# Patient Record
Sex: Female | Born: 1955
Health system: Southern US, Community
[De-identification: ages and names within clinical notes are randomized; demographics above are authoritative.]

## PROBLEM LIST (undated history)

## (undated) DIAGNOSIS — M5136 Other intervertebral disc degeneration, lumbar region: Secondary | ICD-10-CM

## (undated) DIAGNOSIS — Z9221 Personal history of antineoplastic chemotherapy: Secondary | ICD-10-CM

## (undated) DIAGNOSIS — F32A Depression, unspecified: Secondary | ICD-10-CM

## (undated) DIAGNOSIS — G8929 Other chronic pain: Secondary | ICD-10-CM

## (undated) DIAGNOSIS — F419 Anxiety disorder, unspecified: Secondary | ICD-10-CM

## (undated) DIAGNOSIS — K219 Gastro-esophageal reflux disease without esophagitis: Secondary | ICD-10-CM

## (undated) DIAGNOSIS — M1990 Unspecified osteoarthritis, unspecified site: Secondary | ICD-10-CM

## (undated) DIAGNOSIS — M5030 Other cervical disc degeneration, unspecified cervical region: Secondary | ICD-10-CM

## (undated) DIAGNOSIS — Z9889 Other specified postprocedural states: Secondary | ICD-10-CM

## (undated) DIAGNOSIS — E039 Hypothyroidism, unspecified: Secondary | ICD-10-CM

## (undated) DIAGNOSIS — M4800 Spinal stenosis, site unspecified: Secondary | ICD-10-CM

## (undated) DIAGNOSIS — M51369 Other intervertebral disc degeneration, lumbar region without mention of lumbar back pain or lower extremity pain: Secondary | ICD-10-CM

## (undated) DIAGNOSIS — M5137 Other intervertebral disc degeneration, lumbosacral region: Secondary | ICD-10-CM

## (undated) DIAGNOSIS — M51379 Other intervertebral disc degeneration, lumbosacral region without mention of lumbar back pain or lower extremity pain: Secondary | ICD-10-CM

## (undated) DIAGNOSIS — G4730 Sleep apnea, unspecified: Secondary | ICD-10-CM

## (undated) DIAGNOSIS — R112 Nausea with vomiting, unspecified: Secondary | ICD-10-CM

## (undated) DIAGNOSIS — E669 Obesity, unspecified: Secondary | ICD-10-CM

## (undated) DIAGNOSIS — F329 Major depressive disorder, single episode, unspecified: Secondary | ICD-10-CM

## (undated) DIAGNOSIS — E785 Hyperlipidemia, unspecified: Secondary | ICD-10-CM

## (undated) DIAGNOSIS — Z923 Personal history of irradiation: Secondary | ICD-10-CM

## (undated) DIAGNOSIS — Z87442 Personal history of urinary calculi: Secondary | ICD-10-CM

## (undated) DIAGNOSIS — C50919 Malignant neoplasm of unspecified site of unspecified female breast: Secondary | ICD-10-CM

## (undated) HISTORY — DX: Unspecified osteoarthritis, unspecified site: M19.90

## (undated) HISTORY — PX: FOOT FOREIGN BODY REMOVAL: SUR1116

## (undated) HISTORY — DX: Major depressive disorder, single episode, unspecified: F32.9

## (undated) HISTORY — DX: Other chronic pain: G89.29

## (undated) HISTORY — DX: Other intervertebral disc degeneration, lumbar region: M51.36

## (undated) HISTORY — DX: Other cervical disc degeneration, unspecified cervical region: M50.30

## (undated) HISTORY — DX: Hyperlipidemia, unspecified: E78.5

## (undated) HISTORY — DX: Depression, unspecified: F32.A

## (undated) HISTORY — DX: Anxiety disorder, unspecified: F41.9

## (undated) HISTORY — PX: CHOLECYSTECTOMY: SHX55

## (undated) HISTORY — PX: ABDOMINAL HYSTERECTOMY: SHX81

## (undated) HISTORY — DX: Malignant neoplasm of unspecified site of unspecified female breast: C50.919

## (undated) HISTORY — DX: Other intervertebral disc degeneration, lumbar region without mention of lumbar back pain or lower extremity pain: M51.369

## (undated) HISTORY — DX: Obesity, unspecified: E66.9

---

## 1998-07-19 ENCOUNTER — Other Ambulatory Visit: Admission: RE | Admit: 1998-07-19 | Discharge: 1998-07-19 | Payer: Self-pay | Admitting: Obstetrics & Gynecology

## 1999-12-22 ENCOUNTER — Other Ambulatory Visit: Admission: RE | Admit: 1999-12-22 | Discharge: 1999-12-22 | Payer: Self-pay | Admitting: Obstetrics & Gynecology

## 2001-03-04 ENCOUNTER — Other Ambulatory Visit: Admission: RE | Admit: 2001-03-04 | Discharge: 2001-03-04 | Payer: Self-pay | Admitting: Family Medicine

## 2002-06-11 ENCOUNTER — Other Ambulatory Visit: Admission: RE | Admit: 2002-06-11 | Discharge: 2002-06-11 | Payer: Self-pay | Admitting: Obstetrics & Gynecology

## 2003-08-21 ENCOUNTER — Other Ambulatory Visit: Admission: RE | Admit: 2003-08-21 | Discharge: 2003-08-21 | Payer: Self-pay | Admitting: Obstetrics & Gynecology

## 2004-01-05 ENCOUNTER — Ambulatory Visit (HOSPITAL_COMMUNITY): Admission: RE | Admit: 2004-01-05 | Discharge: 2004-01-05 | Payer: Self-pay | Admitting: Family Medicine

## 2004-01-12 ENCOUNTER — Ambulatory Visit (HOSPITAL_COMMUNITY): Admission: RE | Admit: 2004-01-12 | Discharge: 2004-01-12 | Payer: Self-pay | Admitting: Family Medicine

## 2004-01-19 ENCOUNTER — Ambulatory Visit (HOSPITAL_COMMUNITY): Admission: RE | Admit: 2004-01-19 | Discharge: 2004-01-19 | Payer: Self-pay | Admitting: Family Medicine

## 2005-03-14 ENCOUNTER — Other Ambulatory Visit: Admission: RE | Admit: 2005-03-14 | Discharge: 2005-03-14 | Payer: Self-pay | Admitting: Obstetrics & Gynecology

## 2005-09-08 ENCOUNTER — Ambulatory Visit (HOSPITAL_COMMUNITY): Admission: RE | Admit: 2005-09-08 | Discharge: 2005-09-08 | Payer: Self-pay | Admitting: Family Medicine

## 2006-06-11 ENCOUNTER — Ambulatory Visit: Payer: Self-pay | Admitting: Internal Medicine

## 2006-06-18 ENCOUNTER — Ambulatory Visit (HOSPITAL_COMMUNITY): Admission: RE | Admit: 2006-06-18 | Discharge: 2006-06-18 | Payer: Self-pay | Admitting: Internal Medicine

## 2006-06-18 ENCOUNTER — Ambulatory Visit: Payer: Self-pay | Admitting: Internal Medicine

## 2007-09-06 ENCOUNTER — Encounter: Admission: RE | Admit: 2007-09-06 | Discharge: 2007-09-06 | Payer: Self-pay | Admitting: Obstetrics & Gynecology

## 2010-10-21 ENCOUNTER — Ambulatory Visit (HOSPITAL_COMMUNITY)
Admission: RE | Admit: 2010-10-21 | Discharge: 2010-10-21 | Payer: Self-pay | Source: Home / Self Care | Attending: Internal Medicine | Admitting: Internal Medicine

## 2011-03-10 NOTE — Consult Note (Signed)
NAME:  Tracy Valentine, Tracy Valentine NO.:  [id number]   MEDICAL RECORD NO.:  [id number]           PATIENT TYPE:  AMB   LOCATION:                                FACILITY:  [facility]   PHYSICIAN:  Lionel December, M.D.    DATE OF BIRTH:  03-Dec-1955   DATE OF CONSULTATION:  DATE OF DISCHARGE:                                   CONSULTATION   REQUESTING PHYSICIAN:  Freddy Finner, M.D., at [facility] in [place].   REASON FOR CONSULTATION:  Rectal bleeding.   HISTORY OF PRESENT ILLNESS:  Tracy Valentine is a 55 year old Caucasian female  who states over the last year she has had a large amount of rectal bleeding  with episodes about once or twice a month. She felt initially the bleeding  was vaginal. She has been evaluated by her gynecologist. She is status post  partial hysterectomy. Apparently, a GYN exam was unremarkable. She has been  sent to a urologist as she has had hematuria, and she tells me that she is  being treated for a UTI at this time. She has noticed moderate to large  amounts of puddles of blood in the stool. She has a known history of  hemorrhoids. She states she does a lot of heavy lifting at work. She has  also noticed bright red blood on the toilet paper and streaks with wiping  after defecation. She complains of occasional low abdominal cramp-like pain  and abdominal bloating. She tells me she had a CT scan November of 2006  which was normal through her primary care physician. She has taken 2 to 3  stool softeners a day with a history of chronic constipation but generally  has a bowel movement every other day.   PAST MEDICAL HISTORY:  1. Hypercholesterolemia.  2. Anxiety.  3. Constipation.  4. Chronic UTIs are being followed by urologist.  5. Cholecystectomy.  6. Partial hysterectomy.  7. Appendectomy.   CURRENT MEDICATIONS:  1. Lipitor 5 mg daily.  2. Aspirin 81 mg daily.  3. Ambien 10 mg every night.  4. Stool softeners 2 to 3 every  night.  5. Xanax 1 mg every night.  6. Bactrim DS 1 b.i.d.   ALLERGIES:  No known drug allergies.   FAMILY HISTORY:  There was no known family history of colorectal carcinoma.  She tells me her mother did have a bowel resection due to a possible tumor.  She is unsure of exact condition. Father deceased at age 46 secondary to MI.   SOCIAL HISTORY:  Tracy Valentine has been married for 29 years. She has one  grown healthy child. 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 She denies any tobacco, alcohol or drug use.   REVIEW OF SYSTEMS:  CONSTITUTIONAL:  Weight is stable. Denies any fever or  chills. CARDIOVASCULAR:  Denies any chest pain or palpitations. PULMONARY:  Denies shortness of breath, dyspnea, cough or hemoptysis. GASTROINTESTINAL:  See HPI. GENITOURINARY:  She has a history of chronic UTIs on Bactrim  currently. History of hematuria. GASTROINTESTINAL:  See  HPI. She has  occasional rare heartburn. Denies any anorexia or early satiety. Denies any  indigestion, dysphagia or odynophagia. Denies any nausea or vomiting.  HEMATOLOGIC:  Denies any epistaxis, easy bruising or blood dyscrasias.   PHYSICAL EXAMINATION:  VITAL SIGNS:  Weight 245 pounds, height 68 inches,  temperature 98.7, blood pressure 132/80 and pulse 60.  GENERAL:  Tracy Valentine is 55 year old obese Caucasian female who is alert,  oriented, pleasant and cooperative in no acute distress.  HEENT:  Sclerae are clear, nonicteric. Conjunctivae are pink. Oral mucosa  pink and moist without any lesions.  NECK:  Supple without any masses or thyromegaly.  CHEST:  Heart regular rate and rhythm with normal S1 and S2 without murmurs,  clicks, rubs or gallops.  LUNGS:  Clear to auscultation bilaterally.  ABDOMEN:  Protuberant with positive bowel sounds x4. No bruits auscultated.  Abdomen is soft, multiple striae, nontender without any palpable mass or  hepatosplenomegaly. No rebound tenderness or guarding. Exam is  limited  secondary to patient's body habitus.  EXTREMITIES:  Without clubbing or edema bilaterally.  SKIN:  Pink, warm and dry without any rash or jaundice.   IMPRESSION:  Tracy Valentine is a 55 year old Caucasian female who over the  last year has had intermittent moderate to large amount of blood noticed in  the commode as well as on the toilet tissue. She has had a normal  gynecological exam without any evidence of bleeding. She is status post  partial hysterectomy. She has had a urological exam which has shown  hematuria and chronic UTIs. She is not sure whether this large amount of  bleeding is coming from the GI tract. However, she has noticed blood on the  toilet tissue directly after defecation as well as on the commode. She is  going to need further evaluation with colonoscopy to further delineate  etiology of her bleeding and rule out colorectal carcinoma.   PLAN:  1. Colonoscopy with Dr. Karilyn Cota in the near future. I discussed this      procedure including the risks and benefits which include but are not      limited bleeding, infection, perforation, drug reaction. She agrees      with the plan, and consent will be obtained.  2. Aspirin 81 mg daily will be held for three days prior to the exam.  3. Further recommendations pending colonoscopy.   We would like to thank Dr. Jennette Kettle for allowing [place] to participate in the care  of Tracy Valentine.      Nicholas Lose, N.P.      Lionel December, M.D.  Electronically Signed    KC/MEDQ  D:  06/11/2006  T:  06/12/2006  Job:  [id number]   cc:   Freddy Finner, M.D.  Fax: [phone]

## 2011-03-10 NOTE — Op Note (Signed)
NAME:  Tracy Valentine, Tracy Valentine             ACCOUNT NO.:  [id number]   MEDICAL RECORD NO.:  [id number]          PATIENT TYPE:  AMB   LOCATION:  DAY                           FACILITY:  [facility]   PHYSICIAN:  Lionel December, M.D.    DATE OF BIRTH:  [birth year]   DATE OF PROCEDURE:  06/18/2006  DATE OF DISCHARGE:                                 OPERATIVE REPORT   PROCEDURE:  Colonoscopy.   INDICATION:  [name] is 54 year old Caucasian female with 1/2-year history of  hematochezia.  At times she notices more than just trickle of blood.  She  has had problems with constipation has been using fiber supplement and  Colace and doing better.  Family history is negative for colorectal  carcinoma.  Procedure risks were reviewed the patient, informed consent was  obtained.   MEDS FOR CONSCIOUS SEDATION:  Demerol 50 mg IV Versed 10 mg IV.   FINDINGS:  Procedure performed in endoscopy suite.  The patient's vital  signs and O2 sat were monitored during procedure and remained stable.  The  patient was placed left lateral position.  Rectal examination performed.  No  abnormality noted on external or digital exam.  Olympus videoscope was  placed rectum and advanced under vision into sigmoid colon.  Preparation was  excellent.  Tortuous sigmoid colon and some difficulty encountered in  passing the scope to splenic flexure.  Using abdominal pressure was helpful.  Scope was finally passed into cecum which was identified by appendiceal  orifice and ileocecal valve.  Pictures taken for the record.  As the scope  was withdrawn colonic mucosa was carefully examined.  While there were no  polyps, mass or diverticular changes, there were some fine pigmentation  consistent with melanosis coli involving the most of the colon.  Rectal  mucosa similarly was normal.  Scope was retroflexed to examine anorectal  junction and small hemorrhoids noted below the dentate line.  Endoscope was  then withdrawn.  The patient tolerated the  procedure well.   FINAL DIAGNOSIS:  No evidence of polyps or colitis.  Mild changes of  melanosis coli.  External hemorrhoids.   Suspect she may have intermittent hematochezia secondary to hemorrhoids.   RECOMMENDATIONS:  She will continue fiber supplement and Colace as before.  Next, she will keep symptom diary and if she has frequent episodes of  bleeding.  She will return for OV in 2-3 months.      Lionel December, M.D.  Electronically Signed     NR/MEDQ  D:  06/18/2006  T:  06/19/2006  Job:  [id number]   cc:   Freddy Finner, M.D.  Fax: [phone]   Patrica Duel, M.D.  Fax: [phone]

## 2013-07-22 ENCOUNTER — Encounter: Payer: Self-pay | Admitting: Cardiology

## 2013-07-25 ENCOUNTER — Encounter: Payer: Self-pay | Admitting: Cardiovascular Disease

## 2013-07-25 ENCOUNTER — Ambulatory Visit (INDEPENDENT_AMBULATORY_CARE_PROVIDER_SITE_OTHER): Payer: BC Managed Care – PPO | Admitting: Cardiovascular Disease

## 2013-07-25 VITALS — BP 124/80 | HR 64 | Ht 65.0 in | Wt 249.0 lb

## 2013-07-25 DIAGNOSIS — E039 Hypothyroidism, unspecified: Secondary | ICD-10-CM | POA: Insufficient documentation

## 2013-07-25 DIAGNOSIS — I10 Essential (primary) hypertension: Secondary | ICD-10-CM | POA: Insufficient documentation

## 2013-07-25 DIAGNOSIS — R079 Chest pain, unspecified: Secondary | ICD-10-CM | POA: Insufficient documentation

## 2013-07-25 DIAGNOSIS — Z136 Encounter for screening for cardiovascular disorders: Secondary | ICD-10-CM

## 2013-07-25 DIAGNOSIS — F419 Anxiety disorder, unspecified: Secondary | ICD-10-CM | POA: Insufficient documentation

## 2013-07-25 DIAGNOSIS — F411 Generalized anxiety disorder: Secondary | ICD-10-CM
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
## 2013-07-25 NOTE — Patient Instructions (Signed)
Your physician has requested that you have an echocardiogram. Echocardiography is a painless test that uses sound waves to create images of your heart. It provides your doctor with information about the size and shape of your heart and how well your heart's chambers and valves are working. This procedure takes approximately one hour. There are no restrictions for this procedure. Continue all current medications. Follow up as needed (based on test results above)

## 2013-08-07 ENCOUNTER — Other Ambulatory Visit (INDEPENDENT_AMBULATORY_CARE_PROVIDER_SITE_OTHER): Payer: BC Managed Care – PPO

## 2013-08-07 ENCOUNTER — Encounter: Payer: Self-pay | Admitting: *Deleted

## 2013-08-07 ENCOUNTER — Other Ambulatory Visit: Payer: Self-pay

## 2013-08-07 DIAGNOSIS — R079 Chest pain, unspecified: Secondary | ICD-10-CM

## 2014-04-01 ENCOUNTER — Other Ambulatory Visit: Payer: Self-pay | Admitting: Obstetrics & Gynecology

## 2014-04-01 DIAGNOSIS — R928 Other abnormal and inconclusive findings on diagnostic imaging of breast: Secondary | ICD-10-CM

## 2014-04-09 ENCOUNTER — Ambulatory Visit
Admission: RE | Admit: 2014-04-09 | Discharge: 2014-04-09 | Disposition: A | Discharge status: Home / Self Care | Payer: Medicare Other | Source: Ambulatory Visit | Attending: Obstetrics & Gynecology | Admitting: Obstetrics & Gynecology

## 2014-04-09 ENCOUNTER — Encounter (INDEPENDENT_AMBULATORY_CARE_PROVIDER_SITE_OTHER): Payer: Self-pay

## 2014-04-09 DIAGNOSIS — R928 Other abnormal and inconclusive findings on diagnostic imaging of breast: Secondary | ICD-10-CM

## 2015-10-24 DIAGNOSIS — C569 Malignant neoplasm of unspecified ovary: Secondary | ICD-10-CM

## 2015-10-24 DIAGNOSIS — C50919 Malignant neoplasm of unspecified site of unspecified female breast: Secondary | ICD-10-CM

## 2015-10-24 DIAGNOSIS — Z9221 Personal history of antineoplastic chemotherapy: Secondary | ICD-10-CM

## 2015-10-24 HISTORY — DX: Malignant neoplasm of unspecified site of unspecified female breast: C50.919

## 2015-10-24 HISTORY — DX: Personal history of antineoplastic chemotherapy: Z92.21

## 2015-10-24 HISTORY — DX: Malignant neoplasm of unspecified ovary: C56.9

## 2015-11-04 ENCOUNTER — Other Ambulatory Visit (HOSPITAL_COMMUNITY): Payer: Self-pay | Admitting: Physical Medicine and Rehabilitation

## 2015-11-04 DIAGNOSIS — M47816 Spondylosis without myelopathy or radiculopathy, lumbar region: Secondary | ICD-10-CM

## 2015-11-17 ENCOUNTER — Ambulatory Visit (HOSPITAL_COMMUNITY)
Admission: RE | Admit: 2015-11-17 | Discharge: 2015-11-17 | Disposition: A | Discharge status: Home / Self Care | Payer: Medicare Other | Source: Ambulatory Visit | Attending: Physical Medicine and Rehabilitation | Admitting: Physical Medicine and Rehabilitation

## 2015-11-17 DIAGNOSIS — M47896 Other spondylosis, lumbar region: Secondary | ICD-10-CM | POA: Diagnosis not present

## 2015-11-17 DIAGNOSIS — M47816 Spondylosis without myelopathy or radiculopathy, lumbar region: Secondary | ICD-10-CM

## 2015-11-17 DIAGNOSIS — M4806 Spinal stenosis, lumbar region: Secondary | ICD-10-CM | POA: Diagnosis not present

## 2015-11-17 DIAGNOSIS — M545 Low back pain: Secondary | ICD-10-CM | POA: Diagnosis present

## 2015-11-30 DIAGNOSIS — J0101 Acute recurrent maxillary sinusitis: Secondary | ICD-10-CM | POA: Diagnosis not present

## 2015-11-30 DIAGNOSIS — J343 Hypertrophy of nasal turbinates: Secondary | ICD-10-CM | POA: Diagnosis not present

## 2015-11-30 DIAGNOSIS — J342 Deviated nasal septum: Secondary | ICD-10-CM | POA: Diagnosis not present

## 2015-12-16 DIAGNOSIS — M47816 Spondylosis without myelopathy or radiculopathy, lumbar region: Secondary | ICD-10-CM | POA: Diagnosis not present

## 2015-12-16 DIAGNOSIS — G894 Chronic pain syndrome: Secondary | ICD-10-CM | POA: Diagnosis not present

## 2015-12-16 DIAGNOSIS — M47812 Spondylosis without myelopathy or radiculopathy, cervical region: Secondary | ICD-10-CM | POA: Diagnosis not present

## 2015-12-16 DIAGNOSIS — Z79891 Long term (current) use of opiate analgesic: Secondary | ICD-10-CM | POA: Diagnosis not present

## 2015-12-16 DIAGNOSIS — M5416 Radiculopathy, lumbar region: Secondary | ICD-10-CM | POA: Diagnosis not present

## 2015-12-21 DIAGNOSIS — J310 Chronic rhinitis: Secondary | ICD-10-CM | POA: Diagnosis not present

## 2015-12-21 DIAGNOSIS — J343 Hypertrophy of nasal turbinates: Secondary | ICD-10-CM | POA: Diagnosis not present

## 2015-12-21 DIAGNOSIS — J342 Deviated nasal septum: Secondary | ICD-10-CM | POA: Diagnosis not present

## 2016-02-10 DIAGNOSIS — M47812 Spondylosis without myelopathy or radiculopathy, cervical region: Secondary | ICD-10-CM | POA: Diagnosis not present

## 2016-02-10 DIAGNOSIS — M47816 Spondylosis without myelopathy or radiculopathy, lumbar region: Secondary | ICD-10-CM | POA: Diagnosis not present

## 2016-02-10 DIAGNOSIS — M1711 Unilateral primary osteoarthritis, right knee: Secondary | ICD-10-CM | POA: Diagnosis not present

## 2016-02-10 DIAGNOSIS — M5416 Radiculopathy, lumbar region: Secondary | ICD-10-CM | POA: Diagnosis not present

## 2016-02-10 DIAGNOSIS — G894 Chronic pain syndrome: Secondary | ICD-10-CM | POA: Diagnosis not present

## 2016-02-28 DIAGNOSIS — Z1389 Encounter for screening for other disorder: Secondary | ICD-10-CM | POA: Diagnosis not present

## 2016-02-28 DIAGNOSIS — R7309 Other abnormal glucose: Secondary | ICD-10-CM | POA: Diagnosis not present

## 2016-02-28 DIAGNOSIS — E039 Hypothyroidism, unspecified: Secondary | ICD-10-CM | POA: Diagnosis not present

## 2016-02-28 DIAGNOSIS — E782 Mixed hyperlipidemia: Secondary | ICD-10-CM | POA: Diagnosis not present

## 2016-02-28 DIAGNOSIS — Z6841 Body Mass Index (BMI) 40.0 and over, adult: Secondary | ICD-10-CM | POA: Diagnosis not present

## 2016-04-05 DIAGNOSIS — M47816 Spondylosis without myelopathy or radiculopathy, lumbar region: Secondary | ICD-10-CM | POA: Diagnosis not present

## 2016-04-05 DIAGNOSIS — M5416 Radiculopathy, lumbar region: Secondary | ICD-10-CM | POA: Diagnosis not present

## 2016-04-05 DIAGNOSIS — M47812 Spondylosis without myelopathy or radiculopathy, cervical region: Secondary | ICD-10-CM | POA: Diagnosis not present

## 2016-04-05 DIAGNOSIS — G894 Chronic pain syndrome: Secondary | ICD-10-CM | POA: Diagnosis not present

## 2016-05-24 DIAGNOSIS — G894 Chronic pain syndrome: Secondary | ICD-10-CM | POA: Diagnosis not present

## 2016-05-24 DIAGNOSIS — M47816 Spondylosis without myelopathy or radiculopathy, lumbar region: Secondary | ICD-10-CM | POA: Diagnosis not present

## 2016-05-24 DIAGNOSIS — M47812 Spondylosis without myelopathy or radiculopathy, cervical region: Secondary | ICD-10-CM | POA: Diagnosis not present

## 2016-05-24 DIAGNOSIS — M5416 Radiculopathy, lumbar region: Secondary | ICD-10-CM | POA: Diagnosis not present

## 2016-05-26 DIAGNOSIS — J302 Other seasonal allergic rhinitis: Secondary | ICD-10-CM | POA: Diagnosis not present

## 2016-05-26 DIAGNOSIS — Z6841 Body Mass Index (BMI) 40.0 and over, adult: Secondary | ICD-10-CM | POA: Diagnosis not present

## 2016-05-26 DIAGNOSIS — J309 Allergic rhinitis, unspecified: Secondary | ICD-10-CM | POA: Diagnosis not present

## 2016-05-26 DIAGNOSIS — J0100 Acute maxillary sinusitis, unspecified: Secondary | ICD-10-CM | POA: Diagnosis not present

## 2016-07-19 DIAGNOSIS — Z1231 Encounter for screening mammogram for malignant neoplasm of breast: Secondary | ICD-10-CM | POA: Diagnosis not present

## 2016-07-19 DIAGNOSIS — Z6841 Body Mass Index (BMI) 40.0 and over, adult: Secondary | ICD-10-CM | POA: Diagnosis not present

## 2016-07-19 DIAGNOSIS — Z01419 Encounter for gynecological examination (general) (routine) without abnormal findings: Secondary | ICD-10-CM | POA: Diagnosis not present

## 2016-07-20 DIAGNOSIS — M47816 Spondylosis without myelopathy or radiculopathy, lumbar region: Secondary | ICD-10-CM | POA: Diagnosis not present

## 2016-07-20 DIAGNOSIS — M47812 Spondylosis without myelopathy or radiculopathy, cervical region: Secondary | ICD-10-CM | POA: Diagnosis not present

## 2016-07-20 DIAGNOSIS — M5416 Radiculopathy, lumbar region: Secondary | ICD-10-CM | POA: Diagnosis not present

## 2016-07-20 DIAGNOSIS — G894 Chronic pain syndrome: Secondary | ICD-10-CM | POA: Diagnosis not present

## 2016-07-24 ENCOUNTER — Other Ambulatory Visit: Payer: Self-pay | Admitting: Obstetrics & Gynecology

## 2016-07-24 DIAGNOSIS — R928 Other abnormal and inconclusive findings on diagnostic imaging of breast: Secondary | ICD-10-CM

## 2016-07-26 ENCOUNTER — Ambulatory Visit
Admission: RE | Admit: 2016-07-26 | Discharge: 2016-07-26 | Disposition: A | Discharge status: Home / Self Care | Payer: Medicare Other | Source: Ambulatory Visit | Attending: Obstetrics & Gynecology | Admitting: Obstetrics & Gynecology

## 2016-07-26 ENCOUNTER — Other Ambulatory Visit: Payer: Self-pay | Admitting: Obstetrics & Gynecology

## 2016-07-26 DIAGNOSIS — R928 Other abnormal and inconclusive findings on diagnostic imaging of breast: Secondary | ICD-10-CM | POA: Diagnosis not present

## 2016-07-26 DIAGNOSIS — W64XXXA Exposure to other animate mechanical forces, initial encounter: Secondary | ICD-10-CM | POA: Diagnosis not present

## 2016-07-26 DIAGNOSIS — I10 Essential (primary) hypertension: Secondary | ICD-10-CM | POA: Diagnosis not present

## 2016-07-26 DIAGNOSIS — N6489 Other specified disorders of breast: Secondary | ICD-10-CM | POA: Diagnosis not present

## 2016-07-26 DIAGNOSIS — Z6841 Body Mass Index (BMI) 40.0 and over, adult: Secondary | ICD-10-CM | POA: Diagnosis not present

## 2016-07-26 DIAGNOSIS — N6320 Unspecified lump in the left breast, unspecified quadrant: Secondary | ICD-10-CM

## 2016-07-26 DIAGNOSIS — L0291 Cutaneous abscess, unspecified: Secondary | ICD-10-CM | POA: Diagnosis not present

## 2016-07-28 ENCOUNTER — Ambulatory Visit
Admission: RE | Admit: 2016-07-28 | Discharge: 2016-07-28 | Disposition: A | Discharge status: Home / Self Care | Payer: Medicare Other | Source: Ambulatory Visit | Attending: Obstetrics & Gynecology | Admitting: Obstetrics & Gynecology

## 2016-07-28 ENCOUNTER — Other Ambulatory Visit: Payer: Self-pay | Admitting: Obstetrics & Gynecology

## 2016-07-28 DIAGNOSIS — N6320 Unspecified lump in the left breast, unspecified quadrant: Secondary | ICD-10-CM

## 2016-07-28 DIAGNOSIS — C50412 Malignant neoplasm of upper-outer quadrant of left female breast: Secondary | ICD-10-CM | POA: Diagnosis not present

## 2016-07-28 DIAGNOSIS — N6489 Other specified disorders of breast: Secondary | ICD-10-CM | POA: Diagnosis not present

## 2016-07-28 DIAGNOSIS — Z170 Estrogen receptor positive status [ER+]: Secondary | ICD-10-CM | POA: Diagnosis not present

## 2016-07-31 ENCOUNTER — Other Ambulatory Visit: Payer: Self-pay | Admitting: Obstetrics & Gynecology

## 2016-07-31 DIAGNOSIS — D493 Neoplasm of unspecified behavior of breast: Secondary | ICD-10-CM

## 2016-08-03 ENCOUNTER — Encounter (HOSPITAL_COMMUNITY): Payer: Self-pay | Admitting: Hematology & Oncology

## 2016-08-03 ENCOUNTER — Encounter (HOSPITAL_COMMUNITY): Payer: Medicare Other | Attending: Hematology & Oncology | Admitting: Hematology & Oncology

## 2016-08-03 VITALS — BP 143/62 | HR 58 | Temp 98.4°F | Resp 18 | Ht 65.0 in | Wt 251.2 lb

## 2016-08-03 DIAGNOSIS — Z170 Estrogen receptor positive status [ER+]: Secondary | ICD-10-CM | POA: Diagnosis not present

## 2016-08-03 DIAGNOSIS — Z23 Encounter for immunization: Secondary | ICD-10-CM

## 2016-08-03 DIAGNOSIS — Z0000 Encounter for general adult medical examination without abnormal findings: Secondary | ICD-10-CM

## 2016-08-03 DIAGNOSIS — C50412 Malignant neoplasm of upper-outer quadrant of left female breast: Secondary | ICD-10-CM | POA: Diagnosis not present

## 2016-08-03 MED ORDER — INFLUENZA VAC SPLIT QUAD 0.5 ML IM SUSY
0.5000 mL | PREFILLED_SYRINGE | Freq: Once | INTRAMUSCULAR | Status: AC
  Administered 2016-08-03: 0.5 mL via INTRAMUSCULAR
  Filled 2016-08-03: qty 0.5

## 2016-08-03 NOTE — Patient Instructions (Signed)
[facility] at [facility] Discharge Instructions  RECOMMENDATIONS MADE BY THE CONSULTANT AND ANY TEST RESULTS WILL BE SENT TO YOUR REFERRING PHYSICIAN.  You saw Dr. Penland today.  Thank you for choosing [facility] at [facility] to provide your oncology and hematology care.  To afford each patient quality time with our provider, please arrive at least 15 minutes before your scheduled appointment time.   Beginning January 23rd 2017 lab work for the Cancer Center will be done in the  Main lab at [facility] on 1st floor. If you have a lab appointment with the Cancer Center please come in thru the  Main Entrance and check in at the main information desk  You need to re-schedule your appointment should you arrive 10 or more minutes late.  We strive to give you quality time with our providers, and arriving late affects you and other patients whose appointments are after yours.  Also, if you no show three or more times for appointments you may be dismissed from the clinic at the providers discretion.     Again, thank you for choosing [facility].  Our hope is that these requests will decrease the amount of time that you wait before being seen by our physicians.       _____________________________________________________________  Should you have questions after your visit to [facility], please contact our office at [phone] between the hours of 8:30 a.m. and 4:30 p.m.  Voicemails left after 4:30 p.m. will not be returned until the following business day.  For prescription refill requests, have your pharmacy contact our office.         Resources For Cancer Patients and their Caregivers ? American Cancer Society: Can assist with transportation, wigs, general needs, runs Look Good Feel Better.        1-888-227-6333 ? Cancer Care: Provides financial assistance, online support groups, medication/co-pay assistance.   1-800-813-HOPE (4673) ? [facility] Assists [place] cancer patients and their families through emotional , educational and financial support.  336-427-4357 ? [place] DSS Where to apply for food stamps, Medicaid and utility assistance. 336-342-1394 ? [facility]: Transportation to medical appointments. 336-347-2287 ? Social Security Administration: May apply for disability if have a Stage IV cancer. 336-342-7796 1-800-772-1213 ? [place] Aging, Disability and Transit Services: Assists with nutrition, care and transit needs. 336-349-2343  Cancer Center Support Programs: @10RELATIVEDAYS@ > Cancer Support Group  2nd Tuesday of the month 1pm-2pm, Journey Room  > Creative Journey  3rd Tuesday of the month 1130am-1pm, Journey Room  > Look Good Feel Better  1st Wednesday of the month 10am-12 noon, Journey Room (Call American Cancer Society to register 1-800-395-5775)    

## 2016-08-03 NOTE — Progress Notes (Signed)
Tracy Valentine presents today for injection per MD orders. Flu Vaccine administered IM in right Upper Arm. Administration without incident. Patient tolerated well.

## 2016-08-03 NOTE — Progress Notes (Signed)
[place]  CONSULT NOTE  Patient Care Team: [employer], MD as PCP - General (Internal Medicine)  CHIEF COMPLAINTS/PURPOSE OF CONSULTATION:  Left breast cancer ER+ PR- HER 2 -    Breast cancer of upper-outer quadrant of left female breast ([employer])   07/26/2016 Mammogram    Area of developing asymmetry with distortion located within the upper-outer quadrant left breast. Tissue sampling via tomosynthesis guided biopsy is recommended and will be scheduled.  RECOMMENDATION: Left breast tomosynthesis guided biopsy. This has been scheduled for 07/28/2016.       07/28/2016 Initial Biopsy    Coil shaped clip corresponds to the asymmetry in the outer left breast. Clip is well positioned at the biopsy site. 2. X shaped clip corresponds to the ASYMMETRY/DISTORTION in the upper-outer quadrant of the left breast. Clip is positioned approximately 1 cm medial to the center of the biopsy cavity      07/28/2016 Pathology Results    Estrogen Receptor: 80%, POSITIVE, MODERATE STAINING INTENSITY Progesterone Receptor: 0%, NEGATIVE Proliferation Marker Ki67: 70% HER 2 negative by FISH Breast, left, needle core biopsy, upper outer quadrant - INVASIVE DUCTAL CARCINOMA, SEE COMMENT. - HIGH GRADE DUCTAL CARCINOMA IN SITU WITH NECROSIS.        HISTORY OF PRESENTING ILLNESS:  Tracy Valentine 60 y.o. female is here for a consultation due to left breast cancer ER+/PR-/HER2-. Ki67 is 70%.   She notes that this was diagnosed on routine mammography. There is an area of architectural distortion left breast upper outer quadrant and a second area in the lower breast. Both areas were biopsied. The area in the left upper outer quadrant was consistent with invasive ductal carcinoma grade 3 ER positive PR negative HER-2/neu negative with a Ki-67 of 70%. Patient denies any history of breast pain, breast mass or nipple discharge. No family history of breast cancer. There is a family  history of ovarian and uterine cancer. She has seen Dr. Brantley Stage in consultation. She denies feeling a mass in her breast.  She will meet with Dr. Brantley Stage again on Monday. She is scheduled for ultrasound of the axillae prior to her appointment.   She notes that she feels her mood is ok. She has a lot of questions and wished to be seen prior to her appointment Monday.    MEDICAL HISTORY:  Past Medical History:  Diagnosis Date  . Anxiety   . Breast cancer ([employer])   . Chronic pain   . DDD (degenerative disc disease), cervical   . DDD (degenerative disc disease), lumbar   . Depression   . Hyperlipidemia   . Hypertension   . Obesity   . Osteoarthritis     SURGICAL HISTORY: Past Surgical History:  Procedure Laterality Date  . CHOLECYSTECTOMY      SOCIAL HISTORY: Social History   Social History  . Marital status: Married    Spouse name: N/A  . Number of children: N/A  . Years of education: N/A   Occupational History  . Not on file.   Social History Main Topics  . Smoking status: Never Smoker  . Smokeless tobacco: Never Used  . Alcohol use No  . Drug use: No  . Sexual activity: Not on file     Comment: married-1 grown son   Other Topics Concern  . Not on file   Social History Narrative  . No narrative on file  1 child; No grandchildren  Never smoked No alcohol Has pets (cat and three dogs)  FAMILY HISTORY: Family History  Problem Relation Age of Onset  . COPD Mother   . Heart attack Father   . Diabetes Sister   . Heart disease Sister   . Kidney cancer Brother   . Breast cancer Cousin   . Kidney failure Sister   . Thyroid disease Sister     Half sister and 2 half brother. 1 full brother. No Breast cancer in immediate family. Paternal cousin had breast cancer. Mother diagnosed with ovarian cancer at 49 years. Father passed from heart attack.   ALLERGIES:  is allergic to amoxicillin; darvocet [propoxyphene n-acetaminophen]; and doxycycline.  MEDICATIONS:    Current Outpatient Prescriptions  Medication Sig Dispense Refill  . ALPRAZolam (XANAX) 1 MG tablet Take 1 mg by mouth 4 (four) times daily as needed for sleep.    [name] Kitchen aspirin 325 MG tablet Take 325 mg by mouth daily.    . Cholecalciferol (VITAMIN D) 2000 UNITS CAPS Take 2,000 Units by mouth daily.    [name] Kitchen docusate sodium (COLACE) 50 MG capsule Take 100 mg by mouth 2 (two) times daily.    . fluticasone (FLONASE) 50 MCG/ACT nasal spray Place 2 sprays into the nose daily.    [name] Kitchen ipratropium (ATROVENT) 0.03 % nasal spray   4  . KRILL OIL PO Take 1 capsule by mouth daily.    . lansoprazole (PREVACID) 15 MG capsule Take 15 mg by mouth 2 (two) times daily.     [name] Kitchen levothyroxine (SYNTHROID, LEVOTHROID) 100 MCG tablet Take 100 mcg by mouth daily.  5  . montelukast (SINGULAIR) 10 MG tablet Take 10 mg by mouth daily.  11  . Oxycodone HCl 10 MG TABS   0  . oxyCODONE-acetaminophen (PERCOCET) 10-325 MG per tablet Take 1 tablet by mouth every 4 (four) hours as needed for pain.    [name] Kitchen senna (SENOKOT) 8.6 MG TABS tablet Take 1 tablet by mouth at bedtime as needed.    . simvastatin (ZOCOR) 20 MG tablet Take 20 mg by mouth every evening.    . venlafaxine (EFFEXOR) 75 MG tablet Take 75 mg by mouth 2 (two) times daily.    [name] Kitchen zolpidem (AMBIEN) 10 MG tablet Take 10 mg by mouth at bedtime.     Current Facility-Administered Medications  Medication Dose Route Frequency Provider Last Rate Last Dose  . Influenza vac split quadrivalent PF (FLUARIX) injection 0.5 mL  0.5 mL Intramuscular Once Patrici Ranks, MD        Review of Systems  Constitutional: Negative.   HENT: Negative.   Eyes: Negative.   Respiratory: Negative.   Cardiovascular: Negative.   Gastrointestinal: Negative.   Genitourinary: Negative.   Musculoskeletal: Negative.   Skin: Negative.   Neurological: Negative.   Endo/Heme/Allergies: Negative.   Psychiatric/Behavioral: Negative.   All other systems reviewed and are negative. 14 point ROS was done  and is otherwise as detailed above or in HPI   PHYSICAL EXAMINATION: ECOG PERFORMANCE STATUS: 0 - Asymptomatic  Vitals:   08/03/16 1658  BP: (!) 143/62  Pulse: (!) 58  Resp: 18  Temp: 98.4 F (36.9 C)   Filed Weights   08/03/16 1658  Weight: 251 lb 3.2 oz (113.9 kg)   Physical Exam  Constitutional: She is oriented to person, place, and time and well-developed, well-nourished, and in no distress.  HENT:  Head: Normocephalic and atraumatic.  Nose: Nose normal.  Mouth/Throat: Oropharynx is clear and moist. No oropharyngeal exudate.  Eyes: Conjunctivae and EOM are normal. Pupils are equal, round,  and reactive to light. Right eye exhibits no discharge. Left eye exhibits no discharge. No scleral icterus.  Neck: Normal range of motion. Neck supple. No tracheal deviation present. No thyromegaly present.  Cardiovascular: Normal rate, regular rhythm and normal heart sounds.  Exam reveals no gallop and no friction rub.   No murmur heard. Pulmonary/Chest: Effort normal and breath sounds normal. She has no wheezes. She has no rales.  Abdominal: Soft. Bowel sounds are normal. She exhibits no distension and no mass. There is no tenderness. There is no rebound and no guarding.  Musculoskeletal: Normal range of motion. She exhibits no edema.  Lymphadenopathy:    She has no cervical adenopathy.  Neurological: She is alert and oriented to person, place, and time. She has normal reflexes. No cranial nerve deficit. Gait normal. Coordination normal.  Skin: Skin is warm and dry. No rash noted.  Psychiatric: Mood, memory, affect and judgment normal.  Nursing note and vitals reviewed. Minor bruising at prior biopsy sites  LABORATORY DATA:  I have reviewed the data as listed No results found for: WBC, HGB, HCT, MCV, PLT CMP  No results found for: NA, K, CL, CO2, GLUCOSE, BUN, CREATININE, CALCIUM, PROT, ALBUMIN, AST, ALT, ALKPHOS, BILITOT, GFRNONAA, GFRAA   RADIOGRAPHIC STUDIES: I have personally  reviewed the radiological images as listed and agreed with the findings in the report.  Study Result   CLINICAL DATA:  Status post 2 stereotactic guided biopsies of the left breast earlier today.  EXAM: DIAGNOSTIC LEFT MAMMOGRAM POST STEREOTACTIC BIOPSY  COMPARISON:  Previous exam(s).  FINDINGS: Mammographic images were obtained following stereotactic guided biopsy of an asymmetry within the outer left breast and an asymmetry/distortion in the upper outer quadrant of the left breast. Coil shaped clip corresponds to the asymmetry in the outer left breast. X shaped clip corresponds to the asymmetry/distortion in the upper-outer quadrant of the left breast.  IMPRESSION: Postprocedure mammogram for clip placement.  1. Coil shaped clip corresponds to the asymmetry in the outer left breast. Clip is well positioned at the biopsy site. 2. X shaped clip corresponds to the ASYMMETRY/DISTORTION in the upper-outer quadrant of the left breast. Clip is positioned approximately 1 cm medial to the center of the biopsy cavity.  Final Assessment: Post Procedure Mammograms for Marker Placement   Electronically Signed   By: Franki Cabot M.D.   On: 07/28/2016 11:54   Addendum   ADDENDUM REPORT: 07/31/2016 13:53  ADDENDUM: Pathology revealed BENIGN BREAST TISSUE of the Left breast, outer. GRADE III INVASIVE DUCTAL CARCINOMA, HIGH GRADE DUCTAL CARCINOMA IN SITU WITH NECROSIS of the Left breast, upper outer quadrant. This was found to be concordant by Dr. Franki Cabot. Pathology results were discussed with the patient by telephone. The patient reported doing well after the biopsies with tenderness at the sites. Post biopsy instructions and care were reviewed and questions were answered. The patient was encouraged to call [facility] for any additional concerns. Surgical consultation has been arranged with Dr. Erroll Luna at [facility]  on August 07, 2016. The patient is scheduled for a Left axillary ultrasound at [facility] on August 07, 2016.  Pathology results reported by Tracy Purser, RN on 07/31/2016.   Electronically Signed   By: Franki Cabot M.D.   On: 07/31/2016 13:53   Addended by Michiel Cowboy, MD on 07/31/2016 2:04 PM    Study Result   CLINICAL DATA:  Patient with a left breast asymmetry/distortion presenting for stereotactic guided  biopsy.  EXAM: LEFT BREAST STEREOTACTIC CORE NEEDLE BIOPSY x2  COMPARISON:  Previous exams.  FINDINGS: The patient and I discussed the procedure of stereotactic-guided biopsy including benefits and alternatives. We discussed the high likelihood of a successful procedure. We discussed the risks of the procedure including infection, bleeding, tissue injury, clip migration, and inadequate sampling. Informed written consent was given. The usual time out protocol was performed immediately prior to the procedure.  Using sterile technique and 1% Lidocaine as local anesthetic, under stereotactic guidance, a 9 gauge vacuum assisted device was used to perform core needle biopsy of an asymmetry in the outer left breast using a superior approach.  At the conclusion of the procedure, a COIL SHAPED tissue marker was placed at the biopsy site. Postprocedure diagnostic mammogram showed the coil shaped clip to correspond to the site of a left breast asymmetry identified on the CC projection, but not corresponding to the additional area of asymmetry/distortion in the upper left breast.  As such, patient was prepped for a second stereotactic guided biopsy for the additional asymmetry/distortion in the upper left breast.  Using sterile technique and 1% lidocaine as local anesthetic, under stereotactic guidance, a 9 gauge vacuum assisted device was used to perform core needle biopsy of the asymmetry/distortion in the upper-outer quadrant of the left breast  using a lateral approach.  At the conclusion of the procedure, an X SHAPED tissue marker was placed at the biopsy site. Postprocedure mammogram showed the X shaped clip to correspond to the site of the ASYMMETRY/DISTORTION in the upper-outer quadrant of the left breast, approximately 1 cm medial to the center of the biopsy cavity.  IMPRESSION: 1. Stereotactic guided biopsy of the asymmetry in the outer left breast. COIL SHAPED tissue marker placed at the biopsy site. 2. Stereotactic guided biopsy of the additional ASYMMETRY/DISTORTION in the upper-outer quadrant of the left breast. X shaped tissue marker placed at the biopsy site. This biopsy clip is approximately 1 cm medial to the center of the biopsy site.  Electronically Signed: By: Franki Cabot M.D. On: 07/28/2016 11:34      PATHOLOGY:    ASSESSMENT & PLAN:  L breast Cancer ER+ PR- HER 2 -  Patient is pleasant and well. We discussed pathology reports. I explained to her that her biopsy revealed invasive ductal carcinoma and DCIS, and I elaborated on what that means. I explained to Tracy Valentine that she will have an ultrasound to determine if there are any enlarged lymph nodes. She understands that even if the ultrasound does not detect enlarged lymph nodes, she will still have a sentinel node biopsy, and we discussed the process for that. Patient and I also discussed the lack of evidence to support mastectomy over lumpectomy in regards to survival if she is a candidate for lumpectomy. She understands that if she is a candidate for breast conservation she will need radiation post operatively.   In addition, we dicussed ER+/PR-/HER- breast cancer. I explained that we may send oncotype or mammoprint after her surgery to determine if she needs chemotherapy. She understands that even if she does not need chemotherapy, she will still have radiation.  We addressed the role of endocrine therapy. We discussed timing of endocrine therapy.     Patient was given a comprehensive breast cancer handbook to take home. She was also given contact information for our patient navigator, Tracy Valentine, and told to contact her if she has any additional questions.   I will talk with Tracy Valentine and determine if Tracy Valentine  is a candidate for genetic counseling, given her mother's history of ovarian cancer.   Follow up with patient post-operatively once pathology is available for further recommendations regarding additional therapy.    This document serves as a record of services personally performed by Ancil Linsey, MD. It was created on her behalf by Elmyra Ricks, a trained medical scribe. The creation of this record is based on the scribe's personal observations and the provider's statements to them. This document has been checked and approved by the attending provider.  I have reviewed the above documentation for accuracy and completeness and I agree with the above.  This note was electronically signed.    Molli Hazard, MD  08/03/2016 5:23 PM

## 2016-08-07 ENCOUNTER — Ambulatory Visit
Admission: RE | Admit: 2016-08-07 | Discharge: 2016-08-07 | Disposition: A | Discharge status: Home / Self Care | Payer: Medicare Other | Source: Ambulatory Visit | Attending: Obstetrics & Gynecology | Admitting: Obstetrics & Gynecology

## 2016-08-07 ENCOUNTER — Other Ambulatory Visit: Payer: Self-pay | Admitting: Obstetrics & Gynecology

## 2016-08-07 ENCOUNTER — Ambulatory Visit: Payer: Self-pay | Admitting: Surgery

## 2016-08-07 DIAGNOSIS — D493 Neoplasm of unspecified behavior of breast: Secondary | ICD-10-CM

## 2016-08-07 DIAGNOSIS — D0512 Intraductal carcinoma in situ of left breast: Secondary | ICD-10-CM | POA: Diagnosis not present

## 2016-08-07 DIAGNOSIS — C50912 Malignant neoplasm of unspecified site of left female breast: Secondary | ICD-10-CM | POA: Diagnosis not present

## 2016-08-07 DIAGNOSIS — L0390 Cellulitis, unspecified: Secondary | ICD-10-CM | POA: Diagnosis not present

## 2016-08-07 NOTE — H&P (Signed)
Tracy Valentine 08/07/2016 2:13 PM Location: [facility] Patient #: [id number] DOB: October 13, 1956 Married / Language: English / Race: White Female  History of Present Illness Tracy Valentine A. Tracy Wojtas MD; 08/07/2016 2:53 PM) Patient words: Patient sent at the request of Dr. Evette Cristal for a left breast mammographic abnormality. This was picked up on screening mammogram. There is an area of architectural distortion left breast upper outer quadrant and a second area in the lower breast. Both areas were biopsied. The area in the left upper outer quadrant was consistent with invasive ductal carcinoma grade 3 ER positive PR negative HER-2/neu negative with a Ki-67 of 70%. Patient denies any history of breast pain, breast mass or nipple discharge. No family history of breast cancer. There is a family history of ovarian and uterine cancer.                       Study Result  CLINICAL DATA: Recall from screening mammography. EXAM: 2D DIGITAL DIAGNOSTIC LEFT MAMMOGRAM WITH CAD AND ADJUNCT TOMO ULTRASOUND LEFT BREAST COMPARISON: Previous exam(s). ACR Breast Density Category b: There are scattered areas of fibroglandular density. FINDINGS: Additional views of the left breast with tomosynthesis demonstrate a persistent area of asymmetry with distortion to be present within the anterior portion of the upper-outer quadrant of the left breast at approximately the 1 to 2 o'clock position. Mammographic images were processed with CAD. On physical exam, there is no discrete palpable abnormality in the area of mammographic concern. Targeted ultrasound is performed, showing no definite ultrasound correlate for the mammographic finding. IMPRESSION: Area of developing asymmetry with distortion located within the upper-outer quadrant left breast. Tissue sampling via tomosynthesis guided biopsy is recommended and will be scheduled. RECOMMENDATION: Left breast tomosynthesis  guided biopsy. This has been scheduled for 07/28/2016. I have discussed the findings and recommendations with the patient. Results were also provided in writing at the conclusion of the visit. If applicable, a reminder letter will be sent to the patient regarding the next appointment. BI-RADS CATEGORY 4: Suspicious. Electronically Signed By: Altamese Cabal M.D. On: 07/26/2016 12:45            ADDITIONAL INFORMATION: 2. FLUORESCENCE IN-SITU HYBRIDIZATION Results: HER2 - NEGATIVE RATIO OF HER2/CEP17 SIGNALS 1.03 AVERAGE HER2 COPY NUMBER PER CELL 2.00 Reference Range: NEGATIVE HER2/CEP17 Ratio <2.0 and average HER2 copy number <4.0 EQUIVOCAL HER2/CEP17 Ratio <2.0 and average HER2 copy number >=4.0 and <6.0 POSITIVE HER2/CEP17 Ratio >=2.0 or <2.0 and average HER2 copy number >=6.0 Claudette Laws MD Pathologist, Electronic Signature ( Signed 08/03/2016) 2. PROGNOSTIC INDICATORS Results: IMMUNOHISTOCHEMICAL AND MORPHOMETRIC ANALYSIS PERFORMED MANUALLY Estrogen Receptor: 80%, POSITIVE, MODERATE STAINING INTENSITY Progesterone Receptor: 0%, NEGATIVE Proliferation Marker Ki67: 70% COMMENT: The negative hormone receptor study(ies) in this case has an internal positive control. REFERENCE RANGE ESTROGEN RECEPTOR NEGATIVE 0% POSITIVE =>1% REFERENCE RANGE PROGESTERONE RECEPTOR 1 of 3 FINAL for Tracy, Valentine ([id number]) ADDITIONAL INFORMATION:(continued) NEGATIVE 0% POSITIVE =>1% All controls stained appropriately Enid Cutter MD Pathologist, Electronic Signature ( Signed 08/01/2016) FINAL DIAGNOSIS Diagnosis 1. Breast, left, needle core biopsy, outer - BENIGN BREAST TISSUE, SEE COMMENT. - NO MALIGNANCY IDENTIFIED. 2. Breast, left, needle core biopsy, upper outer quadrant - INVASIVE DUCTAL CARCINOMA, SEE COMMENT. - HIGH GRADE DUCTAL CARCINOMA IN SITU WITH NECROSIS.  The patient is a 60 year old female.   Other Problems Tracy Valentine, CMA; 08/07/2016 2:13  PM) Anxiety Disorder Arthritis Back Pain Cholelithiasis Depression Gastroesophageal Reflux Disease Hemorrhoids Hypercholesterolemia Kidney Stone Migraine Headache Sleep Apnea  Thyroid Disease  Past Surgical History Tracy Valentine, CMA; 08/07/2016 2:13 PM) Breast Biopsy Left. Gallbladder Surgery - Open Hysterectomy (not due to cancer) - Partial  Diagnostic Studies History Tracy Valentine, CMA; 08/07/2016 2:13 PM) Colonoscopy >10 years ago Mammogram within last year Pap Smear 1-5 years ago  Allergies Tracy Valentine, CMA; 08/07/2016 2:14 PM) Darvocet A500 *ANALGESICS - OPIOID* Azithromycin *CHEMICALS* Amoxicillin *PENICILLINS* Doxycycline *DERMATOLOGICALS*  Medication History Tracy Valentine, CMA; 08/07/2016 2:16 PM) Levothyroxine Sodium (100MCG Tablet, Oral) Active. Montelukast Sodium (10MG Tablet, Oral) Active. Simvastatin (20MG Tablet, Oral) Active. ALPRAZolam (1MG Tablet, Oral) Active. Effexor (75MG Tablet, Oral two times daily) Active. Ambien (10MG Tablet, Oral) Active. OxyCODONE HCl (10MG Tablet, Oral) Active. Aspirin (81MG Tablet, Oral) Active. Vitamin D3 (1000UNIT Tablet, Oral) Active. Stool Softener (100MG Capsule, Oral) Active. Medications Reconciled  Social History Tracy Valentine, [place]; 08/07/2016 2:13 PM) Caffeine use Tea. No alcohol use No drug use Tobacco use Never smoker.  Family History Tracy Valentine, [place]; 08/07/2016 2:13 PM) Alcohol Abuse Mother. Arthritis Father. Cervical Cancer Mother. Depression Mother. Diabetes Mellitus Mother. Heart Disease Father. Ovarian Cancer Mother. Respiratory Condition Mother.  Pregnancy / Birth History Tracy Valentine, CMA; 08/07/2016 2:13 PM) Age at menarche 16 years. Age of menopause 33-50 Gravida 1 Maternal age 51-20 Para 1     Review of Systems Tracy Valentine CMA; 08/07/2016 2:13 PM) General Not Present- Appetite Loss, Chills, Fatigue, Fever, Night Sweats, Weight Gain  and Weight Loss. Skin Not Present- Change in Wart/Mole, Dryness, Hives, Jaundice, New Lesions, Non-Healing Wounds, Rash and Ulcer. HEENT Present- Seasonal Allergies. Not Present- Earache, Hearing Loss, Hoarseness, Nose Bleed, Oral Ulcers, Ringing in the Ears, Sinus Pain, Sore Throat, Visual Disturbances, Wears glasses/contact lenses and Yellow Eyes. Respiratory Present- Snoring. Not Present- Bloody sputum, Chronic Cough, Difficulty Breathing and Wheezing. Breast Not Present- Breast Mass, Breast Pain, Nipple Discharge and Skin Changes. Cardiovascular Not Present- Chest Pain, Difficulty Breathing Lying Down, Leg Cramps, Palpitations, Rapid Heart Rate, Shortness of Breath and Swelling of Extremities. Gastrointestinal Present- Constipation. Not Present- Abdominal Pain, Bloating, Bloody Stool, Change in Bowel Habits, Chronic diarrhea, Difficulty Swallowing, Excessive gas, Gets full quickly at meals, Hemorrhoids, Indigestion, Nausea, Rectal Pain and Vomiting. Female Genitourinary Not Present- Frequency, Nocturia, Painful Urination, Pelvic Pain and Urgency. Musculoskeletal Present- Back Pain and Joint Stiffness. Not Present- Joint Pain, Muscle Pain, Muscle Weakness and Swelling of Extremities. Neurological Present- Tingling. Not Present- Decreased Memory, Fainting, Headaches, Numbness, Seizures, Tremor, Trouble walking and Weakness. Psychiatric Present- Anxiety and Depression. Not Present- Bipolar, Change in Sleep Pattern, Fearful and Frequent crying. Hematology Present- Easy Bruising. Not Present- Blood Thinners, Excessive bleeding, Gland problems, HIV and Persistent Infections.  Vitals Tracy Valentine CMA; 08/07/2016 2:16 PM) 08/07/2016 2:16 PM Weight: 254.8 lb Height: 65in Body Surface Area: 2.19 m Body Mass Index: 42.4 kg/m  Temp.: 97.50F(Temporal)  Pulse: 76 (Regular)  BP: 130/76 (Sitting, Left Arm, Standard)      Physical Exam (Rane Blitch A. Neve Branscomb MD; 08/07/2016 2:53  PM)  General Mental Status-Alert. General Appearance-Consistent with stated age. Hydration-Well hydrated. Voice-Normal.  Head and Neck Head-normocephalic, atraumatic with no lesions or palpable masses. Trachea-midline. Thyroid Gland Characteristics - normal size and consistency.  Eye Eyeball - Bilateral-Extraocular movements intact. Sclera/Conjunctiva - Bilateral-No scleral icterus.  Chest and Lung Exam Chest and lung exam reveals -quiet, even and easy respiratory effort with no use of accessory muscles and on auscultation, normal breath sounds, no adventitious sounds and normal vocal resonance. Inspection Chest Wall - Normal. Back - normal.  Breast Breast - Left-Symmetric, Non Tender,  No Biopsy scars, no Dimpling, No Inflammation, No Lumpectomy scars, No Mastectomy scars, No Peau d' Orange. Breast - Right-Symmetric, Non Tender, No Biopsy scars, no Dimpling, No Inflammation, No Lumpectomy scars, No Mastectomy scars, No Peau d' Orange. Breast Lump-No Palpable Breast Mass.  Cardiovascular Cardiovascular examination reveals -normal heart sounds, regular rate and rhythm with no murmurs and normal pedal pulses bilaterally.  Abdomen Inspection Inspection of the abdomen reveals - No Hernias. Skin - Scar - no surgical scars. Palpation/Percussion Palpation and Percussion of the abdomen reveal - Soft, Non Tender, No Rebound tenderness, No Rigidity (guarding) and No hepatosplenomegaly. Auscultation Auscultation of the abdomen reveals - Bowel sounds normal. Note: Scar noted upper abdomen. Right lower quadrant abdomen shows an area from an insect bite she states that is slightly erythematous. There is no abscess. This happened one week ago.  Neurologic Neurologic evaluation reveals -alert and oriented x 3 with no impairment of recent or remote memory. Mental Status-Normal.  Musculoskeletal Normal Exam - Left-Upper Extremity Strength Normal and Lower  Extremity Strength Normal. Normal Exam - Right-Upper Extremity Strength Normal and Lower Extremity Strength Normal.  Lymphatic Head & Neck  General Head & Neck Lymphatics: Bilateral - Description - Normal. Axillary  General Axillary Region: Bilateral - Description - Normal. Tenderness - Non Tender. Femoral & Inguinal  Generalized Femoral & Inguinal Lymphatics: Bilateral - Description - Normal. Tenderness - Non Tender.    Assessment & Plan (Jaythan Hinely A. Cylee Dattilo MD; 08/07/2016 2:57 PM)  BREAST CANCER, LEFT (C50.912) Impression: Discussed breast conservation versus mastectomy with reconstruction. Discussed the need for central lymph node mapping. Risks, benefits and long-term expectations of each discussed. Recurrence rates and survival statistics discussed. Risk of lumpectomy include bleeding, infection, seroma, more surgery, use of seed/wire, wound care, cosmetic deformity and the need for other treatments, death , blood clots, death. Pt agrees to proceed. Risk of sentinel lymph node mapping include bleeding, infection, lymphedema, shoulder pain. stiffness, dye allergy. cosmetic deformity , blood clots, death, need for more surgery. Pt agres to proceed.  Current Plans You are being scheduled for surgery - Our schedulers will call you.  You should hear from our office's scheduling department within 5 working days about the location, date, and time of surgery. We try to make accommodations for patient's preferences in scheduling surgery, but sometimes the OR schedule or the surgeon's schedule prevents [place] from making those accommodations.  If you have not heard from our office [phone]) in 5 working days, call the office and ask for your surgeon's nurse.  If you have other questions about your diagnosis, plan, or surgery, call the office and ask for your surgeon's nurse.  Pt Education - [facility] Breast Cancer Information Given - [facility] "Breast Journey" Package Pt Education - Pamphlet Given  - Breast Biopsy: discussed with patient and provided information. We discussed the staging and pathophysiology of breast cancer. We discussed all of the different options for treatment for breast cancer including surgery, chemotherapy, radiation therapy, Herceptin, and antiestrogen therapy. We discussed a sentinel lymph node biopsy as she does not appear to having lymph node involvement right now. We discussed the performance of that with injection of radioactive tracer and blue dye. We discussed that she would have an incision underneath her axillary hairline. We discussed that there is a bout a 10-20% chance of having a positive node with a sentinel lymph node biopsy and we will await the permanent pathology to make any other first further decisions in terms of her treatment. One of these  options might be to return to the operating room to perform an axillary lymph node dissection. We discussed about a 1-2% risk lifetime of chronic shoulder pain as well as lymphedema associated with a sentinel lymph node biopsy. We discussed the options for treatment of the breast cancer which included lumpectomy versus a mastectomy. We discussed the performance of the lumpectomy with a wire placement. We discussed a 10-20% chance of a positive margin requiring reexcision in the operating room. We also discussed that she may need radiation therapy or antiestrogen therapy or both if she undergoes lumpectomy. We discussed the mastectomy and the postoperative care for that as well. We discussed that there is no difference in her survival whether she undergoes lumpectomy with radiation therapy or antiestrogen therapy versus a mastectomy. There is a slight difference in the local recurrence rate being 3-5% with lumpectomy and about 1% with a mastectomy. We discussed the risks of operation including bleeding, infection, possible reoperation. She understands her further therapy will be based on what her stages at the time of her  operation.  Pt Education - [facility] breast cancer surgery: discussed with patient and provided information. Pt Education - [facility] Breast Biopsy HCI: discussed with patient and provided information. Pt Education - ABC (After Breast Cancer) Class Info: discussed with patient and provided information. CELLULITIS (L03.90) Impression: doxycycline 100 mg po bid x 10 days  Current Plans Started Bactrim DS 800-160MG, 1 (one) Tablet two times daily, #20, 08/07/2016, No Refill.

## 2016-08-15 ENCOUNTER — Other Ambulatory Visit: Payer: Self-pay | Admitting: Surgery

## 2016-08-15 ENCOUNTER — Encounter (HOSPITAL_BASED_OUTPATIENT_CLINIC_OR_DEPARTMENT_OTHER): Payer: Self-pay | Admitting: *Deleted

## 2016-08-15 DIAGNOSIS — C50912 Malignant neoplasm of unspecified site of left female breast: Secondary | ICD-10-CM

## 2016-08-22 ENCOUNTER — Ambulatory Visit
Admission: RE | Admit: 2016-08-22 | Discharge: 2016-08-22 | Disposition: A | Discharge status: Home / Self Care | Payer: Medicare Other | Source: Ambulatory Visit | Attending: Surgery | Admitting: Surgery

## 2016-08-22 DIAGNOSIS — C50912 Malignant neoplasm of unspecified site of left female breast: Secondary | ICD-10-CM

## 2016-08-22 DIAGNOSIS — R928 Other abnormal and inconclusive findings on diagnostic imaging of breast: Secondary | ICD-10-CM | POA: Diagnosis not present

## 2016-08-22 NOTE — Pre-Procedure Instructions (Signed)
Patient given boost drink with instruction to drink by 0830 on DOS. She voiced understanding.

## 2016-08-24 ENCOUNTER — Ambulatory Visit (HOSPITAL_BASED_OUTPATIENT_CLINIC_OR_DEPARTMENT_OTHER): Payer: Medicare Other | Admitting: Anesthesiology

## 2016-08-24 ENCOUNTER — Encounter (HOSPITAL_BASED_OUTPATIENT_CLINIC_OR_DEPARTMENT_OTHER): Payer: Self-pay | Admitting: Anesthesiology

## 2016-08-24 ENCOUNTER — Encounter (HOSPITAL_BASED_OUTPATIENT_CLINIC_OR_DEPARTMENT_OTHER)
Admission: RE | Disposition: A | Discharge status: Home / Self Care | Payer: Self-pay | Source: Ambulatory Visit | Attending: Surgery

## 2016-08-24 ENCOUNTER — Ambulatory Visit
Admission: RE | Admit: 2016-08-24 | Discharge: 2016-08-24 | Disposition: A | Discharge status: Home / Self Care | Payer: Medicare Other | Source: Ambulatory Visit | Attending: Surgery | Admitting: Surgery

## 2016-08-24 ENCOUNTER — Encounter (HOSPITAL_COMMUNITY)
Admission: RE | Admit: 2016-08-24 | Discharge: 2016-08-24 | Disposition: A | Discharge status: Home / Self Care | Payer: Medicare Other | Source: Ambulatory Visit | Attending: Surgery | Admitting: Surgery

## 2016-08-24 ENCOUNTER — Ambulatory Visit (HOSPITAL_BASED_OUTPATIENT_CLINIC_OR_DEPARTMENT_OTHER)
Admission: RE | Admit: 2016-08-24 | Discharge: 2016-08-24 | Disposition: A | Discharge status: Home / Self Care | Payer: Medicare Other | Source: Ambulatory Visit | Attending: Surgery | Admitting: Surgery

## 2016-08-24 DIAGNOSIS — R928 Other abnormal and inconclusive findings on diagnostic imaging of breast: Secondary | ICD-10-CM | POA: Diagnosis not present

## 2016-08-24 DIAGNOSIS — K649 Unspecified hemorrhoids: Secondary | ICD-10-CM | POA: Diagnosis not present

## 2016-08-24 DIAGNOSIS — Z9049 Acquired absence of other specified parts of digestive tract: Secondary | ICD-10-CM | POA: Diagnosis not present

## 2016-08-24 DIAGNOSIS — Z881 Allergy status to other antibiotic agents status: Secondary | ICD-10-CM | POA: Insufficient documentation

## 2016-08-24 DIAGNOSIS — J302 Other seasonal allergic rhinitis: Secondary | ICD-10-CM | POA: Insufficient documentation

## 2016-08-24 DIAGNOSIS — Z7982 Long term (current) use of aspirin: Secondary | ICD-10-CM | POA: Diagnosis not present

## 2016-08-24 DIAGNOSIS — Z8249 Family history of ischemic heart disease and other diseases of the circulatory system: Secondary | ICD-10-CM | POA: Insufficient documentation

## 2016-08-24 DIAGNOSIS — F329 Major depressive disorder, single episode, unspecified: Secondary | ICD-10-CM | POA: Diagnosis not present

## 2016-08-24 DIAGNOSIS — Z90710 Acquired absence of both cervix and uterus: Secondary | ICD-10-CM | POA: Diagnosis not present

## 2016-08-24 DIAGNOSIS — Z8261 Family history of arthritis: Secondary | ICD-10-CM | POA: Insufficient documentation

## 2016-08-24 DIAGNOSIS — C50912 Malignant neoplasm of unspecified site of left female breast: Secondary | ICD-10-CM | POA: Diagnosis not present

## 2016-08-24 DIAGNOSIS — K219 Gastro-esophageal reflux disease without esophagitis: Secondary | ICD-10-CM | POA: Insufficient documentation

## 2016-08-24 DIAGNOSIS — Z885 Allergy status to narcotic agent status: Secondary | ICD-10-CM | POA: Diagnosis not present

## 2016-08-24 DIAGNOSIS — D0512 Intraductal carcinoma in situ of left breast: Secondary | ICD-10-CM | POA: Diagnosis not present

## 2016-08-24 DIAGNOSIS — Z79899 Other long term (current) drug therapy: Secondary | ICD-10-CM | POA: Insufficient documentation

## 2016-08-24 DIAGNOSIS — K5900 Constipation, unspecified: Secondary | ICD-10-CM | POA: Insufficient documentation

## 2016-08-24 DIAGNOSIS — Z811 Family history of alcohol abuse and dependence: Secondary | ICD-10-CM | POA: Insufficient documentation

## 2016-08-24 DIAGNOSIS — Z170 Estrogen receptor positive status [ER+]: Secondary | ICD-10-CM | POA: Diagnosis not present

## 2016-08-24 DIAGNOSIS — M1990 Unspecified osteoarthritis, unspecified site: Secondary | ICD-10-CM | POA: Insufficient documentation

## 2016-08-24 DIAGNOSIS — C50412 Malignant neoplasm of upper-outer quadrant of left female breast: Secondary | ICD-10-CM | POA: Diagnosis not present

## 2016-08-24 DIAGNOSIS — Z79891 Long term (current) use of opiate analgesic: Secondary | ICD-10-CM | POA: Diagnosis not present

## 2016-08-24 DIAGNOSIS — F419 Anxiety disorder, unspecified: Secondary | ICD-10-CM | POA: Diagnosis not present

## 2016-08-24 DIAGNOSIS — G4730 Sleep apnea, unspecified: Secondary | ICD-10-CM | POA: Diagnosis not present

## 2016-08-24 DIAGNOSIS — E039 Hypothyroidism, unspecified: Secondary | ICD-10-CM | POA: Insufficient documentation

## 2016-08-24 DIAGNOSIS — Z8049 Family history of malignant neoplasm of other genital organs: Secondary | ICD-10-CM | POA: Insufficient documentation

## 2016-08-24 DIAGNOSIS — E7800 Pure hypercholesterolemia, unspecified: Secondary | ICD-10-CM | POA: Diagnosis not present

## 2016-08-24 DIAGNOSIS — Z8041 Family history of malignant neoplasm of ovary: Secondary | ICD-10-CM | POA: Insufficient documentation

## 2016-08-24 DIAGNOSIS — Z87442 Personal history of urinary calculi: Secondary | ICD-10-CM | POA: Diagnosis not present

## 2016-08-24 DIAGNOSIS — G8918 Other acute postprocedural pain: Secondary | ICD-10-CM | POA: Diagnosis not present

## 2016-08-24 DIAGNOSIS — Z6841 Body Mass Index (BMI) 40.0 and over, adult: Secondary | ICD-10-CM | POA: Insufficient documentation

## 2016-08-24 DIAGNOSIS — Z9889 Other specified postprocedural states: Secondary | ICD-10-CM | POA: Diagnosis not present

## 2016-08-24 DIAGNOSIS — G43909 Migraine, unspecified, not intractable, without status migrainosus: Secondary | ICD-10-CM | POA: Diagnosis not present

## 2016-08-24 DIAGNOSIS — Z818 Family history of other mental and behavioral disorders: Secondary | ICD-10-CM | POA: Insufficient documentation

## 2016-08-24 DIAGNOSIS — I10 Essential (primary) hypertension: Secondary | ICD-10-CM | POA: Diagnosis not present

## 2016-08-24 DIAGNOSIS — Z833 Family history of diabetes mellitus: Secondary | ICD-10-CM | POA: Insufficient documentation

## 2016-08-24 DIAGNOSIS — Z880 Allergy status to penicillin: Secondary | ICD-10-CM | POA: Insufficient documentation

## 2016-08-24 HISTORY — PX: BREAST LUMPECTOMY WITH RADIOACTIVE SEED AND SENTINEL LYMPH NODE BIOPSY: SHX6550

## 2016-08-24 HISTORY — DX: Sleep apnea, unspecified: G47.30

## 2016-08-24 HISTORY — DX: Gastro-esophageal reflux disease without esophagitis: K21.9

## 2016-08-24 HISTORY — DX: Spinal stenosis, site unspecified: M48.00

## 2016-08-24 HISTORY — PX: BREAST LUMPECTOMY: SHX2

## 2016-08-24 SURGERY — BREAST LUMPECTOMY WITH RADIOACTIVE SEED AND SENTINEL LYMPH NODE BIOPSY
Anesthesia: Regional | Site: Breast | Laterality: Left

## 2016-08-24 MED ORDER — CLINDAMYCIN PHOSPHATE 600 MG/50ML IV SOLN
INTRAVENOUS | Status: AC
  Filled 2016-08-24: qty 50

## 2016-08-24 MED ORDER — LIDOCAINE 2% (20 MG/ML) 5 ML SYRINGE
INTRAMUSCULAR | Status: AC
  Filled 2016-08-24: qty 5

## 2016-08-24 MED ORDER — FENTANYL CITRATE (PF) 100 MCG/2ML IJ SOLN
INTRAMUSCULAR | Status: AC
  Filled 2016-08-24: qty 2

## 2016-08-24 MED ORDER — DEXAMETHASONE SODIUM PHOSPHATE 4 MG/ML IJ SOLN
INTRAMUSCULAR | Status: DC | PRN
  Administered 2016-08-24: 10 mg via INTRAVENOUS

## 2016-08-24 MED ORDER — PROPOFOL 10 MG/ML IV BOLUS
INTRAVENOUS | Status: DC | PRN
  Administered 2016-08-24: 200 mg via INTRAVENOUS

## 2016-08-24 MED ORDER — OXYCODONE-ACETAMINOPHEN 5-325 MG PO TABS
ORAL_TABLET | ORAL | Status: AC
  Filled 2016-08-24: qty 1

## 2016-08-24 MED ORDER — ONDANSETRON HCL 4 MG/2ML IJ SOLN
INTRAMUSCULAR | Status: DC | PRN
  Administered 2016-08-24: 4 mg via INTRAVENOUS

## 2016-08-24 MED ORDER — CHLORHEXIDINE GLUCONATE CLOTH 2 % EX PADS: 6.0000 | MEDICATED_PAD | Freq: Once | CUTANEOUS | Status: DC

## 2016-08-24 MED ORDER — 0.9 % SODIUM CHLORIDE (POUR BTL) OPTIME
TOPICAL | Status: DC | PRN
  Administered 2016-08-24: 500 mL

## 2016-08-24 MED ORDER — MIDAZOLAM HCL 2 MG/2ML IJ SOLN
1.0000 mg | INTRAMUSCULAR | Status: DC | PRN
  Administered 2016-08-24: 1 mg via INTRAVENOUS

## 2016-08-24 MED ORDER — LACTATED RINGERS IV SOLN
INTRAVENOUS | Status: DC
  Administered 2016-08-24 (×2): via INTRAVENOUS

## 2016-08-24 MED ORDER — LIDOCAINE HCL (CARDIAC) 20 MG/ML IV SOLN
INTRAVENOUS | Status: DC | PRN
  Administered 2016-08-24: 30 mg via INTRAVENOUS

## 2016-08-24 MED ORDER — OXYCODONE-ACETAMINOPHEN 5-325 MG PO TABS
1.0000 | ORAL_TABLET | ORAL | 0 refills | Status: DC | PRN
Start: 2016-08-24 — End: 2016-09-29

## 2016-08-24 MED ORDER — FENTANYL CITRATE (PF) 100 MCG/2ML IJ SOLN
50.0000 ug | INTRAMUSCULAR | Status: DC | PRN
  Administered 2016-08-24: 50 ug via INTRAVENOUS

## 2016-08-24 MED ORDER — DEXAMETHASONE SODIUM PHOSPHATE 10 MG/ML IJ SOLN
INTRAMUSCULAR | Status: AC
Start: 2016-08-24 — End: 2016-08-24
  Filled 2016-08-24: qty 1

## 2016-08-24 MED ORDER — PROMETHAZINE HCL 25 MG/ML IJ SOLN: 6.2500 mg | INTRAMUSCULAR | Status: DC | PRN

## 2016-08-24 MED ORDER — MIDAZOLAM HCL 2 MG/2ML IJ SOLN
INTRAMUSCULAR | Status: AC
  Filled 2016-08-24: qty 2

## 2016-08-24 MED ORDER — FENTANYL CITRATE (PF) 100 MCG/2ML IJ SOLN
25.0000 ug | INTRAMUSCULAR | Status: DC | PRN
  Administered 2016-08-24: 50 ug via INTRAVENOUS
  Administered 2016-08-24: 25 ug via INTRAVENOUS
  Administered 2016-08-24: 50 ug via INTRAVENOUS
  Administered 2016-08-24: 25 ug via INTRAVENOUS

## 2016-08-24 MED ORDER — BUPIVACAINE HCL (PF) 0.5 % IJ SOLN
INTRAMUSCULAR | Status: DC | PRN
  Administered 2016-08-24: 18 mL

## 2016-08-24 MED ORDER — METHYLENE BLUE 0.5 % INJ SOLN
INTRAVENOUS | Status: DC | PRN
  Administered 2016-08-24: 5 mL

## 2016-08-24 MED ORDER — CLINDAMYCIN PHOSPHATE 600 MG/50ML IV SOLN
600.0000 mg | Freq: Once | INTRAVENOUS | Status: AC
  Administered 2016-08-24: 600 mg via INTRAVENOUS

## 2016-08-24 MED ORDER — OXYCODONE-ACETAMINOPHEN 5-325 MG PO TABS
1.0000 | ORAL_TABLET | Freq: Once | ORAL | Status: AC
  Administered 2016-08-24: 1 via ORAL

## 2016-08-24 MED ORDER — SCOPOLAMINE 1 MG/3DAYS TD PT72: 1.0000 | MEDICATED_PATCH | Freq: Once | TRANSDERMAL | Status: DC | PRN

## 2016-08-24 MED ORDER — PROPOFOL 10 MG/ML IV BOLUS
INTRAVENOUS | Status: AC
  Filled 2016-08-24: qty 20

## 2016-08-24 MED ORDER — ONDANSETRON HCL 4 MG/2ML IJ SOLN
INTRAMUSCULAR | Status: AC
  Filled 2016-08-24: qty 2

## 2016-08-24 MED ORDER — FENTANYL CITRATE (PF) 100 MCG/2ML IJ SOLN
INTRAMUSCULAR | Status: DC | PRN
  Administered 2016-08-24: 100 ug via INTRAVENOUS
  Administered 2016-08-24: 25 ug via INTRAVENOUS

## 2016-08-24 MED ORDER — MIDAZOLAM HCL 5 MG/5ML IJ SOLN
INTRAMUSCULAR | Status: DC | PRN
  Administered 2016-08-24: 2 mg via INTRAVENOUS

## 2016-08-24 MED ORDER — TECHNETIUM TC 99M SULFUR COLLOID FILTERED
1.0000 | Freq: Once | INTRAVENOUS | Status: AC | PRN
  Administered 2016-08-24: 1 via INTRADERMAL

## 2016-08-24 MED ORDER — GLYCOPYRROLATE 0.2 MG/ML IJ SOLN
0.2000 mg | Freq: Once | INTRAMUSCULAR | Status: AC | PRN
  Administered 2016-08-24: 0.2 mg via INTRAVENOUS

## 2016-08-24 SURGICAL SUPPLY — 45 items
ADH SKN CLS APL DERMABOND .7 (GAUZE/BANDAGES/DRESSINGS) ×1
APPLIER CLIP 9.375 MED OPEN (MISCELLANEOUS) ×3
APR CLP MED 9.3 20 MLT OPN (MISCELLANEOUS) ×1
BINDER BREAST XXLRG (GAUZE/BANDAGES/DRESSINGS) ×2 IMPLANT
BLADE SURG 15 STRL LF DISP TIS (BLADE) ×1 IMPLANT
BLADE SURG 15 STRL SS (BLADE) ×3
CANISTER SUCT 1200ML W/VALVE (MISCELLANEOUS) ×3 IMPLANT
CHLORAPREP W/TINT 26ML (MISCELLANEOUS) ×3 IMPLANT
CLIP APPLIE 9.375 MED OPEN (MISCELLANEOUS) ×1 IMPLANT
COVER BACK TABLE 60X90IN (DRAPES) ×3 IMPLANT
COVER MAYO STAND STRL (DRAPES) ×3 IMPLANT
COVER PROBE W GEL 5X96 (DRAPES) ×3 IMPLANT
DERMABOND ADVANCED (GAUZE/BANDAGES/DRESSINGS) ×2
DERMABOND ADVANCED .7 DNX12 (GAUZE/BANDAGES/DRESSINGS) ×1 IMPLANT
DEVICE DUBIN W/COMP PLATE 8390 (MISCELLANEOUS) ×3 IMPLANT
DRAPE LAPAROSCOPIC ABDOMINAL (DRAPES) ×3 IMPLANT
DRAPE UTILITY XL STRL (DRAPES) ×3 IMPLANT
ELECT COATED BLADE 2.86 ST (ELECTRODE) ×3 IMPLANT
ELECT REM PT RETURN 9FT ADLT (ELECTROSURGICAL) ×3
ELECTRODE REM PT RTRN 9FT ADLT (ELECTROSURGICAL) ×1 IMPLANT
GLOVE BIOGEL PI IND STRL 8 (GLOVE) ×1 IMPLANT
GLOVE BIOGEL PI INDICATOR 8 (GLOVE) ×2
GLOVE ECLIPSE 8.0 STRL XLNG CF (GLOVE) ×3 IMPLANT
GLOVE SURG SS PI 7.0 STRL IVOR (GLOVE) ×2 IMPLANT
GOWN STRL REUS W/ TWL LRG LVL3 (GOWN DISPOSABLE) ×2 IMPLANT
GOWN STRL REUS W/TWL LRG LVL3 (GOWN DISPOSABLE) ×9
HEMOSTAT SNOW SURGICEL 2X4 (HEMOSTASIS) ×3 IMPLANT
KIT MARKER MARGIN INK (KITS) ×3 IMPLANT
NDL HYPO 25X1 1.5 SAFETY (NEEDLE) ×1 IMPLANT
NDL SAFETY ECLIPSE 18X1.5 (NEEDLE) IMPLANT
NEEDLE HYPO 18GX1.5 SHARP (NEEDLE) ×3
NEEDLE HYPO 25X1 1.5 SAFETY (NEEDLE) ×3 IMPLANT
NS IRRIG 1000ML POUR BTL (IV SOLUTION) ×3 IMPLANT
PACK BASIN DAY SURGERY FS (CUSTOM PROCEDURE TRAY) ×3 IMPLANT
PENCIL BUTTON HOLSTER BLD 10FT (ELECTRODE) ×3 IMPLANT
SLEEVE SCD COMPRESS KNEE MED (MISCELLANEOUS) ×3 IMPLANT
SPONGE LAP 4X18 X RAY DECT (DISPOSABLE) ×3 IMPLANT
SUT MNCRL AB 4-0 PS2 18 (SUTURE) ×3 IMPLANT
SUT VICRYL 3-0 CR8 SH (SUTURE) ×3 IMPLANT
SYR CONTROL 10ML LL (SYRINGE) ×3 IMPLANT
TOWEL OR 17X24 6PK STRL BLUE (TOWEL DISPOSABLE) ×3 IMPLANT
TOWEL OR NON WOVEN STRL DISP B (DISPOSABLE) ×3 IMPLANT
TUBE CONNECTING 20'X1/4 (TUBING) ×1
TUBE CONNECTING 20X1/4 (TUBING) ×2 IMPLANT
YANKAUER SUCT BULB TIP NO VENT (SUCTIONS) ×3 IMPLANT

## 2016-08-24 NOTE — Anesthesia Postprocedure Evaluation (Signed)
Anesthesia Post Note  Patient: SABREENA STURDEVANT  Procedure(s) Performed: Procedure(s) (LRB): LEFT BREAST PARTIAL MASTECTOMY WITH RADIOACTIVE SEED AND LEFT SENTINEL LYMPH NODE MAPPING (Left)  Patient location during evaluation: PACU Anesthesia Type: General and Regional Level of consciousness: awake and alert Pain management: pain level controlled Vital Signs Assessment: post-procedure vital signs reviewed and stable Respiratory status: spontaneous breathing, nonlabored ventilation, respiratory function stable and patient connected to nasal cannula oxygen Cardiovascular status: blood pressure returned to baseline and stable Postop Assessment: no signs of nausea or vomiting Anesthetic complications: no    Last Vitals:  Vitals:   08/24/16 1415 08/24/16 1430  BP: 116/66 113/63  Pulse: 73 68  Resp: 12 15  Temp:      Last Pain:  Vitals:   08/24/16 1430  TempSrc:   PainSc: 4                  Catalina Gravel

## 2016-08-24 NOTE — Interval H&P Note (Signed)
History and Physical Interval Note:  08/24/2016 11:54 AM  Tracy Valentine  has presented today for surgery, with the diagnosis of LEFT BREAST CANCER  The various methods of treatment have been discussed with the patient and family. After consideration of risks, benefits and other options for treatment, the patient has consented to  Procedure(s): LEFT BREAST PARTIAL MASTECTOMY WITH RADIOACTIVE SEED AND LEFT SENTINEL LYMPH NODE MAPPING (Left) as a surgical intervention .  The patient's history has been reviewed, patient examined, no change in status, stable for surgery.  I have reviewed the patient's chart and labs.  Questions were answered to the patient's satisfaction.     Adriyana Greenbaum A.

## 2016-08-24 NOTE — Progress Notes (Signed)
Assisted Dr. Turk with left, ultrasound guided, pectoralis block. Side rails up, monitors on throughout procedure. See vital signs in flow sheet. Tolerated Procedure well. 

## 2016-08-24 NOTE — Op Note (Signed)
Preoperative diagnosis: Stage I left breast cancer upper outer quadrant  Postoperative diagnosis: Same  Procedure: Left breast seed localized partial mastectomy with left axillary sentinel lymph node mapping with methylene blue dye  Surgeon: Erroll Luna M.D.  Anesthesia: LMA with pectoral block and 0.25% Sensorcaine local with epinephrine  EBL: Minimal  Specimen: Left breast mass was eating clip verified by radiograph and 2 left axillary sentinel nodes blue and hot  Drains: None  Indications for procedure: The patient is a 60 year old female with stage I left breast cancer. She presents for lumpectomy and sentinel lymph node mapping on the left. She was counseled about surgical options as well as mastectomy and reconstruction. She was counseled about the potential need for chemotherapy and radiation therapy. All her options were discussed in detail in the office. She opted for lumpectomy. Risk of bleeding, infection, cosmetic deformity, seroma, blood vessel injury, nerve injury, numbness, shoulder weakness, arm swelling, and need for reoperation and/or the procedures were discussed.The procedure has been discussed with the patient.  Alternative therapies have been discussed with the patient.  Operative risks include bleeding,  Infection,  Organ injury,  Nerve injury,  Blood vessel injury,  DVT,  Pulmonary embolism,  Death,  And possible reoperation.  Medical management risks include worsening of present situation.  The success of the procedure is 50 -100 % at treating patients symptoms.  The patient understands and agrees to proceed.    Description of procedure: The patient was met in the holding area. She had with pectoral block by anesthesia on the left. Neoprobe was used and seed was localized. Left breast was marked as correct. Questions are answered. She was taken back to the operating room and placed supine. After induction of general anesthesia, left breast was prepped and draped in a  sterile fashion. Timeout was done. She received preoperative antibiotics. Neoprobe was used and seed was localized in the left breast upper outer quadrant. Curvilinear incision was made just. Aspect of the nipple areolar complex. Local anesthetic was infiltrated. Dissection was carried using the neoprobe as a guide to DC clear. The entire tumor was excised in its entirety with grossly negative margins. Neoprobe was used to verify seed specimen. Radiographs show both seeding clip in specimen. Cavity made hemostatic irrigated. Was closed with 3-0 Vicryl 4-0 Monocryl.  4 mL of methylene blue dye were injected in a subareolar position. Massage was done. Neoprobe was used and hotspot identified in the left axilla. 4 cm incision was made left axilla dissection was carried down into the level I axillary contents. There is 3 blue hot sentinel nodes identified excised. Background counts approached 0. The long thoracic nerve, thoracodorsal trunk and subclavian vein were preserved. Hemostasis achieved. Surgicel placed after irrigation. Wound closure 3-0 Vicryl and 4-0 Monocryl. Liquid adhesive applied. All final counts found to be correct. Breast binder placed. Patient was in awoke extubated taken to recovery in satisfactory condition.

## 2016-08-24 NOTE — H&P (View-Only) (Signed)
Tracy Valentine 08/07/2016 2:13 PM Location: [facility] Patient #: [id number] DOB: October 13, 1956 Married / Language: English / Race: White Female  History of Present Illness Tracy Moores A. Raye Wiens MD; 08/07/2016 2:53 PM) Patient words: Patient sent at the request of Dr. Evette Valentine for a left breast mammographic abnormality. This was picked up on screening mammogram. There is an area of architectural distortion left breast upper outer quadrant and a second area in the lower breast. Both areas were biopsied. The area in the left upper outer quadrant was consistent with invasive ductal carcinoma grade 3 ER positive PR negative HER-2/neu negative with a Ki-67 of 70%. Patient denies any history of breast pain, breast mass or nipple discharge. No family history of breast cancer. There is a family history of ovarian and uterine cancer.                       Study Result  CLINICAL DATA: Recall from screening mammography. EXAM: 2D DIGITAL DIAGNOSTIC LEFT MAMMOGRAM WITH CAD AND ADJUNCT TOMO ULTRASOUND LEFT BREAST COMPARISON: Previous exam(s). ACR Breast Density Category b: There are scattered areas of fibroglandular density. FINDINGS: Additional views of the left breast with tomosynthesis demonstrate a persistent area of asymmetry with distortion to be present within the anterior portion of the upper-outer quadrant of the left breast at approximately the 1 to 2 o'clock position. Mammographic images were processed with CAD. On physical exam, there is no discrete palpable abnormality in the area of mammographic concern. Targeted ultrasound is performed, showing no definite ultrasound correlate for the mammographic finding. IMPRESSION: Area of developing asymmetry with distortion located within the upper-outer quadrant left breast. Tissue sampling via tomosynthesis guided biopsy is recommended and will be scheduled. RECOMMENDATION: Left breast tomosynthesis  guided biopsy. This has been scheduled for 07/28/2016. I have discussed the findings and recommendations with the patient. Results were also provided in writing at the conclusion of the visit. If applicable, a reminder letter will be sent to the patient regarding the next appointment. BI-RADS CATEGORY 4: Suspicious. Electronically Signed By: Tracy Valentine M.D. On: 07/26/2016 12:45            ADDITIONAL INFORMATION: 2. FLUORESCENCE IN-SITU HYBRIDIZATION Results: HER2 - NEGATIVE RATIO OF HER2/CEP17 SIGNALS 1.03 AVERAGE HER2 COPY NUMBER PER CELL 2.00 Reference Range: NEGATIVE HER2/CEP17 Ratio <2.0 and average HER2 copy number <4.0 EQUIVOCAL HER2/CEP17 Ratio <2.0 and average HER2 copy number >=4.0 and <6.0 POSITIVE HER2/CEP17 Ratio >=2.0 or <2.0 and average HER2 copy number >=6.0 Tracy Laws MD Pathologist, Electronic Signature ( Signed 08/03/2016) 2. PROGNOSTIC INDICATORS Results: IMMUNOHISTOCHEMICAL AND MORPHOMETRIC ANALYSIS PERFORMED MANUALLY Estrogen Receptor: 80%, POSITIVE, MODERATE STAINING INTENSITY Progesterone Receptor: 0%, NEGATIVE Proliferation Marker Ki67: 70% COMMENT: The negative hormone receptor study(ies) in this case has an internal positive control. REFERENCE RANGE ESTROGEN RECEPTOR NEGATIVE 0% POSITIVE =>1% REFERENCE RANGE PROGESTERONE RECEPTOR 1 of 3 FINAL for Tracy Valentine ([id number]) ADDITIONAL INFORMATION:(continued) NEGATIVE 0% POSITIVE =>1% All controls stained appropriately Tracy Cutter MD Pathologist, Electronic Signature ( Signed 08/01/2016) FINAL DIAGNOSIS Diagnosis 1. Breast, left, needle core biopsy, outer - BENIGN BREAST TISSUE, SEE COMMENT. - NO MALIGNANCY IDENTIFIED. 2. Breast, left, needle core biopsy, upper outer quadrant - INVASIVE DUCTAL CARCINOMA, SEE COMMENT. - HIGH GRADE DUCTAL CARCINOMA IN SITU WITH NECROSIS.  The patient is a 60 year old female.   Other Problems Tracy Valentine, CMA; 08/07/2016 2:13  PM) Anxiety Disorder Arthritis Back Pain Cholelithiasis Depression Gastroesophageal Reflux Disease Hemorrhoids Hypercholesterolemia Kidney Stone Migraine Headache Sleep Apnea  Thyroid Disease  Past Surgical History Tracy Valentine, CMA; 08/07/2016 2:13 PM) Breast Biopsy Left. Gallbladder Surgery - Open Hysterectomy (not due to cancer) - Partial  Diagnostic Studies History Tracy Valentine, CMA; 08/07/2016 2:13 PM) Colonoscopy >10 years ago Mammogram within last year Pap Smear 1-5 years ago  Allergies Tracy Valentine, CMA; 08/07/2016 2:14 PM) Darvocet A500 *ANALGESICS - OPIOID* Azithromycin *CHEMICALS* Amoxicillin *PENICILLINS* Doxycycline *DERMATOLOGICALS*  Medication History Tracy Valentine, CMA; 08/07/2016 2:16 PM) Levothyroxine Sodium (100MCG Tablet, Oral) Active. Montelukast Sodium (10MG Tablet, Oral) Active. Simvastatin (20MG Tablet, Oral) Active. ALPRAZolam (1MG Tablet, Oral) Active. Effexor (75MG Tablet, Oral two times daily) Active. Ambien (10MG Tablet, Oral) Active. OxyCODONE HCl (10MG Tablet, Oral) Active. Aspirin (81MG Tablet, Oral) Active. Vitamin D3 (1000UNIT Tablet, Oral) Active. Stool Softener (100MG Capsule, Oral) Active. Medications Reconciled  Social History Tracy Valentine; 08/07/2016 2:13 PM) Caffeine use Tea. No alcohol use No drug use Tobacco use Never smoker.  Family History Tracy Valentine; 08/07/2016 2:13 PM) Alcohol Abuse Mother. Arthritis Father. Cervical Cancer Mother. Depression Mother. Diabetes Mellitus Mother. Heart Disease Father. Ovarian Cancer Mother. Respiratory Condition Mother.  Pregnancy / Birth History Tracy Valentine, CMA; 08/07/2016 2:13 PM) Age at menarche 16 years. Age of menopause 33-50 Gravida 1 Maternal age 51-20 Para 1     Review of Systems Tracy Valentine CMA; 08/07/2016 2:13 PM) General Not Present- Appetite Loss, Chills, Fatigue, Fever, Night Sweats, Weight Gain  and Weight Loss. Skin Not Present- Change in Wart/Mole, Dryness, Hives, Jaundice, New Lesions, Non-Healing Wounds, Rash and Ulcer. HEENT Present- Seasonal Allergies. Not Present- Earache, Hearing Loss, Hoarseness, Nose Bleed, Oral Ulcers, Ringing in the Ears, Sinus Pain, Sore Throat, Visual Disturbances, Wears glasses/contact lenses and Yellow Eyes. Respiratory Present- Snoring. Not Present- Bloody sputum, Chronic Cough, Difficulty Breathing and Wheezing. Breast Not Present- Breast Mass, Breast Pain, Nipple Discharge and Skin Changes. Cardiovascular Not Present- Chest Pain, Difficulty Breathing Lying Down, Leg Cramps, Palpitations, Rapid Heart Rate, Shortness of Breath and Swelling of Extremities. Gastrointestinal Present- Constipation. Not Present- Abdominal Pain, Bloating, Bloody Stool, Change in Bowel Habits, Chronic diarrhea, Difficulty Swallowing, Excessive gas, Gets full quickly at meals, Hemorrhoids, Indigestion, Nausea, Rectal Pain and Vomiting. Female Genitourinary Not Present- Frequency, Nocturia, Painful Urination, Pelvic Pain and Urgency. Musculoskeletal Present- Back Pain and Joint Stiffness. Not Present- Joint Pain, Muscle Pain, Muscle Weakness and Swelling of Extremities. Neurological Present- Tingling. Not Present- Decreased Memory, Fainting, Headaches, Numbness, Seizures, Tremor, Trouble walking and Weakness. Psychiatric Present- Anxiety and Depression. Not Present- Bipolar, Change in Sleep Pattern, Fearful and Frequent crying. Hematology Present- Easy Bruising. Not Present- Blood Thinners, Excessive bleeding, Gland problems, HIV and Persistent Infections.  Vitals Tracy Valentine CMA; 08/07/2016 2:16 PM) 08/07/2016 2:16 PM Weight: 254.8 lb Height: 65in Body Surface Area: 2.19 m Body Mass Index: 42.4 kg/m  Temp.: 97.50F(Temporal)  Pulse: 76 (Regular)  BP: 130/76 (Sitting, Left Arm, Standard)      Physical Exam (Arthur Speagle A. Bronsyn Shappell MD; 08/07/2016 2:53  PM)  General Mental Status-Alert. General Appearance-Consistent with stated age. Hydration-Well hydrated. Voice-Normal.  Head and Neck Head-normocephalic, atraumatic with no lesions or palpable masses. Trachea-midline. Thyroid Gland Characteristics - normal size and consistency.  Eye Eyeball - Bilateral-Extraocular movements intact. Sclera/Conjunctiva - Bilateral-No scleral icterus.  Chest and Lung Exam Chest and lung exam reveals -quiet, even and easy respiratory effort with no use of accessory muscles and on auscultation, normal breath sounds, no adventitious sounds and normal vocal resonance. Inspection Chest Wall - Normal. Back - normal.  Breast Breast - Left-Symmetric, Non Tender,  No Biopsy scars, no Dimpling, No Inflammation, No Lumpectomy scars, No Mastectomy scars, No Peau d' Orange. Breast - Right-Symmetric, Non Tender, No Biopsy scars, no Dimpling, No Inflammation, No Lumpectomy scars, No Mastectomy scars, No Peau d' Orange. Breast Lump-No Palpable Breast Mass.  Cardiovascular Cardiovascular examination reveals -normal heart sounds, regular rate and rhythm with no murmurs and normal pedal pulses bilaterally.  Abdomen Inspection Inspection of the abdomen reveals - No Hernias. Skin - Scar - no surgical scars. Palpation/Percussion Palpation and Percussion of the abdomen reveal - Soft, Non Tender, No Rebound tenderness, No Rigidity (guarding) and No hepatosplenomegaly. Auscultation Auscultation of the abdomen reveals - Bowel sounds normal. Note: Scar noted upper abdomen. Right lower quadrant abdomen shows an area from an insect bite she states that is slightly erythematous. There is no abscess. This happened one week ago.  Neurologic Neurologic evaluation reveals -alert and oriented x 3 with no impairment of recent or remote memory. Mental Status-Normal.  Musculoskeletal Normal Exam - Left-Upper Extremity Strength Normal and Lower  Extremity Strength Normal. Normal Exam - Right-Upper Extremity Strength Normal and Lower Extremity Strength Normal.  Lymphatic Head & Neck  General Head & Neck Lymphatics: Bilateral - Description - Normal. Axillary  General Axillary Region: Bilateral - Description - Normal. Tenderness - Non Tender. Femoral & Inguinal  Generalized Femoral & Inguinal Lymphatics: Bilateral - Description - Normal. Tenderness - Non Tender.    Assessment & Plan (Sreya Froio A. Roselynne Lortz MD; 08/07/2016 2:57 PM)  BREAST CANCER, LEFT (C50.912) Impression: Discussed breast conservation versus mastectomy with reconstruction. Discussed the need for central lymph node mapping. Risks, benefits and long-term expectations of each discussed. Recurrence rates and survival statistics discussed. Risk of lumpectomy include bleeding, infection, seroma, more surgery, use of seed/wire, wound care, cosmetic deformity and the need for other treatments, death , blood clots, death. Pt agrees to proceed. Risk of sentinel lymph node mapping include bleeding, infection, lymphedema, shoulder pain. stiffness, dye allergy. cosmetic deformity , blood clots, death, need for more surgery. Pt agres to proceed.  Current Plans You are being scheduled for surgery - Our schedulers will call you.  You should hear from our office's scheduling department within 5 working days about the location, date, and time of surgery. We try to make accommodations for patient's preferences in scheduling surgery, but sometimes the OR schedule or the surgeon's schedule prevents [place] from making those accommodations.  If you have not heard from our office [phone]) in 5 working days, call the office and ask for your surgeon's nurse.  If you have other questions about your diagnosis, plan, or surgery, call the office and ask for your surgeon's nurse.  Pt Education - [facility] Breast Cancer Information Given - [employer] "Breast Journey" Package Pt Education - Pamphlet Given  - Breast Biopsy: discussed with patient and provided information. We discussed the staging and pathophysiology of breast cancer. We discussed all of the different options for treatment for breast cancer including surgery, chemotherapy, radiation therapy, Herceptin, and antiestrogen therapy. We discussed a sentinel lymph node biopsy as she does not appear to having lymph node involvement right now. We discussed the performance of that with injection of radioactive tracer and blue dye. We discussed that she would have an incision underneath her axillary hairline. We discussed that there is a bout a 10-20% chance of having a positive node with a sentinel lymph node biopsy and we will await the permanent pathology to make any other first further decisions in terms of her treatment. One of these  options might be to return to the operating room to perform an axillary lymph node dissection. We discussed about a 1-2% risk lifetime of chronic shoulder pain as well as lymphedema associated with a sentinel lymph node biopsy. We discussed the options for treatment of the breast cancer which included lumpectomy versus a mastectomy. We discussed the performance of the lumpectomy with a wire placement. We discussed a 10-20% chance of a positive margin requiring reexcision in the operating room. We also discussed that she may need radiation therapy or antiestrogen therapy or both if she undergoes lumpectomy. We discussed the mastectomy and the postoperative care for that as well. We discussed that there is no difference in her survival whether she undergoes lumpectomy with radiation therapy or antiestrogen therapy versus a mastectomy. There is a slight difference in the local recurrence rate being 3-5% with lumpectomy and about 1% with a mastectomy. We discussed the risks of operation including bleeding, infection, possible reoperation. She understands her further therapy will be based on what her stages at the time of her  operation.  Pt Education - flb breast cancer surgery: discussed with patient and provided information. Pt Education - [facility] Breast Biopsy HCI: discussed with patient and provided information. Pt Education - ABC (After Breast Cancer) Class Info: discussed with patient and provided information. CELLULITIS (L03.90) Impression: doxycycline 100 mg po bid x 10 days  Current Plans Started Bactrim DS 800-160MG, 1 (one) Tablet two times daily, #20, 08/07/2016, No Refill.

## 2016-08-24 NOTE — Transfer of Care (Signed)
Immediate Anesthesia Transfer of Care Note  Patient: Tracy Valentine  Procedure(s) Performed: Procedure(s): LEFT BREAST PARTIAL MASTECTOMY WITH RADIOACTIVE SEED AND LEFT SENTINEL LYMPH NODE MAPPING (Left)  Patient Location: PACU  Anesthesia Type:GA combined with regional for post-op pain  Level of Consciousness: sedated  Airway & Oxygen Therapy: Patient Spontanous Breathing and Patient connected to face mask oxygen  Post-op Assessment: Report given to RN and Post -op Vital signs reviewed and stable  Post vital signs: Reviewed and stable  Last Vitals:  Vitals:   08/24/16 1125 08/24/16 1130  BP: (!) 123/59 118/66  Pulse: 68 73  Resp: (!) 23 20  Temp:      Last Pain:  Vitals:   08/24/16 1056  TempSrc: Oral         Complications: No apparent anesthesia complications

## 2016-08-24 NOTE — Discharge Instructions (Signed)
[place] Office Phone Number [phone]  BREAST BIOPSY/ PARTIAL MASTECTOMY: POST OP INSTRUCTIONS  Always review your discharge instruction sheet given to you by the facility where your surgery was performed.  IF YOU HAVE DISABILITY OR FAMILY LEAVE FORMS, YOU MUST BRING THEM TO THE OFFICE FOR PROCESSING.  DO NOT GIVE THEM TO YOUR DOCTOR.  1. A prescription for pain medication may be given to you upon discharge.  Take your pain medication as prescribed, if needed.  If narcotic pain medicine is not needed, then you may take acetaminophen (Tylenol) or ibuprofen (Advil) as needed. 2. Take your usually prescribed medications unless otherwise directed 3. If you need a refill on your pain medication, please contact your pharmacy.  They will contact our office to request authorization.  Prescriptions will not be filled after 5pm or on week-ends.   Post Anesthesia Home Care Instructions  Activity: Get plenty of rest for the remainder of the day. A responsible adult should stay with you for 24 hours following the procedure.  For the next 24 hours, DO NOT: -Drive a car -Paediatric nurse -Drink alcoholic beverages -Take any medication unless instructed by your physician -Make any legal decisions or sign important papers.  Meals: Start with liquid foods such as gelatin or soup. Progress to regular foods as tolerated. Avoid greasy, spicy, heavy foods. If nausea and/or vomiting occur, drink only clear liquids until the nausea and/or vomiting subsides. Call your physician if vomiting continues.  Special Instructions/Symptoms: Your throat may feel dry or sore from the anesthesia or the breathing tube placed in your throat during surgery. If this causes discomfort, gargle with warm salt water. The discomfort should disappear within 24 hours.  If you had a scopolamine patch placed behind your ear for the management of post- operative nausea and/or vomiting:  1. The medication in  the patch is effective for 72 hours, after which it should be removed.  Wrap patch in a tissue and discard in the trash. Wash hands thoroughly with soap and water. 2. You may remove the patch earlier than 72 hours if you experience unpleasant side effects which may include dry mouth, dizziness or visual disturbances. 3. Avoid touching the patch. Wash your hands with soap and water after contact with the patch.    4. You should eat very light the first 24 hours after surgery, such as soup, crackers, pudding, etc.  Resume your normal diet the day after surgery. 5. Most patients will experience some swelling and bruising in the breast.  Ice packs and a good support bra will help.  Swelling and bruising can take several days to resolve.  6. It is common to experience some constipation if taking pain medication after surgery.  Increasing fluid intake and taking a stool softener will usually help or prevent this problem from occurring.  A mild laxative (Milk of Magnesia or Miralax) should be taken according to package directions if there are no bowel movements after 48 hours. 7. Unless discharge instructions indicate otherwise, you may remove your bandages 24-48 hours after surgery, and you may shower at that time.  You may have steri-strips (small skin tapes) in place directly over the incision.  These strips should be left on the skin for 7-10 days.  If your surgeon used skin glue on the incision, you may shower in 24 hours.  The glue will flake off over the next 2-3 weeks.  Any sutures or staples will be removed at the office during your follow-up visit. 8.  ACTIVITIES:  You may resume regular daily activities (gradually increasing) beginning the next day.  Wearing a good support bra or sports bra minimizes pain and swelling.  You may have sexual intercourse when it is comfortable. °a. You may drive when you no longer are taking prescription pain medication, you can comfortably wear a seatbelt, and you can  safely maneuver your car and apply brakes. °b. RETURN TO WORK:  ______________________________________________________________________________________ °9. You should see your doctor in the office for a follow-up appointment approximately two weeks after your surgery.  Your doctor’s nurse will typically make your follow-up appointment when she calls you with your pathology report.  Expect your pathology report 2-3 business days after your surgery.  You may call to check if you do not hear from us after three days. °10. OTHER INSTRUCTIONS: _______________________________________________________________________________________________ _____________________________________________________________________________________________________________________________________ °_____________________________________________________________________________________________________________________________________ °_____________________________________________________________________________________________________________________________________ ° °WHEN TO CALL YOUR DOCTOR: °1. Fever over 101.0 °2. Nausea and/or vomiting. °3. Extreme swelling or bruising. °4. Continued bleeding from incision. °5. Increased pain, redness, or drainage from the incision. ° °The clinic staff is available to answer your questions during regular business hours.  Please don’t hesitate to call and ask to speak to one of the nurses for clinical concerns.  If you have a medical emergency, go to the nearest emergency room or call 911.  A surgeon from [facility] is always on call at the hospital. ° °For further questions, please visit [email or web address]  °

## 2016-08-24 NOTE — Anesthesia Preprocedure Evaluation (Addendum)
Anesthesia Evaluation  Patient identified by MRN, date of birth, ID band Patient awake    Reviewed: Allergy & Precautions, NPO status , Patient's Chart, lab work & pertinent test results  Airway Mallampati: II  TM Distance: <3 FB Neck ROM: Full    Dental  (+) Teeth Intact, Dental Advisory Given   Pulmonary sleep apnea and Continuous Positive Airway Pressure Ventilation ,    Pulmonary exam normal breath sounds clear to auscultation       Cardiovascular Exercise Tolerance: Good negative cardio ROS Normal cardiovascular exam Rhythm:Regular Rate:Normal     Neuro/Psych PSYCHIATRIC DISORDERS Anxiety Depression Spinal stenosis     GI/Hepatic Neg liver ROS, GERD  Medicated,  Endo/Other  Hypothyroidism Morbid obesity  Renal/GU negative Renal ROS     Musculoskeletal  (+) Arthritis ,   Abdominal   Peds  Hematology negative hematology ROS (+)   Anesthesia Other Findings Day of surgery medications reviewed with the patient. Breast cancer  Reproductive/Obstetrics                            Anesthesia Physical Anesthesia Plan  ASA: III  Anesthesia Plan: General and Regional   Post-op Pain Management:  Regional for Post-op pain and GA combined w/ Regional for post-op pain   Induction: Intravenous  Airway Management Planned: LMA  Additional Equipment:   Intra-op Plan:   Post-operative Plan: Extubation in OR  Informed Consent: I have reviewed the patients History and Physical, chart, labs and discussed the procedure including the risks, benefits and alternatives for the proposed anesthesia with the patient or authorized representative who has indicated his/her understanding and acceptance.   Dental advisory given  Plan Discussed with: CRNA  Anesthesia Plan Comments: (Risks/benefits of general anesthesia discussed with patient including risk of damage to teeth, lips, gum, and tongue,  nausea/vomiting, allergic reactions to medications, and the possibility of heart attack, stroke and death.  All patient questions answered.  Patient wishes to proceed.  GA plus PEC block.)        Anesthesia Quick Evaluation

## 2016-08-24 NOTE — Progress Notes (Signed)
Nuc med staff preformed nuc med inj. Pt tol well with no additional sedation required. Will bring family to room and update/provide emotional support

## 2016-08-24 NOTE — Anesthesia Procedure Notes (Signed)
Procedure Name: LMA Insertion Date/Time: 08/24/2016 12:28 PM Performed by: Toula Moos L Pre-anesthesia Checklist: Patient identified, Emergency Drugs available, Suction available, Patient being monitored and Timeout performed Patient Re-evaluated:Patient Re-evaluated prior to inductionOxygen Delivery Method: Circle system utilized Preoxygenation: Pre-oxygenation with 100% oxygen Intubation Type: IV induction Ventilation: Mask ventilation without difficulty LMA: LMA inserted LMA Size: 4.0 Number of attempts: 1 Airway Equipment and Method: Bite block Placement Confirmation: positive ETCO2 Tube secured with: Tape Dental Injury: Teeth and Oropharynx as per pre-operative assessment

## 2016-08-25 ENCOUNTER — Encounter (HOSPITAL_BASED_OUTPATIENT_CLINIC_OR_DEPARTMENT_OTHER): Payer: Self-pay | Admitting: Surgery

## 2016-08-27 ENCOUNTER — Encounter (HOSPITAL_COMMUNITY): Payer: Self-pay | Admitting: Hematology & Oncology

## 2016-08-27 DIAGNOSIS — C50412 Malignant neoplasm of upper-outer quadrant of left female breast: Secondary | ICD-10-CM | POA: Insufficient documentation

## 2016-08-28 ENCOUNTER — Telehealth (HOSPITAL_COMMUNITY): Payer: Self-pay | Admitting: Emergency Medicine

## 2016-08-28 ENCOUNTER — Encounter (HOSPITAL_COMMUNITY): Payer: Self-pay | Admitting: Emergency Medicine

## 2016-08-28 NOTE — Telephone Encounter (Signed)
Called pt to introduce myself.  Let her know that I sent off for the Oncotype testing.  It may take about 24-28 days for these results to come back since she has medicare.  I made her follow up appt for 09/28/2016 at 9:10 am.  Pt has an appt to see Dr Isidore Moos on 09/12/2016.     Oncotype sent off.  Called pathology spoke to [facility] to verify that they received it.  [employer] prior auth sent. Endo predict sent.

## 2016-08-29 MED ORDER — BUPIVACAINE-EPINEPHRINE (PF) 0.5% -1:200000 IJ SOLN
INTRAMUSCULAR | Status: DC | PRN
  Administered 2016-08-24: 30 mL via PERINEURAL

## 2016-08-29 NOTE — Addendum Note (Signed)
Addendum  created 08/29/16 1233 by Catalina Gravel, MD   Anesthesia Intra Blocks edited, Anesthesia Intra Meds edited, Child order released for a procedure order, Sign clinical note

## 2016-08-29 NOTE — H&P (Signed)
Tracy Valentine 08/07/2016 2:13 PM Location: [facility] Patient #: [id number] DOB: 05-09-1956 Married / Language: English / Race: White Female  History of Present Illness [name] A. Dayne Chait MD; 08/07/2016 2:53 PM) Patient words: Patient sent at the request of Dr. Evette Cristal for a left breast mammographic abnormality. This was picked up on screening mammogram. There is an area of architectural distortion left breast upper outer quadrant and a second area in the lower breast. Both areas were biopsied. The area in the left upper outer quadrant was consistent with invasive ductal carcinoma grade 3 ER positive PR negative HER-2/neu negative with a Ki-67 of 70%. Patient denies any history of breast pain, breast mass or nipple discharge. No family history of breast cancer. There is a family history of ovarian and uterine cancer.                       Study Result  CLINICAL DATA: Recall from screening mammography. EXAM: 2D DIGITAL DIAGNOSTIC LEFT MAMMOGRAM WITH CAD AND ADJUNCT TOMO ULTRASOUND LEFT BREAST COMPARISON: Previous exam(s). ACR Breast Density Category b: There are scattered areas of fibroglandular density. FINDINGS: Additional views of the left breast with tomosynthesis demonstrate a persistent area of asymmetry with distortion to be present within the anterior portion of the upper-outer quadrant of the left breast at approximately the 1 to 2 o'clock position. Mammographic images were processed with CAD. On physical exam, there is no discrete palpable abnormality in the area of mammographic concern. Targeted ultrasound is performed, showing no definite ultrasound correlate for the mammographic finding. IMPRESSION: Area of developing asymmetry with distortion located within the upper-outer quadrant left breast. Tissue sampling via tomosynthesis guided biopsy is recommended and will be scheduled. RECOMMENDATION: Left  breast tomosynthesis guided biopsy. This has been scheduled for 07/28/2016. I have discussed the findings and recommendations with the patient. Results were also provided in writing at the conclusion of the visit. If applicable, a reminder letter will be sent to the patient regarding the next appointment. BI-RADS CATEGORY 4: Suspicious. Electronically Signed By: Altamese Cabal M.D. On: 07/26/2016 12:45            ADDITIONAL INFORMATION: 2. FLUORESCENCE IN-SITU HYBRIDIZATION Results: HER2 - NEGATIVE RATIO OF HER2/CEP17 SIGNALS 1.03 AVERAGE HER2 COPY NUMBER PER CELL 2.00 Reference Range: NEGATIVE HER2/CEP17 Ratio <2.0 and average HER2 copy number <4.0 EQUIVOCAL HER2/CEP17 Ratio <2.0 and average HER2 copy number >=4.0 and <6.0 POSITIVE HER2/CEP17 Ratio >=2.0 or <2.0 and average HER2 copy number >=6.0 Claudette Laws MD Pathologist, Electronic Signature ( Signed 08/03/2016) 2. PROGNOSTIC INDICATORS Results: IMMUNOHISTOCHEMICAL AND MORPHOMETRIC ANALYSIS PERFORMED MANUALLY Estrogen Receptor: 80%, POSITIVE, MODERATE STAINING INTENSITY Progesterone Receptor: 0%, NEGATIVE Proliferation Marker Ki67: 70% COMMENT: The negative hormone receptor study(ies) in this case has an internal positive control. REFERENCE RANGE ESTROGEN RECEPTOR NEGATIVE 0% POSITIVE =>1% REFERENCE RANGE PROGESTERONE RECEPTOR 1 of 3 FINAL for WAYLON, KOFFLER ([id number]) ADDITIONAL INFORMATION:(continued) NEGATIVE 0% POSITIVE =>1% All controls stained appropriately Enid Cutter MD Pathologist, Electronic Signature ( Signed 08/01/2016) FINAL DIAGNOSIS Diagnosis 1. Breast, left, needle core biopsy, outer - BENIGN BREAST TISSUE, SEE COMMENT. - NO MALIGNANCY IDENTIFIED. 2. Breast, left, needle core biopsy, upper outer quadrant - INVASIVE DUCTAL CARCINOMA, SEE COMMENT. - HIGH GRADE DUCTAL CARCINOMA IN SITU WITH NECROSIS.  The patient is a 60 year old female.   Other Problems  [name], CMA; 08/07/2016 2:13 PM) Anxiety Disorder Arthritis Back Pain Cholelithiasis Depression Gastroesophageal Reflux Disease Hemorrhoids Hypercholesterolemia Kidney Stone Migraine Headache Sleep Apnea  Thyroid Disease  Past Surgical History [name], CMA; 08/07/2016 2:13 PM) Breast Biopsy Left. Gallbladder Surgery - Open Hysterectomy (not due to cancer) - Partial  Diagnostic Studies History [name], CMA; 08/07/2016 2:13 PM) Colonoscopy >10 years ago Mammogram within last year Pap Smear 1-5 years ago  Allergies [name], CMA; 08/07/2016 2:14 PM) Darvocet A500 *ANALGESICS - OPIOID* Azithromycin *CHEMICALS* Amoxicillin *PENICILLINS* Doxycycline *DERMATOLOGICALS*  Medication History [name], CMA; 08/07/2016 2:16 PM) Levothyroxine Sodium (100MCG Tablet, Oral) Active. Montelukast Sodium (10MG Tablet, Oral) Active. Simvastatin (20MG Tablet, Oral) Active. ALPRAZolam (1MG Tablet, Oral) Active. Effexor (75MG Tablet, Oral two times daily) Active. Ambien (10MG Tablet, Oral) Active. OxyCODONE HCl (10MG Tablet, Oral) Active. Aspirin (81MG Tablet, Oral) Active. Vitamin D3 (1000UNIT Tablet, Oral) Active. Stool Softener (100MG Capsule, Oral) Active. Medications Reconciled  Social History [name], [place]; 08/07/2016 2:13 PM) Caffeine use Tea. No alcohol use No drug use Tobacco use Never smoker.  Family History [name], [place]; 08/07/2016 2:13 PM) Alcohol Abuse Mother. Arthritis Father. Cervical Cancer Mother. Depression Mother. Diabetes Mellitus Mother. Heart Disease Father. Ovarian Cancer Mother. Respiratory Condition Mother.  Pregnancy / Birth History [name], CMA; 08/07/2016 2:13 PM) Age at menarche 76 years. Age of menopause 77-50 Gravida 1 Maternal age 41-20 Para 1     Review of Systems [name] CMA; 08/07/2016 2:13 PM) General Not Present- Appetite Loss,  Chills, Fatigue, Fever, Night Sweats, Weight Gain and Weight Loss. Skin Not Present- Change in Wart/Mole, Dryness, Hives, Jaundice, New Lesions, Non-Healing Wounds, Rash and Ulcer. HEENT Present- Seasonal Allergies. Not Present- Earache, Hearing Loss, Hoarseness, Nose Bleed, Oral Ulcers, Ringing in the Ears, Sinus Pain, Sore Throat, Visual Disturbances, Wears glasses/contact lenses and Yellow Eyes. Respiratory Present- Snoring. Not Present- Bloody sputum, Chronic Cough, Difficulty Breathing and Wheezing. Breast Not Present- Breast Mass, Breast Pain, Nipple Discharge and Skin Changes. Cardiovascular Not Present- Chest Pain, Difficulty Breathing Lying Down, Leg Cramps, Palpitations, Rapid Heart Rate, Shortness of Breath and Swelling of Extremities. Gastrointestinal Present- Constipation. Not Present- Abdominal Pain, Bloating, Bloody Stool, Change in Bowel Habits, Chronic diarrhea, Difficulty Swallowing, Excessive gas, Gets full quickly at meals, Hemorrhoids, Indigestion, Nausea, Rectal Pain and Vomiting. Female Genitourinary Not Present- Frequency, Nocturia, Painful Urination, Pelvic Pain and Urgency. Musculoskeletal Present- Back Pain and Joint Stiffness. Not Present- Joint Pain, Muscle Pain, Muscle Weakness and Swelling of Extremities. Neurological Present- Tingling. Not Present- Decreased Memory, Fainting, Headaches, Numbness, Seizures, Tremor, Trouble walking and Weakness. Psychiatric Present- Anxiety and Depression. Not Present- Bipolar, Change in Sleep Pattern, Fearful and Frequent crying. Hematology Present- Easy Bruising. Not Present- Blood Thinners, Excessive bleeding, Gland problems, HIV and Persistent Infections.  Vitals [name] CMA; 08/07/2016 2:16 PM) 08/07/2016 2:16 PM Weight: 254.8 lb Height: 65in Body Surface Area: 2.19 m Body Mass Index: 42.4 kg/m  Temp.: 97.7F(Temporal)  Pulse: 76 (Regular)  BP: 130/76 (Sitting, Left Arm, Standard)      Physical Exam  (Darly Massi A. Auron Tadros MD; 08/07/2016 2:53 PM)  General Mental Status-Alert. General Appearance-Consistent with stated age. Hydration-Well hydrated. Voice-Normal.  Head and Neck Head-normocephalic, atraumatic with no lesions or palpable masses. Trachea-midline. Thyroid Gland Characteristics - normal size and consistency.  Eye Eyeball - Bilateral-Extraocular movements intact. Sclera/Conjunctiva - Bilateral-No scleral icterus.  Chest and Lung Exam Chest and lung exam reveals -quiet, even and easy respiratory effort with no use of accessory muscles and on auscultation, normal breath sounds, no adventitious sounds and normal vocal resonance. Inspection Chest Wall - Normal. Back - normal.  Breast Breast - Left-Symmetric, Non Tender,  No Biopsy scars, no Dimpling, No Inflammation, No Lumpectomy scars, No Mastectomy scars, No Peau d' Orange. Breast - Right-Symmetric, Non Tender, No Biopsy scars, no Dimpling, No Inflammation, No Lumpectomy scars, No Mastectomy scars, No Peau d' Orange. Breast Lump-No Palpable Breast Mass.  Cardiovascular Cardiovascular examination reveals -normal heart sounds, regular rate and rhythm with no murmurs and normal pedal pulses bilaterally.  Abdomen Inspection Inspection of the abdomen reveals - No Hernias. Skin - Scar - no surgical scars. Palpation/Percussion Palpation and Percussion of the abdomen reveal - Soft, Non Tender, No Rebound tenderness, No Rigidity (guarding) and No hepatosplenomegaly. Auscultation Auscultation of the abdomen reveals - Bowel sounds normal. Note: Scar noted upper abdomen. Right lower quadrant abdomen shows an area from an insect bite she states that is slightly erythematous. There is no abscess. This happened one week ago.  Neurologic Neurologic evaluation reveals -alert and oriented x 3 with no impairment of recent or remote memory. Mental Status-Normal.  Musculoskeletal Normal Exam -  Left-Upper Extremity Strength Normal and Lower Extremity Strength Normal. Normal Exam - Right-Upper Extremity Strength Normal and Lower Extremity Strength Normal.  Lymphatic Head & Neck  General Head & Neck Lymphatics: Bilateral - Description - Normal. Axillary  General Axillary Region: Bilateral - Description - Normal. Tenderness - Non Tender. Femoral & Inguinal  Generalized Femoral & Inguinal Lymphatics: Bilateral - Description - Normal. Tenderness - Non Tender.    Assessment & Plan (Besan Ketchem A. Danarius Mcconathy MD; 08/07/2016 2:57 PM)  BREAST CANCER, LEFT (C50.912) Impression: Discussed breast conservation versus mastectomy with reconstruction. Discussed the need for central lymph node mapping. Risks, benefits and long-term expectations of each discussed. Recurrence rates and survival statistics discussed. Risk of lumpectomy include bleeding, infection, seroma, more surgery, use of seed/wire, wound care, cosmetic deformity and the need for other treatments, death , blood clots, death. Pt agrees to proceed. Risk of sentinel lymph node mapping include bleeding, infection, lymphedema, shoulder pain. stiffness, dye allergy. cosmetic deformity , blood clots, death, need for more surgery. Pt agres to proceed.  Current Plans You are being scheduled for surgery - Our schedulers will call you.  You should hear from our office's scheduling department within 5 working days about the location, date, and time of surgery. We try to make accommodations for patient's preferences in scheduling surgery, but sometimes the OR schedule or the surgeon's schedule prevents [place] from making those accommodations.  If you have not heard from our office [phone]) in 5 working days, call the office and ask for your surgeon's nurse.  If you have other questions about your diagnosis, plan, or surgery, call the office and ask for your surgeon's nurse.  Pt Education - [facility] Breast Cancer Information Given -  [facility] "Breast Journey" Package Pt Education - Pamphlet Given - Breast Biopsy: discussed with patient and provided information. We discussed the staging and pathophysiology of breast cancer. We discussed all of the different options for treatment for breast cancer including surgery, chemotherapy, radiation therapy, Herceptin, and antiestrogen therapy. We discussed a sentinel lymph node biopsy as she does not appear to having lymph node involvement right now. We discussed the performance of that with injection of radioactive tracer and blue dye. We discussed that she would have an incision underneath her axillary hairline. We discussed that there is a bout a 10-20% chance of having a positive node with a sentinel lymph node biopsy and we will await the permanent pathology to make any other first further decisions in terms of her treatment. One of these  options might be to return to the operating room to perform an axillary lymph node dissection. We discussed about a 1-2% risk lifetime of chronic shoulder pain as well as lymphedema associated with a sentinel lymph node biopsy. We discussed the options for treatment of the breast cancer which included lumpectomy versus a mastectomy. We discussed the performance of the lumpectomy with a wire placement. We discussed a 10-20% chance of a positive margin requiring reexcision in the operating room. We also discussed that she may need radiation therapy or antiestrogen therapy or both if she undergoes lumpectomy. We discussed the mastectomy and the postoperative care for that as well. We discussed that there is no difference in her survival whether she undergoes lumpectomy with radiation therapy or antiestrogen therapy versus a mastectomy. There is a slight difference in the local recurrence rate being 3-5% with lumpectomy and about 1% with a mastectomy. We discussed the risks of operation including bleeding, infection, possible reoperation. She understands her further  therapy will be based on what her stages at the time of her operation.  Pt Education - flb breast cancer surgery: discussed with patient and provided information. Pt Education - [facility] Breast Biopsy HCI: discussed with patient and provided information. Pt Education - ABC (After Breast Cancer) Class Info: discussed with patient and provided information. CELLULITIS (L03.90) Impression: doxycycline 100 mg po bid x 10 days  Current Plans Started Bactrim DS 800-160MG, 1 (one) Tablet two times daily, #20, 08/07/2016, No Refill.    Routing History

## 2016-08-29 NOTE — Anesthesia Procedure Notes (Addendum)
Anesthesia Regional Block:  Pectoralis block  Pre-Anesthetic Checklist: ,, timeout performed, Correct Patient, Correct Site, Correct Laterality, Correct Procedure, Correct Position, site marked, Risks and benefits discussed,  Surgical consent,  Pre-op evaluation,  At surgeon's request and post-op pain management  Laterality: Left  Prep: chloraprep       Needles:  Injection technique: Single-shot  Needle Type: Echogenic Needle     Needle Length: 9cm 9 cm Needle Gauge: 21 and 21 G    Additional Needles:  Procedures: ultrasound guided (picture in chart) Pectoralis block Narrative:  Start time: 08/24/2016 12:04 PM End time: 08/24/2016 12:14 PM Injection made incrementally with aspirations every 5 mL.  Performed by: Personally  Anesthesiologist: Catalina Gravel  Additional Notes: No pain on injection. No increased resistance to injection. Injection made in 5cc increments.  Good needle visualization.  Patient tolerated procedure well.

## 2016-09-01 ENCOUNTER — Encounter (HOSPITAL_COMMUNITY): Payer: Self-pay | Admitting: Emergency Medicine

## 2016-09-04 ENCOUNTER — Encounter (HOSPITAL_COMMUNITY): Payer: Self-pay | Admitting: Emergency Medicine

## 2016-09-04 NOTE — Progress Notes (Signed)
Called [employer] to start the prior authorization that they required bc they wouldn't let [facility] start it for the Oncotype.

## 2016-09-11 ENCOUNTER — Telehealth (HOSPITAL_COMMUNITY): Payer: Self-pay | Admitting: Emergency Medicine

## 2016-09-11 NOTE — Telephone Encounter (Signed)
Pt called and was asking if we knew what medication we were going to start her on yet?  I explained that her oncotype had not come back yet.  It was expected anytime now.  She said that she had heard the pills were really expensive.  I asked who she heard this from.  She said one of her friends had been on medication.  I told her not to listen to anyone else's stories.  That everyone would have a story to tell. That if she had questions to please call me.  She verbalized understanding.  I explained about the endocrine therapy that she would be placed on.  But we need the onco type results back first and as soon as these come back I would let her know.

## 2016-09-12 DIAGNOSIS — F329 Major depressive disorder, single episode, unspecified: Secondary | ICD-10-CM | POA: Diagnosis not present

## 2016-09-12 DIAGNOSIS — C50912 Malignant neoplasm of unspecified site of left female breast: Secondary | ICD-10-CM | POA: Diagnosis not present

## 2016-09-12 DIAGNOSIS — I10 Essential (primary) hypertension: Secondary | ICD-10-CM | POA: Diagnosis not present

## 2016-09-12 DIAGNOSIS — Z170 Estrogen receptor positive status [ER+]: Secondary | ICD-10-CM | POA: Diagnosis not present

## 2016-09-12 DIAGNOSIS — E669 Obesity, unspecified: Secondary | ICD-10-CM | POA: Diagnosis not present

## 2016-09-12 DIAGNOSIS — C50412 Malignant neoplasm of upper-outer quadrant of left female breast: Secondary | ICD-10-CM | POA: Diagnosis not present

## 2016-09-12 DIAGNOSIS — E785 Hyperlipidemia, unspecified: Secondary | ICD-10-CM | POA: Diagnosis not present

## 2016-09-12 DIAGNOSIS — F419 Anxiety disorder, unspecified: Secondary | ICD-10-CM | POA: Diagnosis not present

## 2016-09-12 DIAGNOSIS — Z8041 Family history of malignant neoplasm of ovary: Secondary | ICD-10-CM | POA: Diagnosis not present

## 2016-09-12 DIAGNOSIS — Z803 Family history of malignant neoplasm of breast: Secondary | ICD-10-CM | POA: Diagnosis not present

## 2016-09-12 DIAGNOSIS — Z79899 Other long term (current) drug therapy: Secondary | ICD-10-CM | POA: Diagnosis not present

## 2016-09-13 DIAGNOSIS — M1711 Unilateral primary osteoarthritis, right knee: Secondary | ICD-10-CM | POA: Diagnosis not present

## 2016-09-13 DIAGNOSIS — M47812 Spondylosis without myelopathy or radiculopathy, cervical region: Secondary | ICD-10-CM | POA: Diagnosis not present

## 2016-09-13 DIAGNOSIS — M47816 Spondylosis without myelopathy or radiculopathy, lumbar region: Secondary | ICD-10-CM | POA: Diagnosis not present

## 2016-09-13 DIAGNOSIS — G894 Chronic pain syndrome: Secondary | ICD-10-CM | POA: Diagnosis not present

## 2016-09-13 DIAGNOSIS — M5416 Radiculopathy, lumbar region: Secondary | ICD-10-CM | POA: Diagnosis not present

## 2016-09-18 DIAGNOSIS — C50912 Malignant neoplasm of unspecified site of left female breast: Secondary | ICD-10-CM | POA: Diagnosis not present

## 2016-09-21 ENCOUNTER — Ambulatory Visit (INDEPENDENT_AMBULATORY_CARE_PROVIDER_SITE_OTHER): Payer: Medicare Other | Admitting: Otolaryngology

## 2016-09-21 DIAGNOSIS — J310 Chronic rhinitis: Secondary | ICD-10-CM | POA: Diagnosis not present

## 2016-09-21 DIAGNOSIS — J343 Hypertrophy of nasal turbinates: Secondary | ICD-10-CM | POA: Diagnosis not present

## 2016-09-21 DIAGNOSIS — J342 Deviated nasal septum: Secondary | ICD-10-CM

## 2016-09-22 ENCOUNTER — Encounter (HOSPITAL_COMMUNITY): Payer: Self-pay

## 2016-09-28 ENCOUNTER — Encounter (HOSPITAL_COMMUNITY): Payer: Self-pay

## 2016-09-28 ENCOUNTER — Encounter (HOSPITAL_COMMUNITY): Payer: Medicare Other | Attending: Hematology & Oncology | Admitting: Hematology & Oncology

## 2016-09-28 ENCOUNTER — Encounter (HOSPITAL_COMMUNITY): Payer: Medicare Other

## 2016-09-28 ENCOUNTER — Encounter (HOSPITAL_COMMUNITY)
Admission: RE | Admit: 2016-09-28 | Discharge: 2016-09-28 | Disposition: A | Discharge status: Home / Self Care | Payer: Medicare Other | Source: Ambulatory Visit | Attending: General Surgery | Admitting: General Surgery

## 2016-09-28 ENCOUNTER — Encounter (HOSPITAL_COMMUNITY): Payer: Self-pay | Admitting: Hematology & Oncology

## 2016-09-28 VITALS — BP 148/68 | HR 67 | Temp 98.6°F | Resp 16 | Ht 66.0 in | Wt 250.0 lb

## 2016-09-28 DIAGNOSIS — Z452 Encounter for adjustment and management of vascular access device: Secondary | ICD-10-CM | POA: Diagnosis not present

## 2016-09-28 DIAGNOSIS — C50412 Malignant neoplasm of upper-outer quadrant of left female breast: Secondary | ICD-10-CM

## 2016-09-28 DIAGNOSIS — Z170 Estrogen receptor positive status [ER+]: Secondary | ICD-10-CM

## 2016-09-28 DIAGNOSIS — E876 Hypokalemia: Secondary | ICD-10-CM | POA: Insufficient documentation

## 2016-09-28 DIAGNOSIS — E7800 Pure hypercholesterolemia, unspecified: Secondary | ICD-10-CM | POA: Diagnosis not present

## 2016-09-28 DIAGNOSIS — Z91048 Other nonmedicinal substance allergy status: Secondary | ICD-10-CM | POA: Diagnosis not present

## 2016-09-28 DIAGNOSIS — F419 Anxiety disorder, unspecified: Secondary | ICD-10-CM | POA: Diagnosis not present

## 2016-09-28 DIAGNOSIS — Z79899 Other long term (current) drug therapy: Secondary | ICD-10-CM | POA: Diagnosis not present

## 2016-09-28 DIAGNOSIS — Z825 Family history of asthma and other chronic lower respiratory diseases: Secondary | ICD-10-CM | POA: Diagnosis not present

## 2016-09-28 DIAGNOSIS — Z886 Allergy status to analgesic agent status: Secondary | ICD-10-CM | POA: Diagnosis not present

## 2016-09-28 DIAGNOSIS — Z6841 Body Mass Index (BMI) 40.0 and over, adult: Secondary | ICD-10-CM | POA: Diagnosis not present

## 2016-09-28 DIAGNOSIS — B370 Candidal stomatitis: Secondary | ICD-10-CM | POA: Insufficient documentation

## 2016-09-28 DIAGNOSIS — K219 Gastro-esophageal reflux disease without esophagitis: Secondary | ICD-10-CM | POA: Diagnosis not present

## 2016-09-28 DIAGNOSIS — C50912 Malignant neoplasm of unspecified site of left female breast: Secondary | ICD-10-CM | POA: Diagnosis not present

## 2016-09-28 DIAGNOSIS — Z8249 Family history of ischemic heart disease and other diseases of the circulatory system: Secondary | ICD-10-CM | POA: Diagnosis not present

## 2016-09-28 DIAGNOSIS — L089 Local infection of the skin and subcutaneous tissue, unspecified: Secondary | ICD-10-CM | POA: Insufficient documentation

## 2016-09-28 DIAGNOSIS — Z880 Allergy status to penicillin: Secondary | ICD-10-CM | POA: Diagnosis not present

## 2016-09-28 DIAGNOSIS — R110 Nausea: Secondary | ICD-10-CM | POA: Insufficient documentation

## 2016-09-28 DIAGNOSIS — G4730 Sleep apnea, unspecified: Secondary | ICD-10-CM | POA: Diagnosis not present

## 2016-09-28 DIAGNOSIS — Z7982 Long term (current) use of aspirin: Secondary | ICD-10-CM | POA: Diagnosis not present

## 2016-09-28 DIAGNOSIS — R300 Dysuria: Secondary | ICD-10-CM | POA: Insufficient documentation

## 2016-09-28 HISTORY — DX: Personal history of urinary calculi: Z87.442

## 2016-09-28 HISTORY — DX: Other specified postprocedural states: R11.2

## 2016-09-28 HISTORY — DX: Hypothyroidism, unspecified: E03.9

## 2016-09-28 HISTORY — DX: Other specified postprocedural states: Z98.890

## 2016-09-28 HISTORY — DX: Other intervertebral disc degeneration, lumbosacral region: M51.37

## 2016-09-28 HISTORY — DX: Other intervertebral disc degeneration, lumbosacral region without mention of lumbar back pain or lower extremity pain: M51.379

## 2016-09-28 HISTORY — DX: Other intervertebral disc degeneration, lumbar region: M51.36

## 2016-09-28 LAB — BASIC METABOLIC PANEL
Anion gap: 7 (ref 5–15)
BUN: 17 mg/dL (ref 6–20)
CHLORIDE: 100 mmol/L — AB (ref 101–111)
CO2: 29 mmol/L (ref 22–32)
CREATININE: 0.89 mg/dL (ref 0.44–1.00)
Calcium: 10.4 mg/dL — ABNORMAL HIGH (ref 8.9–10.3)
GFR calc non Af Amer: >60 mL/min (ref >60)
Glucose, Bld: 88 mg/dL (ref 65–99)
POTASSIUM: 4 mmol/L (ref 3.5–5.1)
Sodium: 136 mmol/L (ref 135–145)

## 2016-09-28 LAB — CBC WITH DIFFERENTIAL/PLATELET
Basophils Absolute: 0 10*3/uL (ref 0.0–0.1)
Basophils Relative: 1 %
EOS ABS: 0.1 10*3/uL (ref 0.0–0.7)
Eosinophils Relative: 2 %
HEMATOCRIT: 43.6 % (ref 36.0–46.0)
HEMOGLOBIN: 14.5 g/dL (ref 12.0–15.0)
LYMPHS ABS: 3.1 10*3/uL (ref 0.7–4.0)
LYMPHS PCT: 39 %
MCH: 32.9 pg (ref 26.0–34.0)
MCHC: 33.3 g/dL (ref 30.0–36.0)
MCV: 98.9 fL (ref 78.0–100.0)
MONOS PCT: 6 %
Monocytes Absolute: 0.5 10*3/uL (ref 0.1–1.0)
NEUTROS PCT: 52 %
Neutro Abs: 4.3 10*3/uL (ref 1.7–7.7)
Platelets: 296 10*3/uL (ref 150–400)
RBC: 4.41 MIL/uL (ref 3.87–5.11)
RDW: 13 % (ref 11.5–15.5)
WBC: 8.1 10*3/uL (ref 4.0–10.5)

## 2016-09-28 MED ORDER — PROCHLORPERAZINE MALEATE 10 MG PO TABS: 10.0000 mg | ORAL_TABLET | Freq: Four times a day (QID) | ORAL | 2 refills | Status: DC | PRN

## 2016-09-28 MED ORDER — DEXAMETHASONE 4 MG PO TABS: ORAL_TABLET | ORAL | 1 refills | Status: DC

## 2016-09-28 MED ORDER — ONDANSETRON HCL 8 MG PO TABS: 8.0000 mg | ORAL_TABLET | Freq: Three times a day (TID) | ORAL | 2 refills | Status: DC | PRN

## 2016-09-28 MED ORDER — LIDOCAINE-PRILOCAINE 2.5-2.5 % EX CREA: TOPICAL_CREAM | CUTANEOUS | 3 refills | Status: DC

## 2016-09-28 NOTE — Patient Instructions (Addendum)
[place] at [facility] Discharge Instructions  RECOMMENDATIONS MADE BY THE CONSULTANT AND ANY TEST RESULTS WILL BE SENT TO YOUR REFERRING PHYSICIAN.  You saw Dr.Penland today. Going to schedule port placement. Return to clinic 1 week after chemo. Chemo teaching with Shellia Carwin. See Amy at checkout for appointments.   Thank you for choosing [place] at [facility] to provide your oncology and hematology care.  To afford each patient quality time with our provider, please arrive at least 15 minutes before your scheduled appointment time.   Beginning January 23rd 2017 lab work for the [facility] will be done in the  Main lab at [facility] on 1st floor. If you have a lab appointment with the [facility] please come in thru the  Main Entrance and check in at the main information desk  You need to re-schedule your appointment should you arrive 10 or more minutes late.  We strive to give you quality time with our providers, and arriving late affects you and other patients whose appointments are after yours.  Also, if you no show three or more times for appointments you may be dismissed from the clinic at the providers discretion.     Again, thank you for choosing [facility].  Our hope is that these requests will decrease the amount of time that you wait before being seen by our physicians.       _____________________________________________________________  Should you have questions after your visit to [facility], please contact our office at [phone] between the hours of 8:30 a.m. and 4:30 p.m.  Voicemails left after 4:30 p.m. will not be returned until the following business day.  For prescription refill requests, have your pharmacy contact our office.         Resources For Cancer Patients and their Caregivers ? American Cancer Society: Can assist with transportation, wigs, general needs, runs  Look Good Feel Better.        2547201032 ? Cancer Care: Provides financial assistance, online support groups, medication/co-pay assistance.  1-800-813-HOPE (807)181-2075) ? [facility] Assists [place] cancer patients and their families through emotional , educational and financial support.  (251) 053-1066 ? [place] DSS Where to apply for food stamps, Medicaid and utility assistance. 817-287-7851 ? [facility]: Transportation to medical appointments. 334-132-6281 ? Social Security Administration: May apply for disability if have a Stage IV cancer. 7265663608 563-357-4122 ? [employer], Disability and Transit Services: Assists with nutrition, care and transit needs. [place] Support Programs: @10RELATIVEDAYS @ > Cancer Support Group  2nd Tuesday of the month 1pm-2pm, Journey Room  > Creative Journey  3rd Tuesday of the month 1130am-1pm, Journey Room  > Look Good Feel Better  1st Wednesday of the month 10am-12 noon, Journey Room (Call [facility] to register [phone])

## 2016-09-28 NOTE — H&P (Signed)
NTS SOAP Note  Vital Signs:  Vitals as of: 123XX123: Systolic [id number]: Diastolic 88: Heart Rate 65: Temp 98.17F (Temporal): Height 12ft 6in: Weight 250Lbs 0 Ounces: BMI 40.35   BMI : 40.35 kg/m2  Subjective: This 60 year old female presents for of need for central venous access for left breast cancer.  Referred by Dr. Whitney Muse of Oncology,.  Review of Symptoms:  Constitutional:fatigue headache Eyes:pain bilateral sinus problems Cardiovascular:negative Respiratory:negative Gastrointestinnegative Genitourinary:negative joint, neck, and back pain Skin:negative Hematolgic/Lymphatic:negative Allergic/Immunologic:negative   Past Medical History:Reviewed  Past Medical History  Surgical History: TAH, left partial mastectomy with sentinel lymph node, radiactive seed placement 11/17 Medical Problems: left breast cancer, anxiety, high cholesterol, sleep apnea, spinal stenosis, GERD, obesity Allergies: tape, amoxicillin, darvocet, doxycycline, erythromycin Medications: xanax, asa, atrovent, synthroid, singulair, zocor, effexor, ambien   Social History:Reviewed  Social History  Preferred Language: English Race:  White Ethnicity: Not Hispanic / Latino Age: 48 year Marital Status:  M Alcohol: no   Smoking Status: Never smoker reviewed on 09/28/2016 Functional Status reviewed on 09/28/2016 ------------------------------------------------ Bathing: Normal Cooking: Normal Dressing: Normal Driving: Normal Eating: Normal Managing Meds: Normal Oral Care: Normal Shopping: Normal Toileting: Normal Transferring: Normal Walking: Normal Cognitive Status reviewed on 09/28/2016 ------------------------------------------------ Attention: Normal Decision Making: Normal Language: Normal Memory: Normal Motor: Normal Perception: Normal Problem Solving: Normal Visual and Spatial: Normal   Family History:Reviewed  Family Health History Mother, Deceased; Chronic  obstructive lung disease (COPD); Cancer unspecified;  Father, Deceased; Heart attack (myocardial infarction);     Objective Information: General:Well appearing, well nourished in no distress. Head:Atraumatic; no masses; no abnormalities Neck:Supple without lymphadenopathy.  Heart:RRR, no murmur or gallop.  Normal S1, S2.  No S3, S4.  Lungs:CTA bilaterally, no wheezes, rhonchi, rales.  Breathing unlabored. Dr. Donald Pore notes reviewed. Assessment:Left breast cancer, need for central venous access  Diagnoses: 174.9  C50.912 Primary malignant neoplasm of female breast (Malignant neoplasm of unspecified site of left female breast)  Procedures: [phone] - OFFICE OUTPATIENT NEW 20 MINUTES    Plan:  Scheduled for portacath insertion on 08/30/16.   Patient Education:Alternative treatments to surgery were discussed with patient (and family).Risks and benefits  of procedure including bleeding, infection, and pneumothorax were fully explained to the patient (and family) who gave informed consent. Patient/family questions were addressed.  Follow-up:Pending Surgery

## 2016-09-28 NOTE — Patient Instructions (Signed)
ARIELI MINNIG  09/28/2016     @PREFPERIOPPHARMACY @   Your procedure is scheduled on  09/29/2016   Report to [facility] at  730 A.M.  Call this number if you have problems the morning of surgery:  [phone]   Remember:  Do not eat food or drink liquids after midnight.  Take these medicines the morning of surgery with A SIP OF WATER  Xanax, prevacid, levothyroxine, mobic, singulair, oxycodone, effexor.   Do not wear jewelry, make-up or nail polish.  Do not wear lotions, powders, or perfumes, or deoderant.  Do not shave 48 hours prior to surgery.  Men may shave face and neck.  Do not bring valuables to the hospital.  [facility] is not responsible for any belongings or valuables.  Contacts, dentures or bridgework may not be worn into surgery.  Leave your suitcase in the car.  After surgery it may be brought to your room.  For patients admitted to the hospital, discharge time will be determined by your treatment team.  Patients discharged the day of surgery will not be allowed to drive home.   Name and phone number of your driver:   family Special instructions:  none  Please read over the following fact sheets that you were given. Anesthesia Post-op Instructions and Care and Recovery After Surgery       Implanted Port Insertion An implanted port is a central line that has a round shape and is placed under the skin. It is used as a long-term IV access for:   Medicines, such as chemotherapy.   Fluids.   Liquid nutrition, such as total parenteral nutrition (TPN).   Blood samples.  LET [facility] CARE PROVIDER KNOW ABOUT:  Allergies to food or medicine.   Medicines taken, including vitamins, herbs, eye drops, creams, and over-the-counter medicines.   Any allergies to heparin.  Use of steroids (by mouth or creams).   Previous problems with anesthetics or numbing medicines.   History of bleeding problems or blood clots.   Previous  surgery.   Other health problems, including diabetes and kidney problems.   Possibility of pregnancy, if this applies. RISKS AND COMPLICATIONS Generally, this is a safe procedure. However, as with any procedure, problems can occur. Possible problems include:  Damage to the blood vessel, bruising, or bleeding at the puncture site.   Infection.  Blood clot in the vessel that the port is in.  Breakdown of the skin over your port.  Very rarely a person may develop a condition called a pneumothorax, a collection of air in the chest that may cause one of the lungs to collapse. The placement of these catheters with the appropriate imaging guidance significantly decreases the risk of a pneumothorax.  BEFORE THE PROCEDURE   Your health care provider may want you to have blood tests. These tests can help tell how well your kidneys and liver are working. They can also show how well your blood clots.   If you take blood thinners (anticoagulant medicines), ask your health care provider when you should stop taking them.   Make arrangements for someone to drive you home. This is necessary if you have been sedated for your procedure.  PROCEDURE  Port insertion usually takes about 30-45 minutes.   An IV needle will be inserted in your arm. Medicine for pain and medicine to help relax you (sedative) will flow directly into your body through this needle.  You will lie on an exam table, and you will be connected to monitors to keep track of your heart rate, blood pressure, and breathing throughout the procedure.  An oxygen monitoring device may be attached to your finger. Oxygen will be given.   Everything will be kept as germ free (sterile) as possible during the procedure. The skin near the point of the incision will be cleansed with antiseptic, and the area will be draped with sterile towels. The skin and deeper tissues over the port area will be made numb with a local anesthetic.  Two  small cuts (incisions) will be made in the skin to insert the port. One will be made in the neck to obtain access to the vein where the catheter will lie.   Because the port reservoir will be placed under the skin, a small skin incision will be made in the upper chest, and a small pocket for the port will be made under the skin. The catheter that will be connected to the port tunnels to a large central vein in the chest. A small, raised area will remain on your body at the site of the reservoir when the procedure is complete.  The port placement will be done under imaging guidance to ensure the proper placement.  The reservoir has a silicone covering that can be punctured with a special needle.   The port will be flushed with normal saline, and blood will be drawn to make sure it is working properly.  There will be nothing remaining outside the skin when the procedure is finished.   Incisions will be held together by stitches, surgical glue, or a special tape. AFTER THE PROCEDURE  You will stay in a recovery area until the anesthesia has worn off. Your blood pressure and pulse will be checked.  A final chest X-ray will be taken to check the placement of the port and to ensure that there is no injury to your lung. This information is not intended to replace advice given to you by your health care provider. Make sure you discuss any questions you have with your health care provider. Document Released: 07/30/2013 Document Revised: 10/30/2014 Document Reviewed: 07/30/2013 Elsevier Interactive Patient Education  2017 [place] Insertion, Care After Refer to this sheet in the next few weeks. These instructions provide you with information on caring for yourself after your procedure. Your health care provider may also give you more specific instructions. Your treatment has been planned according to current medical practices, but problems sometimes occur. Call your health care  provider if you have any problems or questions after your procedure. WHAT TO EXPECT AFTER THE PROCEDURE After your procedure, it is typical to have the following:   Discomfort at the port insertion site. Ice packs to the area will help.  Bruising on the skin over the port. This will subside in 3-4 days. HOME CARE INSTRUCTIONS  After your port is placed, you will get a manufacturer's information card. The card has information about your port. Keep this card with you at all times.   Know what kind of port you have. There are many types of ports available.   Wear a medical alert bracelet in case of an emergency. This can help alert health care workers that you have a port.   The port can stay in for as long as your health care provider believes it is necessary.   A home health care nurse may give medicines and take care of  the port.   You or a family member can get special training and directions for giving medicine and taking care of the port at home.  SEEK MEDICAL CARE IF:   Your port does not flush or you are unable to get a blood return.   You have a fever or chills. SEEK IMMEDIATE MEDICAL CARE IF:  You have new fluid or pus coming from your incision.   You notice a bad smell coming from your incision site.   You have swelling, pain, or more redness at the incision or port site.   You have chest pain or shortness of breath. This information is not intended to replace advice given to you by your health care provider. Make sure you discuss any questions you have with your health care provider. Document Released: 07/30/2013 Document Revised: 10/14/2013 Document Reviewed: 07/30/2013 Elsevier Interactive Patient Education  2017 [place] An implanted port is a type of central line that is placed under the skin. Central lines are used to provide IV access when treatment or nutrition needs to be given through a person's veins. Implanted ports  are used for long-term IV access. An implanted port may be placed because:   You need IV medicine that would be irritating to the small veins in your hands or arms.   You need long-term IV medicines, such as antibiotics.   You need IV nutrition for a long period.   You need frequent blood draws for lab tests.   You need dialysis.  Implanted ports are usually placed in the chest area, but they can also be placed in the upper arm, the abdomen, or the leg. An implanted port has two main parts:   Reservoir. The reservoir is round and will appear as a small, raised area under your skin. The reservoir is the part where a needle is inserted to give medicines or draw blood.   Catheter. The catheter is a thin, flexible tube that extends from the reservoir. The catheter is placed into a large vein. Medicine that is inserted into the reservoir goes into the catheter and then into the vein.  HOW WILL I CARE FOR MY INCISION SITE? Do not get the incision site wet. Bathe or shower as directed by your health care provider.  HOW IS MY PORT ACCESSED? Special steps must be taken to access the port:   Before the port is accessed, a numbing cream can be placed on the skin. This helps numb the skin over the port site.   Your health care provider uses a sterile technique to access the port.  Your health care provider must put on a mask and sterile gloves.  The skin over your port is cleaned carefully with an antiseptic and allowed to dry.  The port is gently pinched between sterile gloves, and a needle is inserted into the port.  Only "non-coring" port needles should be used to access the port. Once the port is accessed, a blood return should be checked. This helps ensure that the port is in the vein and is not clogged.   If your port needs to remain accessed for a constant infusion, a clear (transparent) bandage will be placed over the needle site. The bandage and needle will need to be changed  every week, or as directed by your health care provider.   Keep the bandage covering the needle clean and dry. Do not get it wet. Follow your health care provider's instructions on how  to take a shower or bath while the port is accessed.   If your port does not need to stay accessed, no bandage is needed over the port.  WHAT IS FLUSHING? Flushing helps keep the port from getting clogged. Follow your health care provider's instructions on how and when to flush the port. Ports are usually flushed with saline solution or a medicine called heparin. The need for flushing will depend on how the port is used.   If the port is used for intermittent medicines or blood draws, the port will need to be flushed:   After medicines have been given.   After blood has been drawn.   As part of routine maintenance.   If a constant infusion is running, the port may not need to be flushed.  HOW LONG WILL MY PORT STAY IMPLANTED? The port can stay in for as long as your health care provider thinks it is needed. When it is time for the port to come out, surgery will be done to remove it. The procedure is similar to the one performed when the port was put in.  WHEN SHOULD I SEEK IMMEDIATE MEDICAL CARE? When you have an implanted port, you should seek immediate medical care if:   You notice a bad smell coming from the incision site.   You have swelling, redness, or drainage at the incision site.   You have more swelling or pain at the port site or the surrounding area.   You have a fever that is not controlled with medicine. This information is not intended to replace advice given to you by your health care provider. Make sure you discuss any questions you have with your health care provider. Document Released: 10/09/2005 Document Revised: 07/30/2013 Document Reviewed: 06/16/2013 Elsevier Interactive Patient Education  2017 [employer] PATIENT INSTRUCTIONS POST-ANESTHESIA  IMMEDIATELY  FOLLOWING SURGERY:  Do not drive or operate machinery for the first twenty four hours after surgery.  Do not make any important decisions for twenty four hours after surgery or while taking narcotic pain medications or sedatives.  If you develop intractable nausea and vomiting or a severe headache please notify your doctor immediately.  FOLLOW-UP:  Please make an appointment with your surgeon as instructed. You do not need to follow up with anesthesia unless specifically instructed to do so.  WOUND CARE INSTRUCTIONS (if applicable):  Keep a dry clean dressing on the anesthesia/puncture wound site if there is drainage.  Once the wound has quit draining you may leave it open to air.  Generally you should leave the bandage intact for twenty four hours unless there is drainage.  If the epidural site drains for more than 36-48 hours please call the anesthesia department.  QUESTIONS?:  Please feel free to call your physician or the hospital operator if you have any questions, and they will be happy to assist you.

## 2016-09-28 NOTE — Progress Notes (Signed)
START ON PATHWAY REGIMEN - Breast  BOS174: TC - Docetaxel, Cyclophosphamide q21 Days x 4 Cycles   A cycle is every 21 days:     Docetaxel (Taxotere(R)) 75 mg/m2 in 250 mL NS IV over 1 hour, q21 days; followed by Dose Mod: None     Cyclophosphamide (Cytoxan(R)) 600 mg/m2 in 250 mL NS IV over 30 minutes, q21 days Dose Mod: None Additional Orders: Premedicate with dexamethasone 8 mg PO bid for three days beginning 1 day prior to therapy  **Always confirm dose/schedule in your pharmacy ordering system**    Patient Characteristics: Adjuvant Therapy, Node Negative, HER2/neu Negative/Unknown/Equivocal, ER Positive, Oncotype Intermediate Risk (18 - 30), Chemotherapy Preferred AJCC Stage Grouping: IA Current Disease Status: No Distant Mets or Local Recurrence AJCC M Stage: 0 ER Status: Positive (+) AJCC N Stage: 0 AJCC T Stage: 1c HER2/neu: Negative (-) PR Status: Negative (-) Node Status: Negative (-) Has this patient completed genomic testing? Yes - Oncotype DX(R) Oncotype Recurrence Score: 30 Treatment Preferred: Chemotherapy  Intent of Therapy: Curative Intent, Discussed with Patient 

## 2016-09-28 NOTE — Progress Notes (Signed)
[name]  PROGRESS NOTE  Patient Care Team: [employer], MD as PCP - General (Internal Medicine)  CHIEF COMPLAINTS/PURPOSE OF CONSULTATION:  Left breast cancer ER+ PR- HER 2 -    Breast cancer of upper-outer quadrant of left female breast ([facility])   07/26/2016 Mammogram    Area of developing asymmetry with distortion located within the upper-outer quadrant left breast. Tissue sampling via tomosynthesis guided biopsy is recommended and will be scheduled.  RECOMMENDATION: Left breast tomosynthesis guided biopsy. This has been scheduled for 07/28/2016.       07/28/2016 Initial Biopsy    Coil shaped clip corresponds to the asymmetry in the outer left breast. Clip is well positioned at the biopsy site. 2. X shaped clip corresponds to the ASYMMETRY/DISTORTION in the upper-outer quadrant of the left breast. Clip is positioned approximately 1 cm medial to the center of the biopsy cavity      07/28/2016 Pathology Results    Estrogen Receptor: 80%, POSITIVE, MODERATE STAINING INTENSITY Progesterone Receptor: 0%, NEGATIVE Proliferation Marker Ki67: 70% HER 2 negative by FISH Breast, left, needle core biopsy, upper outer quadrant - INVASIVE DUCTAL CARCINOMA, SEE COMMENT. - HIGH GRADE DUCTAL CARCINOMA IN SITU WITH NECROSIS.      08/24/2016 Oncotype testing    Recurrence Score Result 30, 10 year risk of distant recurrence Tamoxifen alone 20%      HISTORY OF PRESENTING ILLNESS:  Tracy Valentine 60 y.o. female is here for a follow-up of left breast cancer ER+/PR-/HER2-. Ki67 is 70%. Final surgical pathology revealed a stage I ER+ 80% , PR - HER 2 - infiltrating ductal carcinoma.   Patient is accompanied by her friend, Madaline Savage.   Oncotype results are back for review and she presents today to go over them in the clinic.  She notes that she is healing well from surgery. No significant pain.  Mood is good.  She remarks that she will do chemotherapy if there is  significant benefit for her.   MEDICAL HISTORY:  Past Medical History:  Diagnosis Date  . Anxiety   . Breast cancer (HCC)    breast  . Chronic pain   . DDD (degenerative disc disease), cervical   . DDD (degenerative disc disease), lumbar   . Degenerative disc disease at L5-S1 level   . Depression   . GERD (gastroesophageal reflux disease)   . History of kidney stones   . Hyperlipidemia   . Hypothyroidism   . Obesity   . Osteoarthritis   . PONV (postoperative nausea and vomiting)   . Sleep apnea    uses CPAP  . Spinal stenosis   . Spinal stenosis     SURGICAL HISTORY: Past Surgical History:  Procedure Laterality Date  . ABDOMINAL HYSTERECTOMY     partial hyst, still have ovaries  . BREAST LUMPECTOMY WITH RADIOACTIVE SEED AND SENTINEL LYMPH NODE BIOPSY Left 08/24/2016   Procedure: LEFT BREAST PARTIAL MASTECTOMY WITH RADIOACTIVE SEED AND LEFT SENTINEL LYMPH NODE MAPPING;  Surgeon: Erroll Luna, MD;  Location: [facility];  Service: General;  Laterality: Left;  . CHOLECYSTECTOMY    . FOOT FOREIGN BODY REMOVAL Left   . PORTACATH PLACEMENT Right 09/29/2016   Procedure: INSERTION PORT-A-CATH RIGHT SUBCLAVIAN;  Surgeon: Aviva Signs, MD;  Location: [facility];  Service: General;  Laterality: Right;    SOCIAL HISTORY: Social History   Social History  . Marital status: Married    Spouse name: N/A  . Number of children: N/A  . Years  of education: N/A   Occupational History  . Not on file.   Social History Main Topics  . Smoking status: Never Smoker  . Smokeless tobacco: Never Used  . Alcohol use No  . Drug use: No  . Sexual activity: Yes    Birth control/ protection: None     Comment: married-1 grown son   Other Topics Concern  . Not on file   Social History Narrative  . No narrative on file  1 child; No grandchildren  Never smoked No alcohol Has pets (cat and three dogs)  FAMILY HISTORY: Family History  Problem Relation Age of Onset  . COPD  Mother   . Heart attack Father   . Diabetes Sister   . Heart disease Sister   . Kidney cancer Brother   . Breast cancer Cousin   . Kidney failure Sister   . Thyroid disease Sister     Half sister and 2 half brother. 1 full brother. No Breast cancer in immediate family. Paternal cousin had breast cancer. Mother diagnosed with ovarian cancer at 43 years. Father passed from heart attack.   ALLERGIES:  is allergic to adhesive [tape]; amoxicillin; darvocet [propoxyphene n-acetaminophen]; doxycycline; erythromycin; and zithromax [azithromycin].  MEDICATIONS:  Current Outpatient Prescriptions  Medication Sig Dispense Refill  . ALPRAZolam (XANAX) 1 MG tablet Take 1 mg by mouth 4 (four) times daily as needed for sleep.    [name] Kitchen aspirin 325 MG tablet Take 325 mg by mouth daily.    . Cholecalciferol (VITAMIN D) 2000 UNITS CAPS Take 2,000 Units by mouth daily.    . Cyclophosphamide (CYTOXAN IJ) Inject as directed every 21 ( twenty-one) days. To start 10/03/16    . DOCEtaxel (TAXOTERE IV) Inject into the vein every 21 ( twenty-one) days. To start 10/03/16    . docusate sodium (COLACE) 50 MG capsule Take 100 mg by mouth 2 (two) times daily.    [name] Kitchen KRILL OIL PO Take 1 capsule by mouth daily.    [name] Kitchen levothyroxine (SYNTHROID, LEVOTHROID) 100 MCG tablet Take 100 mcg by mouth daily.  5  . meloxicam (MOBIC) 7.5 MG tablet   0  . montelukast (SINGULAIR) 10 MG tablet Take 10 mg by mouth daily.  11  . Oxycodone HCl 10 MG TABS   0  . Pegfilgrastim (NEULASTA ONPRO [place]) Inject into the skin. To be administered 27 hours after the completion of chemo every 21 days.    [name] Kitchen senna (SENOKOT) 8.6 MG TABS tablet Take 1 tablet by mouth at bedtime as needed.    . simvastatin (ZOCOR) 20 MG tablet Take 20 mg by mouth every evening.    . venlafaxine (EFFEXOR) 75 MG tablet Take 75 mg by mouth 2 (two) times daily.    [name] Kitchen zolpidem (AMBIEN) 10 MG tablet Take 10 mg by mouth at bedtime.    . clindamycin (CLEOCIN) 300 MG capsule Take 1  capsule (300 mg total) by mouth 4 (four) times daily. (Patient not taking: Reported on 11/14/2016) 40 capsule 0  . dexamethasone (DECADRON) 4 MG tablet The day before, day of, and day after chemo take 2 tablets in the am and 2 tablets in the pm. Take with food. 36 tablet 1  . Diphenhyd-Hydrocort-Nystatin (FIRST-DUKES MOUTHWASH) SUSP Use as directed 5 mLs in the mouth or throat 4 (four) times daily as needed. 300 mL 3  . fluconazole (DIFLUCAN) 100 MG tablet Take 1 tablet (100 mg total) by mouth daily. (Patient not taking: Reported on 11/14/2016) 10 tablet 1  .  fluticasone (FLONASE) 50 MCG/ACT nasal spray Place 1 spray into both nostrils daily.    [name] Kitchen ipratropium (ATROVENT) 0.03 % nasal spray 1 spray daily.  4  . lidocaine-prilocaine (EMLA) cream Apply a quarter size amount to port site 1 hour prior to chemo. Do not rub in. Cover with plastic wrap. 30 g 3  . nystatin (MYCOSTATIN) 100000 UNIT/ML suspension   2  . ondansetron (ZOFRAN) 8 MG tablet Take 1 tablet (8 mg total) by mouth every 8 (eight) hours as needed for nausea or vomiting. 30 tablet 2  . potassium chloride SA (K-DUR,KLOR-CON) 20 MEQ tablet Take 2 tablets (40 mEq total) by mouth daily. 60 tablet 0  . prochlorperazine (COMPAZINE) 10 MG tablet Take 1 tablet (10 mg total) by mouth every 6 (six) hours as needed for nausea or vomiting. 30 tablet 2  . sulfamethoxazole-trimethoprim (BACTRIM DS,SEPTRA DS) 800-160 MG tablet   0   No current facility-administered medications for this visit.     Review of Systems  Constitutional: Negative.   HENT: Negative.   Eyes: Negative.   Respiratory: Negative.   Cardiovascular: Negative.   Gastrointestinal: Negative.   Genitourinary: Negative.   Musculoskeletal: Negative.   Skin: Negative.   Neurological: Negative.   Endo/Heme/Allergies: Negative.   Psychiatric/Behavioral: Negative.   All other systems reviewed and are negative. 14 point ROS was done and is otherwise as detailed above or in  HPI  PHYSICAL EXAMINATION: ECOG PERFORMANCE STATUS: 0 - Asymptomatic  Vitals:   09/28/16 0946  BP: (!) 148/68  Pulse: 67  Resp: 16  Temp: 98.6 F (37 C)   Filed Weights   09/28/16 0946  Weight: 250 lb (113.4 kg)   Physical Exam  Constitutional: She is oriented to person, place, and time and well-developed, well-nourished, and in no distress.  HENT:  Head: Normocephalic and atraumatic.  Nose: Nose normal.  Mouth/Throat: Oropharynx is clear and moist. No oropharyngeal exudate.  Eyes: Conjunctivae and EOM are normal. Pupils are equal, round, and reactive to light. Right eye exhibits no discharge. Left eye exhibits no discharge. No scleral icterus.  Neck: Normal range of motion. Neck supple. No tracheal deviation present. No thyromegaly present.  Cardiovascular: Normal rate, regular rhythm and normal heart sounds.  Exam reveals no gallop and no friction rub.   No murmur heard. Pulmonary/Chest: Effort normal and breath sounds normal. She has no wheezes. She has no rales.  Abdominal: Soft. Bowel sounds are normal. She exhibits no distension and no mass. There is no tenderness. There is no rebound and no guarding.  Musculoskeletal: Normal range of motion. She exhibits no edema.  Lymphadenopathy:    She has no cervical adenopathy.  Neurological: She is alert and oriented to person, place, and time. She has normal reflexes. No cranial nerve deficit. Gait normal. Coordination normal.  Skin: Skin is warm and dry. No rash noted.  Psychiatric: Mood, memory, affect and judgment normal.  Nursing note and vitals reviewed.   LABORATORY DATA:  I have reviewed the data as listed  RADIOGRAPHIC STUDIES: I have personally reviewed the radiological images as listed and agreed with the findings in the report.  Study Result   CLINICAL DATA:  Status post 2 stereotactic guided biopsies of the left breast earlier today.  EXAM: DIAGNOSTIC LEFT MAMMOGRAM POST STEREOTACTIC BIOPSY  COMPARISON:   Previous exam(s).  FINDINGS: Mammographic images were obtained following stereotactic guided biopsy of an asymmetry within the outer left breast and an asymmetry/distortion in the upper outer quadrant of the left  breast. Coil shaped clip corresponds to the asymmetry in the outer left breast. X shaped clip corresponds to the asymmetry/distortion in the upper-outer quadrant of the left breast.  IMPRESSION: Postprocedure mammogram for clip placement.  1. Coil shaped clip corresponds to the asymmetry in the outer left breast. Clip is well positioned at the biopsy site. 2. X shaped clip corresponds to the ASYMMETRY/DISTORTION in the upper-outer quadrant of the left breast. Clip is positioned approximately 1 cm medial to the center of the biopsy cavity.  Final Assessment: Post Procedure Mammograms for Marker Placement   Electronically Signed   By: Franki Cabot M.D.   On: 07/28/2016 11:54   Addendum   ADDENDUM REPORT: 07/31/2016 13:53  ADDENDUM: Pathology revealed BENIGN BREAST TISSUE of the Left breast, outer. GRADE III INVASIVE DUCTAL CARCINOMA, HIGH GRADE DUCTAL CARCINOMA IN SITU WITH NECROSIS of the Left breast, upper outer quadrant. This was found to be concordant by Dr. Franki Cabot. Pathology results were discussed with the patient by telephone. The patient reported doing well after the biopsies with tenderness at the sites. Post biopsy instructions and care were reviewed and questions were answered. The patient was encouraged to call [facility] for any additional concerns. Surgical consultation has been arranged with Dr. Erroll Luna at [facility] on August 07, 2016. The patient is scheduled for a Left axillary ultrasound at [facility] on August 07, 2016.  Pathology results reported by Terie Purser, RN on 07/31/2016.   Electronically Signed   By: Franki Cabot M.D.   On: 07/31/2016 13:53   Addended by  Michiel Cowboy, MD on 07/31/2016 2:04 PM    Study Result   CLINICAL DATA:  Patient with a left breast asymmetry/distortion presenting for stereotactic guided biopsy.  EXAM: LEFT BREAST STEREOTACTIC CORE NEEDLE BIOPSY x2  COMPARISON:  Previous exams.  FINDINGS: The patient and I discussed the procedure of stereotactic-guided biopsy including benefits and alternatives. We discussed the high likelihood of a successful procedure. We discussed the risks of the procedure including infection, bleeding, tissue injury, clip migration, and inadequate sampling. Informed written consent was given. The usual time out protocol was performed immediately prior to the procedure.  Using sterile technique and 1% Lidocaine as local anesthetic, under stereotactic guidance, a 9 gauge vacuum assisted device was used to perform core needle biopsy of an asymmetry in the outer left breast using a superior approach.  At the conclusion of the procedure, a COIL SHAPED tissue marker was placed at the biopsy site. Postprocedure diagnostic mammogram showed the coil shaped clip to correspond to the site of a left breast asymmetry identified on the CC projection, but not corresponding to the additional area of asymmetry/distortion in the upper left breast.  As such, patient was prepped for a second stereotactic guided biopsy for the additional asymmetry/distortion in the upper left breast.  Using sterile technique and 1% lidocaine as local anesthetic, under stereotactic guidance, a 9 gauge vacuum assisted device was used to perform core needle biopsy of the asymmetry/distortion in the upper-outer quadrant of the left breast using a lateral approach.  At the conclusion of the procedure, an X SHAPED tissue marker was placed at the biopsy site. Postprocedure mammogram showed the X shaped clip to correspond to the site of the ASYMMETRY/DISTORTION in the upper-outer quadrant of the left breast,  approximately 1 cm medial to the center of the biopsy cavity.  IMPRESSION: 1. Stereotactic guided biopsy of the asymmetry in the outer left breast.  COIL SHAPED tissue marker placed at the biopsy site. 2. Stereotactic guided biopsy of the additional ASYMMETRY/DISTORTION in the upper-outer quadrant of the left breast. X shaped tissue marker placed at the biopsy site. This biopsy clip is approximately 1 cm medial to the center of the biopsy site.  Electronically Signed: By: Franki Cabot M.D. On: 07/28/2016 11:34      PATHOLOGY:            ASSESSMENT & PLAN:  Stage I L breast cancer L breast Cancer ER+ PR- HER 2 - Oncotype recurrence score 30  We discussed her oncotype. She has a 20% risk of distant occurrence in ten years with endocrine therapy alone. With chemotherapy, her ten year risk is reduced to 6%. Patient agreed to do chemotherapy. I explained that she will have four treatments, once every three weeks. Laconda prefers to have a port rather than a PICC line. I will have her set up for port placement, we will refer her back to [place] to her surgeon.   She will meet with Anderson Malta, our patient navigator, for chemotherapy teaching. Plan will be for 4 cycles of TC.  Patient was given a copy of her oncotype. We reviewed this in detail. Results are noted above.  Bennette showed me a stitch under left arm that has not yet dissolved. If it does not dissolve by her next visit, I told her I would remove it.  She can also show it to her surgeon at her follow-up visit.  Orders Placed This Encounter  Procedures  . Comprehensive metabolic panel    Standing Status:   Standing    Number of Occurrences:   6    Standing Expiration Date:   09/28/2017  . CBC with Differential    Standing Status:   Standing    Number of Occurrences:   6    Standing Expiration Date:   09/28/2017    This document serves as a record of services personally performed by Ancil Linsey, MD. It was created  on her behalf by Elmyra Ricks, a trained medical scribe. The creation of this record is based on the scribe's personal observations and the provider's statements to them. This document has been checked and approved by the attending provider.  I have reviewed the above documentation for accuracy and completeness and I agree with the above.  This note was electronically signed.    Molli Hazard, MD  11/20/2016 2:00 PM

## 2016-09-28 NOTE — Progress Notes (Signed)
Chemo teaching done and consent signed for Taxotere & Cytoxan. Consent placed in scanning box. Chemo and You and Eating Hints book given to patient. Care package made by [facility] given to patient. Premeds called in to drug store. I explained to patient how to take Dexamethasone the day before, day of, and day after chemo. Patient given a tour of department. Patient met with Anderson Malta the Navigator. Patient to get port placed tomorrow @ [facility].  After realizing that the patient was only going to receive 4 cycles of chemo - I called the pharmacy and changed the Dex count to #48 with no refills.

## 2016-09-28 NOTE — Patient Instructions (Addendum)
[place]   CHEMOTHERAPY INSTRUCTIONS  Premeds: Aloxi - high powered nausea/vomiting prevention medication used for chemotherapy patients. Dexamethasone - steroid - given to reduce the risk of you developing fluid retention from the Taxotere chemotherapy. Dex can cause you to feel energized, nervous/anxious/jittery, make you have trouble sleeping, and/or make you feel hot/flushed in the face/neck and/or look pink/red in the face/neck. These side effects will pass as the Dex wears off. (takes 20 minutes to infuse)   Cytoxan - can cause hemorrhagic cystitis (bloody urine) - this chemo irritates your bladder! We need you drinking 64 oz of fluid (preferably water/decaff fluids) 2 days prior to chemo and for up to 4-5 days after chemo. Drink more if you can. Do not hold your urine. Urinate before you go to bed and if you wake up in the middle of the night. This can also cause nausea/vomiting and hair loss. (takes 30 minutes to infuse)  Taxotere - bone marrow suppression (lowers white blood cells (fight infection), lowers red blood cells (make up your blood), lowers platelets (help blood to clot). This chemo can cause fluid retention. You will be responsible for taking a steroid called Dexamethasone at home the day before, day of and day after Taxotere. This steroid will keep you from having fluid retention. Take it whether you think you need it or not. Can cause hair loss, skin/nail changes (darkening of the nail beds, pain where the nail bed meets the skin, loosening of the nail beds, dry skin, palms of hands and soles of feet may darken or get sensitive, nausea/vomiting, paresthesia (numbness or tingling) in extremities - we need to know if this develops, mucositis (inflammation of any mucosal membrane - the mouth, throat), mouth sores, neurotoxicity (loss of memory, headaches, trouble sleeping, etc.), can also cause excessive tear production. Please let us know if any side  effect develops. (takes 1 hour to infuse)  Neulasta - this medication is not chemo but being given because you have had chemo. It is usually given 27 hours after the completion of chemotherapy. This medication works by boosting your bone marrow's supply of white blood cells. White blood cells are what protect our bodies against infection. The medication is given in the form of a subcutaneous injection. It is given in the fatty tissue of your abdomen. It is a short needle. The major side effect of this medication is bone or muscle pain. The drug of choice to relieve or lessen the pain is Aleve or Ibuprofen. If a physician has ever told you not to take Aleve or Ibuprofen - then don't take it. You should then take Tylenol/acetaminophen. Take either medication as the bottle directs you to.  The level of pain you experience as a result of this injection can range from none, to mild or moderate, or severe. Please let us know if you develop moderate or severe bone pain.     POTENTIAL SIDE EFFECTS OF TREATMENT: Increased Susceptibility to Infection, Nausea/Vomiting, Constipation/Diarrhea, Hair Thinning/Hair Loss, Changes in Character of Skin and Nails (brittleness, dryness,etc.), Pigment Changes (darkening of nail beds, palms of hands, soles of feet, etc.), Bone Marrow Suppression, Abdominal Cramping, Sun Sensitivity and Mouth Sores     SELF IMAGE NEEDS AND REFERRALS MADE: Obtain hair accessories as soon as possible (wigs, scarves, turbans,caps,etc.)   Referral to Look Good, Feel Better consultant - may or may not be here in the month of February 2018 - please inquire about this during the month of  January 2018   EDUCATIONAL MATERIALS GIVEN AND REVIEWED: Chemotherapy and You book/Eating Hints book given Specific Instructions Sheets: Taxotere, Cytoxan, Aloxi, Dexamethasone, Neulasta   SELF CARE ACTIVITIES WHILE ON CHEMOTHERAPY: Increase your fluid intake 48 hours prior to treatment and drink at least 2  quarts per day after treatment., No alcohol intake., No aspirin or other medications unless approved by your oncologist., Eat foods that are light and easy to digest., Eat foods at cold or room temperature., No fried, fatty, or spicy foods immediately before or after treatment., Have teeth cleaned professionally before starting treatment. Keep dentures and partial plates clean., Use soft toothbrush and do not use mouthwashes that contain alcohol. Biotene is a good mouthwash that is available at most pharmacies or may be ordered by calling [phone]., Use warm salt water gargles (1 teaspoon salt per 1 quart warm water) before and after meals and at bedtime. Or you may rinse with 2 tablespoons of three -percent hydrogen peroxide mixed in eight ounces of water., Always use sunscreen with SPF (Sun Protection Factor) of 30 or higher., Use your nausea medication as directed to prevent nausea., Use your stool softener or laxative as directed to prevent constipation. and Use your anti-diarrheal medication as directed to stop diarrhea.  Please wash your hands for at least 30 seconds using warm soapy water. Handwashing is the #1 way to prevent the spread of germs. Stay away from sick people or people who are getting over a cold. If you develop respiratory systems such as green/yellow mucus production or productive cough or persistent cough let us know and we will see if you need an antibiotic. It is a good idea to keep a pair of gloves on when going into grocery stores/[employer] to decrease your risk of coming into contact with germs on the carts, etc. Carry alcohol hand gel with you at all times and use it frequently if out in public. All foods need to be cooked thoroughly. No raw foods. No medium or undercooked meats, eggs. If your food is cooked medium well, it does not need to be hot pink or saturated with bloody liquid at all. Vegetables and fruits need to be washed/rinsed under the faucet with a dish detergent  before being consumed. You can eat raw fruits and vegetables unless we tell you otherwise but it would be best if you cooked them or bought frozen. Do not eat off of salad bars or hot bars unless you really trust the cleanliness of the restaurant. If you need dental work, please let Dr. Whitney Muse know before you go for your appointment so that we can coordinate the best possible time for you in regards to your chemo regimen. You need to also let your dentist know that you are actively taking chemo. We may need to do labs prior to your dental appointment. We also want your bowels moving at least every other day. If this is not happening, we need to know so that we can get you on a bowel regimen to help you go.       MEDICATIONS: You have been given prescriptions for the following medications:   Dexamethasone 4mg  tablet. The day before, day of, and day after chemo take 2 tabs in the am and 2 tabs in the pm. Take with food.  Zofran/Ondansetron 8mg  tablet. Take 1 tablet every 8 hours as needed for nausea/vomiting. (#1 nausea med to take, this can constipate)  Compazine/Prochlorperazine 10mg  tablet. Take 1 tablet every 6 hours as needed for  nausea/vomiting. (#2 nausea med to take, this can make you sleepy)   EMLA cream. Apply a quarter size amount to port site 1 hour prior to chemo. Do not rub in. Cover with plastic wrap.   Over-the-Counter Meds:  Miralax 1 capful in 8 oz of fluid daily. May increase to two times a day if needed. This is a stool softener. If this doesn't work proceed you can add:  Senokot S  - start with 1 tablet two times a day and increase to 4 tablets two times a day if needed. (total of 8 tablets in a 24 hour period). This is a stimulant laxative.   Call us if this does not help your bowels move.   Imodium 2mg  capsule. Take 2 capsules after the 1st loose stool and then 1 capsule every 2 hours until you go a total of 12 hours without having a loose stool. Call the [place] if loose stools continue. If diarrhea occurs @ bedtime, take 2 capsules @ bedtime. Then take 2 capsules every 4 hours until morning. Call [facility].   SYMPTOMS TO REPORT AS SOON AS POSSIBLE AFTER TREATMENT:  FEVER GREATER THAN 100.5 F  CHILLS WITH OR WITHOUT FEVER  NAUSEA AND VOMITING THAT IS NOT CONTROLLED WITH YOUR NAUSEA MEDICATION  UNUSUAL SHORTNESS OF BREATH  UNUSUAL BRUISING OR BLEEDING  TENDERNESS IN MOUTH AND THROAT WITH OR WITHOUT PRESENCE OF ULCERS  URINARY PROBLEMS  BOWEL PROBLEMS  UNUSUAL RASH    Wear comfortable clothing and clothing appropriate for easy access to any Portacath or PICC line. Let us know if there is anything that we can do to make your therapy better!      I have been informed and understand all of the instructions given to me and have received a copy. I have been instructed to call the clinic [phone] or my family physician as soon as possible for continued medical care, if indicated. I do not have any more questions at this time but understand that I may call the [facility] or the Patient Navigator at [phone] during office hours should I have questions or need assistance in obtaining follow-up care.

## 2016-09-29 ENCOUNTER — Ambulatory Visit (HOSPITAL_COMMUNITY): Payer: Medicare Other

## 2016-09-29 ENCOUNTER — Encounter (HOSPITAL_COMMUNITY): Payer: Self-pay

## 2016-09-29 ENCOUNTER — Ambulatory Visit (HOSPITAL_COMMUNITY): Payer: Medicare Other | Admitting: Anesthesiology

## 2016-09-29 ENCOUNTER — Encounter (HOSPITAL_COMMUNITY)
Admission: RE | Disposition: A | Discharge status: Home / Self Care | Payer: Self-pay | Source: Ambulatory Visit | Attending: General Surgery

## 2016-09-29 ENCOUNTER — Ambulatory Visit (HOSPITAL_COMMUNITY)
Admission: RE | Admit: 2016-09-29 | Discharge: 2016-09-29 | Disposition: A | Discharge status: Home / Self Care | Payer: Medicare Other | Source: Ambulatory Visit | Attending: General Surgery | Admitting: General Surgery

## 2016-09-29 DIAGNOSIS — Z79899 Other long term (current) drug therapy: Secondary | ICD-10-CM | POA: Insufficient documentation

## 2016-09-29 DIAGNOSIS — Z880 Allergy status to penicillin: Secondary | ICD-10-CM | POA: Insufficient documentation

## 2016-09-29 DIAGNOSIS — Z886 Allergy status to analgesic agent status: Secondary | ICD-10-CM | POA: Diagnosis not present

## 2016-09-29 DIAGNOSIS — G4730 Sleep apnea, unspecified: Secondary | ICD-10-CM | POA: Diagnosis not present

## 2016-09-29 DIAGNOSIS — Z452 Encounter for adjustment and management of vascular access device: Secondary | ICD-10-CM | POA: Diagnosis not present

## 2016-09-29 DIAGNOSIS — C50912 Malignant neoplasm of unspecified site of left female breast: Secondary | ICD-10-CM | POA: Diagnosis not present

## 2016-09-29 DIAGNOSIS — Z6841 Body Mass Index (BMI) 40.0 and over, adult: Secondary | ICD-10-CM | POA: Diagnosis not present

## 2016-09-29 DIAGNOSIS — Z825 Family history of asthma and other chronic lower respiratory diseases: Secondary | ICD-10-CM | POA: Insufficient documentation

## 2016-09-29 DIAGNOSIS — K219 Gastro-esophageal reflux disease without esophagitis: Secondary | ICD-10-CM | POA: Diagnosis not present

## 2016-09-29 DIAGNOSIS — Z7982 Long term (current) use of aspirin: Secondary | ICD-10-CM | POA: Insufficient documentation

## 2016-09-29 DIAGNOSIS — Z8249 Family history of ischemic heart disease and other diseases of the circulatory system: Secondary | ICD-10-CM | POA: Insufficient documentation

## 2016-09-29 DIAGNOSIS — Z91048 Other nonmedicinal substance allergy status: Secondary | ICD-10-CM | POA: Insufficient documentation

## 2016-09-29 DIAGNOSIS — E7800 Pure hypercholesterolemia, unspecified: Secondary | ICD-10-CM | POA: Insufficient documentation

## 2016-09-29 DIAGNOSIS — Z95828 Presence of other vascular implants and grafts: Secondary | ICD-10-CM

## 2016-09-29 DIAGNOSIS — C50919 Malignant neoplasm of unspecified site of unspecified female breast: Secondary | ICD-10-CM | POA: Diagnosis not present

## 2016-09-29 DIAGNOSIS — F419 Anxiety disorder, unspecified: Secondary | ICD-10-CM | POA: Insufficient documentation

## 2016-09-29 HISTORY — PX: PORTACATH PLACEMENT: SHX2246

## 2016-09-29 SURGERY — INSERTION, TUNNELED CENTRAL VENOUS DEVICE, WITH PORT
Anesthesia: Monitor Anesthesia Care | Site: Chest | Laterality: Right

## 2016-09-29 MED ORDER — LIDOCAINE HCL (PF) 1 % IJ SOLN
INTRAMUSCULAR | Status: AC
  Filled 2016-09-29: qty 30

## 2016-09-29 MED ORDER — VANCOMYCIN HCL 10 G IV SOLR
1500.0000 mg | INTRAVENOUS | Status: AC
  Administered 2016-09-29: 1500 mg via INTRAVENOUS
  Filled 2016-09-29: qty 500

## 2016-09-29 MED ORDER — HEPARIN SOD (PORK) LOCK FLUSH 100 UNIT/ML IV SOLN
INTRAVENOUS | Status: AC
  Filled 2016-09-29: qty 5

## 2016-09-29 MED ORDER — FENTANYL CITRATE (PF) 100 MCG/2ML IJ SOLN
25.0000 ug | INTRAMUSCULAR | Status: AC | PRN
  Administered 2016-09-29 (×2): 25 ug via INTRAVENOUS

## 2016-09-29 MED ORDER — LABETALOL HCL 5 MG/ML IV SOLN
INTRAVENOUS | Status: AC
  Filled 2016-09-29: qty 4

## 2016-09-29 MED ORDER — FENTANYL CITRATE (PF) 100 MCG/2ML IJ SOLN
INTRAMUSCULAR | Status: AC
  Filled 2016-09-29: qty 2

## 2016-09-29 MED ORDER — PROPOFOL 500 MG/50ML IV EMUL
INTRAVENOUS | Status: DC | PRN
  Administered 2016-09-29: 75 ug/kg/min via INTRAVENOUS

## 2016-09-29 MED ORDER — LIDOCAINE HCL (CARDIAC) 20 MG/ML IV SOLN
INTRAVENOUS | Status: DC | PRN
  Administered 2016-09-29: 50 mg via INTRATRACHEAL

## 2016-09-29 MED ORDER — SODIUM CHLORIDE 0.9 % IV SOLN
INTRAVENOUS | Status: DC | PRN
  Administered 2016-09-29: 500 mL via INTRAMUSCULAR

## 2016-09-29 MED ORDER — LACTATED RINGERS IV SOLN
INTRAVENOUS | Status: DC
  Administered 2016-09-29 (×2): via INTRAVENOUS

## 2016-09-29 MED ORDER — VANCOMYCIN HCL 10 G IV SOLR
INTRAVENOUS | Status: AC
  Filled 2016-09-29: qty 1500

## 2016-09-29 MED ORDER — MIDAZOLAM HCL 2 MG/2ML IJ SOLN
1.0000 mg | INTRAMUSCULAR | Status: DC | PRN
  Administered 2016-09-29 (×2): 2 mg via INTRAVENOUS
  Filled 2016-09-29: qty 2

## 2016-09-29 MED ORDER — LABETALOL HCL 5 MG/ML IV SOLN: 10.0000 mg | Freq: Once | INTRAVENOUS | Status: DC

## 2016-09-29 MED ORDER — FENTANYL CITRATE (PF) 100 MCG/2ML IJ SOLN
INTRAMUSCULAR | Status: DC | PRN
  Administered 2016-09-29 (×4): 25 ug via INTRAVENOUS

## 2016-09-29 MED ORDER — ONDANSETRON HCL 4 MG/2ML IJ SOLN
INTRAMUSCULAR | Status: AC
  Filled 2016-09-29: qty 2

## 2016-09-29 MED ORDER — KETOROLAC TROMETHAMINE 30 MG/ML IJ SOLN
30.0000 mg | Freq: Once | INTRAMUSCULAR | Status: AC
  Administered 2016-09-29: 30 mg via INTRAVENOUS
  Filled 2016-09-29: qty 1

## 2016-09-29 MED ORDER — HEPARIN SOD (PORK) LOCK FLUSH 100 UNIT/ML IV SOLN
INTRAVENOUS | Status: DC | PRN
  Administered 2016-09-29: 500 [IU] via INTRAVENOUS

## 2016-09-29 MED ORDER — ONDANSETRON HCL 4 MG/2ML IJ SOLN
4.0000 mg | Freq: Once | INTRAMUSCULAR | Status: AC
  Administered 2016-09-29: 4 mg via INTRAVENOUS

## 2016-09-29 MED ORDER — PROPOFOL 10 MG/ML IV BOLUS
INTRAVENOUS | Status: AC
  Filled 2016-09-29: qty 20

## 2016-09-29 MED ORDER — LIDOCAINE HCL (PF) 1 % IJ SOLN
INTRAMUSCULAR | Status: DC | PRN
  Administered 2016-09-29: 6 mL

## 2016-09-29 MED ORDER — CHLORHEXIDINE GLUCONATE CLOTH 2 % EX PADS
6.0000 | MEDICATED_PAD | Freq: Once | CUTANEOUS | Status: AC
  Administered 2016-09-29: 6 via TOPICAL

## 2016-09-29 MED ORDER — MIDAZOLAM HCL 2 MG/2ML IJ SOLN
INTRAMUSCULAR | Status: AC
  Filled 2016-09-29: qty 2

## 2016-09-29 SURGICAL SUPPLY — 40 items
ADH SKN CLS APL DERMABOND .7 (GAUZE/BANDAGES/DRESSINGS) ×1
APPLIER CLIP 9.375 SM OPEN (CLIP)
APR CLP SM 9.3 20 MLT OPN (CLIP)
BAG DECANTER FOR FLEXI CONT (MISCELLANEOUS) ×2 IMPLANT
BAG HAMPER (MISCELLANEOUS) ×2 IMPLANT
CATH HICKMAN DUAL 12.0 (CATHETERS) IMPLANT
CHLORAPREP W/TINT 10.5 ML (MISCELLANEOUS) ×2 IMPLANT
CLIP APPLIE 9.375 SM OPEN (CLIP) IMPLANT
CLOTH BEACON ORANGE TIMEOUT ST (SAFETY) ×2 IMPLANT
COVER LIGHT HANDLE STERIS (MISCELLANEOUS) ×4 IMPLANT
DECANTER SPIKE VIAL GLASS SM (MISCELLANEOUS) ×2 IMPLANT
DERMABOND ADVANCED (GAUZE/BANDAGES/DRESSINGS) ×1
DERMABOND ADVANCED .7 DNX12 (GAUZE/BANDAGES/DRESSINGS) ×1 IMPLANT
DRAPE C-ARM FOLDED MOBILE STRL (DRAPES) ×2 IMPLANT
ELECT REM PT RETURN 9FT ADLT (ELECTROSURGICAL) ×2
ELECTRODE REM PT RTRN 9FT ADLT (ELECTROSURGICAL) ×1 IMPLANT
GLOVE BIOGEL PI IND STRL 7.0 (GLOVE) ×1 IMPLANT
GLOVE BIOGEL PI INDICATOR 7.0 (GLOVE) ×1
GLOVE ECLIPSE 6.5 STRL STRAW (GLOVE) ×1 IMPLANT
GLOVE EXAM NITRILE MD LF STRL (GLOVE) ×1 IMPLANT
GLOVE SURG SS PI 7.5 STRL IVOR (GLOVE) ×2 IMPLANT
GOWN STRL REUS W/TWL LRG LVL3 (GOWN DISPOSABLE) ×4 IMPLANT
IV NS 500ML (IV SOLUTION) ×2
IV NS 500ML BAXH (IV SOLUTION) ×1 IMPLANT
KIT PORT POWER 8FR ISP MRI (Port) ×2 IMPLANT
KIT ROOM TURNOVER APOR (KITS) ×2 IMPLANT
MANIFOLD NEPTUNE II (INSTRUMENTS) ×2 IMPLANT
NDL HYPO 25X1 1.5 SAFETY (NEEDLE) ×1 IMPLANT
NEEDLE HYPO 25X1 1.5 SAFETY (NEEDLE) ×2 IMPLANT
PACK MINOR (CUSTOM PROCEDURE TRAY) ×2 IMPLANT
PAD ARMBOARD 7.5X6 YLW CONV (MISCELLANEOUS) ×2 IMPLANT
SET BASIN LINEN APH (SET/KITS/TRAYS/PACK) ×2 IMPLANT
SET INTRODUCER 12FR PACEMAKER (SHEATH) IMPLANT
SHEATH COOK PEEL AWAY SET 8F (SHEATH) IMPLANT
SUT PROLENE 3 0 PS 2 (SUTURE) IMPLANT
SUT VIC AB 3-0 SH 27 (SUTURE) ×2
SUT VIC AB 3-0 SH 27X BRD (SUTURE) ×1 IMPLANT
SUT VIC AB 4-0 PS2 27 (SUTURE) ×2 IMPLANT
SYR 20CC LL (SYRINGE) ×2 IMPLANT
SYR CONTROL 10ML LL (SYRINGE) ×2 IMPLANT

## 2016-09-29 NOTE — Op Note (Signed)
Patient:  Tracy Valentine  DOB:  1956/01/10  MRN:  [id number]   Preop Diagnosis:  Left breast carcinoma, need for central venous access  Postop Diagnosis:  Same  Procedure:  Port-A-Cath insertion  Surgeon:  Aviva Signs, M.D.  Anes:  Mac  Indications:  Patient is a 60 year old white female recently diagnosed with left breast carcinoma who now presents for a Port-A-Cath insertion. She is about to undergo chemotherapy. The risks and benefits of the procedure including bleeding, infection, and pneumothorax were fully explained to the patient, who gave informed consent.  Procedure note:  The patient was placed the supine position. After monitored anesthesia care was begun, the right upper chest was prepped and draped using usual sterile technique with DuraPrep. Surgical site confirmation was performed. 1% Xylocaine was used for local anesthesia.  An incision was made below the right clavicle. A subcutaneous pocket was formed. A needle was advanced into the right subclavian vein using the Seldinger technique without difficulty. A guidewire was then advanced into the right atrium under fluoroscopic guidance. An introducer peel-away sheath were placed over the guidewire. The catheter was then inserted through the peel-away sheath and the peel-away sheath was removed. The catheter was then attached to the port and the port placed in subcutaneous pocket. Adequate positioning was confirmed by fluoroscopy. Good backflow blood was noted on aspiration of the port. The port was flushed with heparin flush. A power port was inserted. The subcutaneous layer was reapproximated using 3-0 Vicryl interrupted suture. The skin was closed using a 4-0 Vicryl subcuticular suture. Dermabond was applied.  All tape and needle counts were correct at the end of the procedure. Patient was awakened and transferred to PACU in stable condition. A chest x-ray will be performed at that time.  Complications:  None  EBL:   Minimal  Specimen:  None

## 2016-09-29 NOTE — Anesthesia Preprocedure Evaluation (Addendum)
Anesthesia Evaluation  Patient identified by MRN, date of birth, ID band Patient awake    Reviewed: Allergy & Precautions, NPO status , Patient's Chart, lab work & pertinent test results  History of Anesthesia Complications (+) PONV and history of anesthetic complications  Airway Mallampati: II  TM Distance: <3 FB Neck ROM: Full    Dental  (+) Teeth Intact, Dental Advisory Given   Pulmonary sleep apnea and Continuous Positive Airway Pressure Ventilation ,    Pulmonary exam normal breath sounds clear to auscultation       Cardiovascular Exercise Tolerance: Good hypertension, negative cardio ROS Normal cardiovascular exam Rhythm:Regular Rate:Normal     Neuro/Psych PSYCHIATRIC DISORDERS Anxiety Depression Spinal stenosis     GI/Hepatic Neg liver ROS, GERD  Medicated,  Endo/Other  Hypothyroidism Morbid obesity  Renal/GU negative Renal ROS     Musculoskeletal  (+) Arthritis ,   Abdominal   Peds  Hematology negative hematology ROS (+)   Anesthesia Other Findings   Reproductive/Obstetrics                            Anesthesia Physical Anesthesia Plan  ASA: III  Anesthesia Plan: MAC   Post-op Pain Management:    Induction: Intravenous  Airway Management Planned: Simple Face Mask  Additional Equipment:   Intra-op Plan:   Post-operative Plan:   Informed Consent: I have reviewed the patients History and Physical, chart, labs and discussed the procedure including the risks, benefits and alternatives for the proposed anesthesia with the patient or authorized representative who has indicated his/her understanding and acceptance.     Plan Discussed with:   Anesthesia Plan Comments:         Anesthesia Quick Evaluation

## 2016-09-29 NOTE — Anesthesia Postprocedure Evaluation (Signed)
Anesthesia Post Note  Patient: Tracy Valentine  Procedure(s) Performed: Procedure(s) (LRB): INSERTION PORT-A-CATH RIGHT SUBCLAVIAN (Right)  Patient location during evaluation: PACU Anesthesia Type: MAC Level of consciousness: awake and alert, oriented and patient cooperative Pain management: satisfactory to patient Vital Signs Assessment: post-procedure vital signs reviewed and stable Respiratory status: spontaneous breathing Cardiovascular status: blood pressure returned to baseline and stable Postop Assessment: no headache, no backache, no signs of nausea or vomiting and adequate PO intake    Last Vitals:  Vitals:   09/29/16 0845 09/29/16 0932  BP: 139/71 126/71  Pulse:  62  Resp: (!) 27   Temp:      Last Pain:  Vitals:   09/29/16 0749  TempSrc: Oral  PainSc: 3                  Ferne [place]ing[place]

## 2016-09-29 NOTE — Transfer of Care (Signed)
Immediate Anesthesia Transfer of Care Note  Patient: Tracy Valentine  Procedure(s) Performed: Procedure(s): INSERTION PORT-A-CATH RIGHT SUBCLAVIAN (Right)  Patient Location: PACU  Anesthesia Type:MAC  Level of Consciousness: awake, alert , oriented and patient cooperative  Airway & Oxygen Therapy: Patient Spontanous Breathing and Patient connected to nasal cannula oxygen  Post-op Assessment: Report given to RN and Post -op Vital signs reviewed and stable  Post vital signs: Reviewed and stable  Last Vitals:  Vitals:   09/29/16 0840 09/29/16 0845  BP: 129/68 139/71  Pulse:    Resp: 20 (!) 27  Temp:      Last Pain:  Vitals:   09/29/16 0749  TempSrc: Oral  PainSc: 3       Patients Stated Pain Goal: 6 ([id number])  Complications: No apparent anesthesia complications

## 2016-09-29 NOTE — Discharge Instructions (Signed)
Implanted Port Insertion, Care After °Refer to this sheet in the next few weeks. These instructions provide you with information on caring for yourself after your procedure. Your health care provider may also give you more specific instructions. Your treatment has been planned according to current medical practices, but problems sometimes occur. Call your health care provider if you have any problems or questions after your procedure. °WHAT TO EXPECT AFTER THE PROCEDURE °After your procedure, it is typical to have the following:  °· Discomfort at the port insertion site. Ice packs to the area will help. °· Bruising on the skin over the port. This will subside in 3-4 days. °HOME CARE INSTRUCTIONS °· After your port is placed, you will get a manufacturer's information card. The card has information about your port. Keep this card with you at all times.   °· Know what kind of port you have. There are many types of ports available.   °· Wear a medical alert bracelet in case of an emergency. This can help alert health care workers that you have a port.   °· The port can stay in for as long as your health care provider believes it is necessary.   °· A home health care nurse may give medicines and take care of the port.   °· You or a family member can get special training and directions for giving medicine and taking care of the port at home.   °SEEK MEDICAL CARE IF:  °· Your port does not flush or you are unable to get a blood return.   °· You have a fever or chills. °SEEK IMMEDIATE MEDICAL CARE IF: °· You have new fluid or pus coming from your incision.   °· You notice a bad smell coming from your incision site.   °· You have swelling, pain, or more redness at the incision or port site.   °· You have chest pain or shortness of breath. °This information is not intended to replace advice given to you by your health care provider. Make sure you discuss any questions you have with your health care provider. °Document  Released: 07/30/2013 Document Revised: 10/14/2013 Document Reviewed: 07/30/2013 °Elsevier Interactive Patient Education © 2017 [employer] °Implanted Port Home Guide °An implanted port is a type of central line that is placed under the skin. Central lines are used to provide IV access when treatment or nutrition needs to be given through a person's veins. Implanted ports are used for long-term IV access. An implanted port may be placed because:  °· You need IV medicine that would be irritating to the small veins in your hands or arms.   °· You need long-term IV medicines, such as antibiotics.   °· You need IV nutrition for a long period.   °· You need frequent blood draws for lab tests.   °· You need dialysis.   °Implanted ports are usually placed in the chest area, but they can also be placed in the upper arm, the abdomen, or the leg. An implanted port has two main parts:  °· Reservoir. The reservoir is round and will appear as a small, raised area under your skin. The reservoir is the part where a needle is inserted to give medicines or draw blood.   °· Catheter. The catheter is a thin, flexible tube that extends from the reservoir. The catheter is placed into a large vein. Medicine that is inserted into the reservoir goes into the catheter and then into the vein.   °HOW WILL I CARE FOR MY INCISION SITE? °Do not get the incision site wet.   Bathe or shower as directed by your health care provider.  °HOW IS MY PORT ACCESSED? °Special steps must be taken to access the port:  °· Before the port is accessed, a numbing cream can be placed on the skin. This helps numb the skin over the port site.   °· Your health care provider uses a sterile technique to access the port. °¨ Your health care provider must put on a mask and sterile gloves. °¨ The skin over your port is cleaned carefully with an antiseptic and allowed to dry. °¨ The port is gently pinched between sterile gloves, and a needle is inserted into the  port. °· Only "non-coring" port needles should be used to access the port. Once the port is accessed, a blood return should be checked. This helps ensure that the port is in the vein and is not clogged.   °· If your port needs to remain accessed for a constant infusion, a clear (transparent) bandage will be placed over the needle site. The bandage and needle will need to be changed every week, or as directed by your health care provider.   °· Keep the bandage covering the needle clean and dry. Do not get it wet. Follow your health care provider's instructions on how to take a shower or bath while the port is accessed.   °· If your port does not need to stay accessed, no bandage is needed over the port.   °WHAT IS FLUSHING? °Flushing helps keep the port from getting clogged. Follow your health care provider's instructions on how and when to flush the port. Ports are usually flushed with saline solution or a medicine called heparin. The need for flushing will depend on how the port is used.  °· If the port is used for intermittent medicines or blood draws, the port will need to be flushed:   °¨ After medicines have been given.   °¨ After blood has been drawn.   °¨ As part of routine maintenance.   °· If a constant infusion is running, the port may not need to be flushed.   °HOW LONG WILL MY PORT STAY IMPLANTED? °The port can stay in for as long as your health care provider thinks it is needed. When it is time for the port to come out, surgery will be done to remove it. The procedure is similar to the one performed when the port was put in.  °WHEN SHOULD I SEEK IMMEDIATE MEDICAL CARE? °When you have an implanted port, you should seek immediate medical care if:  °· You notice a bad smell coming from the incision site.   °· You have swelling, redness, or drainage at the incision site.   °· You have more swelling or pain at the port site or the surrounding area.   °· You have a fever that is not controlled with  medicine. °This information is not intended to replace advice given to you by your health care provider. Make sure you discuss any questions you have with your health care provider. °Document Released: 10/09/2005 Document Revised: 07/30/2013 Document Reviewed: 06/16/2013 °Elsevier Interactive Patient Education © 2017 Elsevier Inc. ° °

## 2016-09-29 NOTE — Interval H&P Note (Signed)
History and Physical Interval Note:  09/29/2016 8:07 AM  Tracy Valentine  has presented today for surgery, with the diagnosis of left breast cancer  The various methods of treatment have been discussed with the patient and family. After consideration of risks, benefits and other options for treatment, the patient has consented to  Procedure(s): INSERTION PORT-A-CATH (Right) as a surgical intervention .  The patient's history has been reviewed, patient examined, no change in status, stable for surgery.  I have reviewed the patient's chart and labs.  Questions were answered to the patient's satisfaction.     Aviva Signs A

## 2016-10-02 ENCOUNTER — Encounter (HOSPITAL_COMMUNITY): Payer: Self-pay | Admitting: General Surgery

## 2016-10-03 ENCOUNTER — Encounter (HOSPITAL_COMMUNITY): Payer: Self-pay

## 2016-10-03 ENCOUNTER — Encounter: Payer: Self-pay | Admitting: *Deleted

## 2016-10-03 ENCOUNTER — Encounter (HOSPITAL_BASED_OUTPATIENT_CLINIC_OR_DEPARTMENT_OTHER): Payer: Medicare Other

## 2016-10-03 VITALS — BP 143/56 | HR 66 | Temp 98.2°F | Resp 16 | Wt 250.8 lb

## 2016-10-03 DIAGNOSIS — R300 Dysuria: Secondary | ICD-10-CM | POA: Diagnosis not present

## 2016-10-03 DIAGNOSIS — B370 Candidal stomatitis: Secondary | ICD-10-CM | POA: Diagnosis not present

## 2016-10-03 DIAGNOSIS — C50412 Malignant neoplasm of upper-outer quadrant of left female breast: Secondary | ICD-10-CM | POA: Diagnosis not present

## 2016-10-03 DIAGNOSIS — Z5111 Encounter for antineoplastic chemotherapy: Secondary | ICD-10-CM

## 2016-10-03 DIAGNOSIS — Z5189 Encounter for other specified aftercare: Secondary | ICD-10-CM | POA: Diagnosis not present

## 2016-10-03 DIAGNOSIS — E876 Hypokalemia: Secondary | ICD-10-CM | POA: Diagnosis not present

## 2016-10-03 DIAGNOSIS — Z170 Estrogen receptor positive status [ER+]: Principal | ICD-10-CM

## 2016-10-03 DIAGNOSIS — L089 Local infection of the skin and subcutaneous tissue, unspecified: Secondary | ICD-10-CM | POA: Diagnosis not present

## 2016-10-03 DIAGNOSIS — R110 Nausea: Secondary | ICD-10-CM | POA: Diagnosis not present

## 2016-10-03 LAB — COMPREHENSIVE METABOLIC PANEL
ALK PHOS: 83 U/L (ref 38–126)
ALT: 20 U/L (ref 14–54)
AST: 20 U/L (ref 15–41)
Albumin: 3.9 g/dL (ref 3.5–5.0)
Anion gap: 6 (ref 5–15)
BILIRUBIN TOTAL: 0.5 mg/dL (ref 0.3–1.2)
BUN: 17 mg/dL (ref 6–20)
CO2: 26 mmol/L (ref 22–32)
CREATININE: 0.79 mg/dL (ref 0.44–1.00)
Calcium: 10.2 mg/dL (ref 8.9–10.3)
Chloride: 103 mmol/L (ref 101–111)
Glucose, Bld: 116 mg/dL — ABNORMAL HIGH (ref 65–99)
Potassium: 3.9 mmol/L (ref 3.5–5.1)
Sodium: 135 mmol/L (ref 135–145)
Total Protein: 6.9 g/dL (ref 6.5–8.1)

## 2016-10-03 LAB — CBC WITH DIFFERENTIAL/PLATELET
Basophils Absolute: 0 10*3/uL (ref 0.0–0.1)
Basophils Relative: 0 %
EOS PCT: 0 %
Eosinophils Absolute: 0 10*3/uL (ref 0.0–0.7)
HEMATOCRIT: 39.3 % (ref 36.0–46.0)
Hemoglobin: 12.9 g/dL (ref 12.0–15.0)
LYMPHS ABS: 1 10*3/uL (ref 0.7–4.0)
LYMPHS PCT: 8 %
MCH: 32.4 pg (ref 26.0–34.0)
MCHC: 32.8 g/dL (ref 30.0–36.0)
MCV: 98.7 fL (ref 78.0–100.0)
MONO ABS: 0.5 10*3/uL (ref 0.1–1.0)
Monocytes Relative: 4 %
NEUTROS ABS: 10.1 10*3/uL — AB (ref 1.7–7.7)
Neutrophils Relative %: 88 %
PLATELETS: 233 10*3/uL (ref 150–400)
RBC: 3.98 MIL/uL (ref 3.87–5.11)
RDW: 12.6 % (ref 11.5–15.5)
WBC: 11.5 10*3/uL — ABNORMAL HIGH (ref 4.0–10.5)

## 2016-10-03 MED ORDER — DOCETAXEL CHEMO INJECTION 160 MG/16ML
75.0000 mg/m2 | Freq: Once | INTRAVENOUS | Status: AC
  Administered 2016-10-03: 170 mg via INTRAVENOUS
  Filled 2016-10-03: qty 17

## 2016-10-03 MED ORDER — SODIUM CHLORIDE 0.9 % IV SOLN
600.0000 mg/m2 | Freq: Once | INTRAVENOUS | Status: AC
  Administered 2016-10-03: 1380 mg via INTRAVENOUS
  Filled 2016-10-03: qty 19

## 2016-10-03 MED ORDER — HEPARIN SOD (PORK) LOCK FLUSH 100 UNIT/ML IV SOLN
500.0000 [IU] | Freq: Once | INTRAVENOUS | Status: AC | PRN
  Administered 2016-10-03: 500 [IU]

## 2016-10-03 MED ORDER — DEXAMETHASONE SODIUM PHOSPHATE 100 MG/10ML IJ SOLN: 10.0000 mg | Freq: Once | INTRAMUSCULAR | Status: DC

## 2016-10-03 MED ORDER — ACETAMINOPHEN 325 MG PO TABS
ORAL_TABLET | ORAL | Status: AC
  Filled 2016-10-03: qty 2

## 2016-10-03 MED ORDER — PEGFILGRASTIM 6 MG/0.6ML ~~LOC~~ PSKT
6.0000 mg | PREFILLED_SYRINGE | Freq: Once | SUBCUTANEOUS | Status: AC
  Administered 2016-10-03: 6 mg via SUBCUTANEOUS

## 2016-10-03 MED ORDER — PALONOSETRON HCL INJECTION 0.25 MG/5ML
0.2500 mg | Freq: Once | INTRAVENOUS | Status: AC
  Administered 2016-10-03: 0.25 mg via INTRAVENOUS
  Filled 2016-10-03: qty 5

## 2016-10-03 MED ORDER — SODIUM CHLORIDE 0.9% FLUSH
10.0000 mL | INTRAVENOUS | Status: DC | PRN
  Administered 2016-10-03: 10 mL
  Filled 2016-10-03: qty 10

## 2016-10-03 MED ORDER — ACETAMINOPHEN 325 MG PO TABS
650.0000 mg | ORAL_TABLET | Freq: Once | ORAL | Status: AC
  Administered 2016-10-03: 650 mg via ORAL

## 2016-10-03 MED ORDER — FAMOTIDINE IN NACL 20-0.9 MG/50ML-% IV SOLN
20.0000 mg | Freq: Once | INTRAVENOUS | Status: AC | PRN
  Administered 2016-10-03: 20 mg via INTRAVENOUS

## 2016-10-03 MED ORDER — SODIUM CHLORIDE 0.9 % IV SOLN
Freq: Once | INTRAVENOUS | Status: AC
  Administered 2016-10-03: 10:00:00 via INTRAVENOUS

## 2016-10-03 MED ORDER — PEGFILGRASTIM 6 MG/0.6ML ~~LOC~~ PSKT
PREFILLED_SYRINGE | SUBCUTANEOUS | Status: AC
  Filled 2016-10-03: qty 0.6

## 2016-10-03 MED ORDER — DEXAMETHASONE SODIUM PHOSPHATE 10 MG/ML IJ SOLN
10.0000 mg | Freq: Once | INTRAMUSCULAR | Status: AC
  Administered 2016-10-03: 10 mg via INTRAVENOUS
  Filled 2016-10-03: qty 1

## 2016-10-03 MED ORDER — DIPHENHYDRAMINE HCL 50 MG/ML IJ SOLN
50.0000 mg | Freq: Once | INTRAMUSCULAR | Status: AC | PRN
  Administered 2016-10-03: 50 mg via INTRAVENOUS

## 2016-10-03 NOTE — Progress Notes (Signed)
1125-Patient complained of feeling nauseated, flushed feeling. Taxotere stopped. Normal saline running. Vitals stable, see flowsheet. Benadry 50mg  given IV and pepcid given per Kirby Crigler PA-C. Vitals still stable, patient states nausea is better.   1150-Rechecked vitals, they are stable. Will restart taxotere per protocol. See MAR for details.  1220-patient got flushed feeling again. BP elevated, Temp 99.1. Tylenol given per Manning Charity. MD notified. Vitals stable , will continue at same rate for another 55min per Dr.Penland.   1225-BP 155/76  Patient completed chemotherapy today. Patient instructed on Neulasta onpro device. Friend at bedside to hear instructions as well . Medication will be depensed between 6pm-645om on the 12th. Patient understood. Patient discharged in stable condition. Follow up as scheduled.

## 2016-10-03 NOTE — Progress Notes (Signed)
[place] Clinical Social Work  Clinical Social Work was referred by nurse for assessment of psychosocial needs due to possible financial needs. Clinical Social Worker met with patient at [facility] during treatment to offer support and assess for needs. CSW introduced self and explained role of CSW. Pt shared her medical bills have begun to get overwhelming and she is having difficulty making ends meet with the added financial stress of cancer treatment.  CSW reviewed possible assistance organizations for breast cancer patients. Pt appears to meet the income guidelines for [employer], [employer] and [employer]. CSW reviewed application process for each assistance organization. CSW provided pt with application for [employer] that helps cover out of pocket medical expenses for low income breast cancer patients. Pt plans to review and gather needed documents to apply. CSW also reviewed common emotions, coping techniques and support programs to assist patient. CSW will follow and assist accordingly.     Clinical Social Work interventions: Resource education and referral Supportive listening [place], [place] Tuesdays   [phone]

## 2016-10-03 NOTE — Patient Instructions (Signed)
[id number] [facility] Discharge Instructions for Patients Receiving Chemotherapy   Beginning January 23rd 2017 lab work for the [facility] will be done in the  Main lab at [facility] on 1st floor. If you have a lab appointment with the [facility] please come in thru the  Main Entrance and check in at the main information desk   Today you received the following chemotherapy agents Taxotere, cyclophosphamide  To help prevent nausea and vomiting after your treatment, we encourage you to take your nausea medication     If you develop nausea and vomiting, or diarrhea that is not controlled by your medication, call the clinic.  The clinic phone number is [phone]. Office hours are Monday-Friday 8:30am-5:00pm.  BELOW ARE SYMPTOMS THAT SHOULD BE REPORTED IMMEDIATELY:  *FEVER GREATER THAN 101.0 F  *CHILLS WITH OR WITHOUT FEVER  NAUSEA AND VOMITING THAT IS NOT CONTROLLED WITH YOUR NAUSEA MEDICATION  *UNUSUAL SHORTNESS OF BREATH  *UNUSUAL BRUISING OR BLEEDING  TENDERNESS IN MOUTH AND THROAT WITH OR WITHOUT PRESENCE OF ULCERS  *URINARY PROBLEMS  *BOWEL PROBLEMS  UNUSUAL RASH Items with * indicate a potential emergency and should be followed up as soon as possible. If you have an emergency after office hours please contact your primary care physician or go to the nearest emergency department.  Please call the clinic during office hours if you have any questions or concerns.   You may also contact the Patient Navigator at [phone] should you have any questions or need assistance in obtaining follow up care.      Resources For Cancer Patients and their Caregivers ? American Cancer Society: Can assist with transportation, wigs, general needs, runs Look Good Feel Better.        ([phone] ? Cancer Care: Provides financial assistance, online support groups, medication/co-pay assistance.  1-800-813-HOPE 940-645-9464) ? [place] Assists  [place] cancer patients and their families through emotional , educational and financial support.  2723684032 ? [place] DSS Where to apply for food stamps, Medicaid and utility assistance. 713-515-0664 ? RCATS: Transportation to medical appointments. 870-615-0332 ? Social Security Administration: May apply for disability if have a Stage IV cancer. 312-664-1031 [phone] ? [employer], Disability and Transit Services: Assists with nutrition, care and transit needs. 504-789-1928

## 2016-10-04 ENCOUNTER — Telehealth (HOSPITAL_COMMUNITY): Payer: Self-pay | Admitting: *Deleted

## 2016-10-04 NOTE — Telephone Encounter (Signed)
Spoke with pt re:  24 hour f/u post first chemo tx yesterday.  Reports feeling "a little bit queasy", but states this is controlled with her medications.   Denies any other s/s.  Instructed to call the clinic should she have any questions or concerns.

## 2016-10-09 ENCOUNTER — Encounter (HOSPITAL_BASED_OUTPATIENT_CLINIC_OR_DEPARTMENT_OTHER): Payer: Medicare Other | Admitting: Oncology

## 2016-10-09 ENCOUNTER — Telehealth (HOSPITAL_COMMUNITY): Payer: Self-pay | Admitting: *Deleted

## 2016-10-09 VITALS — BP 157/68 | HR 116 | Temp 99.3°F | Resp 20 | Wt 248.8 lb

## 2016-10-09 DIAGNOSIS — R110 Nausea: Secondary | ICD-10-CM | POA: Diagnosis not present

## 2016-10-09 DIAGNOSIS — R300 Dysuria: Secondary | ICD-10-CM | POA: Diagnosis not present

## 2016-10-09 DIAGNOSIS — B370 Candidal stomatitis: Secondary | ICD-10-CM | POA: Diagnosis not present

## 2016-10-09 DIAGNOSIS — E876 Hypokalemia: Secondary | ICD-10-CM | POA: Diagnosis not present

## 2016-10-09 DIAGNOSIS — C50412 Malignant neoplasm of upper-outer quadrant of left female breast: Secondary | ICD-10-CM | POA: Diagnosis not present

## 2016-10-09 DIAGNOSIS — L089 Local infection of the skin and subcutaneous tissue, unspecified: Secondary | ICD-10-CM | POA: Insufficient documentation

## 2016-10-09 DIAGNOSIS — Z170 Estrogen receptor positive status [ER+]: Principal | ICD-10-CM

## 2016-10-09 LAB — URINALYSIS, ROUTINE W REFLEX MICROSCOPIC
Bilirubin Urine: NEGATIVE
Glucose, UA: NEGATIVE mg/dL
KETONES UR: NEGATIVE mg/dL
Nitrite: NEGATIVE
PH: 5 (ref 5.0–8.0)
PROTEIN: 30 mg/dL — AB
Specific Gravity, Urine: 1.015 (ref 1.005–1.030)
WBC, UA: NONE SEEN WBC/hpf (ref 0–5)

## 2016-10-09 LAB — COMPREHENSIVE METABOLIC PANEL
ALK PHOS: 98 U/L (ref 38–126)
ALT: 23 U/L (ref 14–54)
AST: 25 U/L (ref 15–41)
Albumin: 3.7 g/dL (ref 3.5–5.0)
Anion gap: 8 (ref 5–15)
BUN: 5 mg/dL — AB (ref 6–20)
CALCIUM: 9.9 mg/dL (ref 8.9–10.3)
CO2: 25 mmol/L (ref 22–32)
CREATININE: 0.79 mg/dL (ref 0.44–1.00)
Chloride: 97 mmol/L — ABNORMAL LOW (ref 101–111)
Glucose, Bld: 139 mg/dL — ABNORMAL HIGH (ref 65–99)
Potassium: 2.8 mmol/L — ABNORMAL LOW (ref 3.5–5.1)
SODIUM: 130 mmol/L — AB (ref 135–145)
Total Bilirubin: 0.4 mg/dL (ref 0.3–1.2)
Total Protein: 6.7 g/dL (ref 6.5–8.1)

## 2016-10-09 LAB — CBC WITH DIFFERENTIAL/PLATELET
BASOS ABS: 0.1 10*3/uL (ref 0.0–0.1)
BASOS PCT: 2 %
Eosinophils Absolute: 0.1 10*3/uL (ref 0.0–0.7)
Eosinophils Relative: 2 %
HEMATOCRIT: 40.5 % (ref 36.0–46.0)
HEMOGLOBIN: 13.6 g/dL (ref 12.0–15.0)
Lymphocytes Relative: 43 %
Lymphs Abs: 1.4 10*3/uL (ref 0.7–4.0)
MCH: 32.6 pg (ref 26.0–34.0)
MCHC: 33.6 g/dL (ref 30.0–36.0)
MCV: 97.1 fL (ref 78.0–100.0)
MONOS PCT: 15 %
Monocytes Absolute: 0.5 10*3/uL (ref 0.1–1.0)
NEUTROS ABS: 1.2 10*3/uL — AB (ref 1.7–7.7)
NEUTROS PCT: 39 %
Platelets: 129 10*3/uL — ABNORMAL LOW (ref 150–400)
RBC: 4.17 MIL/uL (ref 3.87–5.11)
RDW: 12.5 % (ref 11.5–15.5)
WBC: 3.2 10*3/uL — AB (ref 4.0–10.5)

## 2016-10-09 MED ORDER — FLUCONAZOLE 100 MG PO TABS: 100.0000 mg | ORAL_TABLET | Freq: Every day | ORAL | 1 refills | Status: DC

## 2016-10-09 MED ORDER — POTASSIUM CHLORIDE CRYS ER 20 MEQ PO TBCR: 40.0000 meq | EXTENDED_RELEASE_TABLET | Freq: Every day | ORAL | 0 refills | Status: DC

## 2016-10-09 MED ORDER — SULFAMETHOXAZOLE-TRIMETHOPRIM 800-160 MG PO TABS: 1.0000 | ORAL_TABLET | Freq: Two times a day (BID) | ORAL | 0 refills | Status: DC

## 2016-10-09 MED ORDER — POTASSIUM CHLORIDE CRYS ER 20 MEQ PO TBCR
80.0000 meq | EXTENDED_RELEASE_TABLET | Freq: Once | ORAL | Status: AC
  Administered 2016-10-09: 80 meq via ORAL
  Filled 2016-10-09: qty 4

## 2016-10-09 MED ORDER — POTASSIUM CHLORIDE CRYS ER 20 MEQ PO TBCR: 80.0000 meq | EXTENDED_RELEASE_TABLET | Freq: Two times a day (BID) | ORAL | Status: DC

## 2016-10-09 MED ORDER — HEPARIN SOD (PORK) LOCK FLUSH 100 UNIT/ML IV SOLN
INTRAVENOUS | Status: AC
  Filled 2016-10-09: qty 5

## 2016-10-09 MED ORDER — CEPHALEXIN 500 MG PO CAPS: 500.0000 mg | ORAL_CAPSULE | Freq: Three times a day (TID) | ORAL | 0 refills | Status: DC

## 2016-10-09 MED ORDER — PALONOSETRON HCL INJECTION 0.25 MG/5ML
0.2500 mg | Freq: Once | INTRAVENOUS | Status: AC
  Administered 2016-10-09: 0.25 mg via INTRAVENOUS
  Filled 2016-10-09: qty 5

## 2016-10-09 MED ORDER — LORAZEPAM 2 MG/ML IJ SOLN
0.5000 mg | Freq: Once | INTRAMUSCULAR | Status: AC
  Administered 2016-10-09: 0.5 mg via INTRAVENOUS
  Filled 2016-10-09: qty 1

## 2016-10-09 MED ORDER — HEPARIN SOD (PORK) LOCK FLUSH 100 UNIT/ML IV SOLN
500.0000 [IU] | Freq: Once | INTRAVENOUS | Status: AC
  Administered 2016-10-09: 500 [IU] via INTRAVENOUS

## 2016-10-09 MED ORDER — FIRST-DUKES MOUTHWASH MT SUSP: 5.0000 mL | Freq: Four times a day (QID) | OROMUCOSAL | 1 refills | Status: DC | PRN

## 2016-10-09 MED ORDER — SODIUM CHLORIDE 0.9 % IV SOLN
1000.0000 mL | INTRAVENOUS | Status: DC
  Administered 2016-10-09: 1000 mL via INTRAVENOUS

## 2016-10-09 NOTE — Progress Notes (Addendum)
Tracy Valentine is here as a work-in.  She did not tolerate her first cycle of chemotherapy.  She reports that it was complicated by nausea WITHOUT vomiting, vaginal yeast infection (unresponsive to monostat), mouth sores, hematuria, diarrhea, and left axillary sore that opened last night and drained.  She reports that her nausea medications are effective, but she is taking them scheduled.  She notes that she has not vomited.  She reports mouth sores and grittiness in her oral cavity.    She notes a left axillary sore that is painful and opened yesterday and drained.    She has used Pepto-Bismol for her diarrhea.  She will be educated on anti-diarrheal management.  She also reports gross hematuria.    BP 118/82 (Patient Position: Standing)   Pulse (!) 116   Temp 99.3 F (37.4 C) (Oral)   Resp 20   Wt 248 lb 12.8 oz (112.9 kg)   BMI 40.16 kg/m  Gen: NAD, frustrated HEENT: Atraumatic, normocephalic. Skin: Warm and dry.  POSITIVE: left anteromedial lesion measuring 0.5 cm with surrounding erythema that is hot to the touch, tender to palpation, and with induration. Oropharynx: Small white lesions in buccal mucosa and hard palate.  Nursing performed orthostatic BP:  Sitting: 152/77  Standing: 118/82  Assessment: 1. Dehydration 2. Left arm abscess 3. Chemotherapy-induced diarrhea 4. Oral thrush 5. Chemotherapy-induced nausea WITHOUT vomiting 6. Gross hematuria 7. Hypokalemia, secondary to diarrhea.  Plan: 1. Rx for Diflucan 100 mg 2. Rx for Dukes Mouthwash 3. I could not express fluid from abscess for culture. 4. Rx for Bactrim DS for abscess 5. Patient education provided regarding diarrhea and proper use of Imodium 6. Continue with home antiemetics.  Future change may be needed. 7. UA with reflex and urine culture ordered. 8. IV fluids today, NS 1 L over 2 hours.  IV Aloxi and Ativan ordered. 9. Kdur 80 mEq today in the clinic.  Rx for Kdur 40 mEq daily escribed to her pharmacy 10.  Return as scheduled later this week for NADIR check.  Patient and plan discussed with Dr. Ancil Linsey and she is in agreement with the aforementioned.   KEFALAS,THOMAS, PA-C 3:56 PM 10/09/2016

## 2016-10-09 NOTE — Addendum Note (Signed)
Addended by: Donnie Aho on: 10/09/2016 04:47 PM   Modules accepted: Orders

## 2016-10-09 NOTE — Progress Notes (Signed)
Labs reviewed with Kirby Crigler PA-C.  Will order potassium as prescribed.

## 2016-10-09 NOTE — Addendum Note (Signed)
Addendum  created 10/09/16 1209 by Catalina Gravel, MD   Anesthesia Intra Blocks edited, Sign clinical note

## 2016-10-09 NOTE — Addendum Note (Signed)
Addended by: Donnie Aho on: 10/09/2016 04:06 PM   Modules accepted: Orders

## 2016-10-09 NOTE — Patient Instructions (Signed)
[facility] at [facility] Discharge Instructions  RECOMMENDATIONS MADE BY THE CONSULTANT AND ANY TEST RESULTS WILL BE SENT TO YOUR REFERRING PHYSICIAN.    Thank you for choosing [facility] at [facility] to provide your oncology and hematology care.  To afford each patient quality time with our provider, please arrive at least 15 minutes before your scheduled appointment time.   Beginning January 23rd 2017 lab work for the Cancer Center will be done in the  Main lab at [facility] on 1st floor. If you have a lab appointment with the Cancer Center please come in thru the  Main Entrance and check in at the main information desk  You need to re-schedule your appointment should you arrive 10 or more minutes late.  We strive to give you quality time with our providers, and arriving late affects you and other patients whose appointments are after yours.  Also, if you no show three or more times for appointments you may be dismissed from the clinic at the providers discretion.     Again, thank you for choosing [facility].  Our hope is that these requests will decrease the amount of time that you wait before being seen by our physicians.       _____________________________________________________________  Should you have questions after your visit to  Cancer Center, please contact our office at [phone] between the hours of 8:30 a.m. and 4:30 p.m.  Voicemails left after 4:30 p.m. will not be returned until the following business day.  For prescription refill requests, have your pharmacy contact our office.         Resources For Cancer Patients and their Caregivers ? American Cancer Society: Can assist with transportation, wigs, general needs, runs Look Good Feel Better.        1-888-227-6333 ? Cancer Care: Provides financial assistance, online support groups, medication/co-pay assistance.  1-800-813-HOPE (4673) ? [facility] Assists [place] cancer patients and their families through emotional , educational and financial support.  336-427-4357 ? [place] DSS Where to apply for food stamps, Medicaid and utility assistance. 336-342-1394 ? RCATS: Transportation to medical appointments. 336-347-2287 ? Social Security Administration: May apply for disability if have a Stage IV cancer. 336-342-7796 1-800-772-1213 ? [place] Aging, Disability and Transit Services: Assists with nutrition, care and transit needs. 336-349-2343  Cancer Center Support Programs: @10RELATIVEDAYS@ > Cancer Support Group  2nd Tuesday of the month 1pm-2pm, Journey Room  > Creative Journey  3rd Tuesday of the month 1130am-1pm, Journey Room  > Look Good Feel Better  1st Wednesday of the month 10am-12 noon, Journey Room (Call American Cancer Society to register 1-800-395-5775)   

## 2016-10-09 NOTE — Addendum Note (Signed)
Addended by: Baird [facility] on: 10/09/2016 04:04 PM   Modules accepted: Orders

## 2016-10-09 NOTE — Telephone Encounter (Signed)
Pt called after hours nurse on the 15th complaining of blood in her urine. Pt was advised to follow up with [place] within 24 hours. I gave the call paper to [facility] and he advised that the pt would need to come by the clinic and drop off a urine specimen.  I advised the pt of the above. Pt was seen by PA.

## 2016-10-11 ENCOUNTER — Ambulatory Visit (HOSPITAL_COMMUNITY): Payer: Medicare Other | Admitting: Oncology

## 2016-10-11 ENCOUNTER — Ambulatory Visit (HOSPITAL_COMMUNITY): Payer: Medicare Other

## 2016-10-11 ENCOUNTER — Encounter (HOSPITAL_COMMUNITY): Payer: Medicare Other

## 2016-10-11 ENCOUNTER — Encounter (HOSPITAL_BASED_OUTPATIENT_CLINIC_OR_DEPARTMENT_OTHER): Payer: Medicare Other | Admitting: Oncology

## 2016-10-11 VITALS — BP 139/75 | HR 86 | Temp 98.6°F | Resp 18 | Ht 66.0 in | Wt 249.0 lb

## 2016-10-11 DIAGNOSIS — L089 Local infection of the skin and subcutaneous tissue, unspecified: Secondary | ICD-10-CM

## 2016-10-11 DIAGNOSIS — C50412 Malignant neoplasm of upper-outer quadrant of left female breast: Secondary | ICD-10-CM | POA: Diagnosis not present

## 2016-10-11 LAB — CBC WITH DIFFERENTIAL/PLATELET
Basophils Absolute: 0.4 10*3/uL — ABNORMAL HIGH (ref 0.0–0.1)
Basophils Relative: 2 %
EOS ABS: 0 10*3/uL (ref 0.0–0.7)
EOS PCT: 0 %
HCT: 39.3 % (ref 36.0–46.0)
Hemoglobin: 13.1 g/dL (ref 12.0–15.0)
LYMPHS ABS: 3.5 10*3/uL (ref 0.7–4.0)
Lymphocytes Relative: 19 %
MCH: 32.8 pg (ref 26.0–34.0)
MCHC: 33.3 g/dL (ref 30.0–36.0)
MCV: 98.3 fL (ref 78.0–100.0)
MONO ABS: 3.3 10*3/uL — AB (ref 0.1–1.0)
Monocytes Relative: 18 %
NEUTROS PCT: 61 %
Neutro Abs: 11.1 10*3/uL — ABNORMAL HIGH (ref 1.7–7.7)
PLATELETS: 177 10*3/uL (ref 150–400)
RBC: 4 MIL/uL (ref 3.87–5.11)
RDW: 13 % (ref 11.5–15.5)
WBC: 18.3 10*3/uL — AB (ref 4.0–10.5)

## 2016-10-11 LAB — COMPREHENSIVE METABOLIC PANEL
ALT: 32 U/L (ref 14–54)
AST: 29 U/L (ref 15–41)
Albumin: 3.7 g/dL (ref 3.5–5.0)
Alkaline Phosphatase: 119 U/L (ref 38–126)
Anion gap: 7 (ref 5–15)
BUN: 8 mg/dL (ref 6–20)
CHLORIDE: 99 mmol/L — AB (ref 101–111)
CO2: 26 mmol/L (ref 22–32)
Calcium: 10 mg/dL (ref 8.9–10.3)
Creatinine, Ser: 0.91 mg/dL (ref 0.44–1.00)
Glucose, Bld: 109 mg/dL — ABNORMAL HIGH (ref 65–99)
POTASSIUM: 4.1 mmol/L (ref 3.5–5.1)
SODIUM: 132 mmol/L — AB (ref 135–145)
Total Bilirubin: 0.4 mg/dL (ref 0.3–1.2)
Total Protein: 7 g/dL (ref 6.5–8.1)

## 2016-10-11 MED ORDER — CLINDAMYCIN HCL 300 MG PO CAPS: 300.0000 mg | ORAL_CAPSULE | Freq: Four times a day (QID) | ORAL | 0 refills | Status: DC

## 2016-10-11 NOTE — Progress Notes (Signed)
Patient is seen as a work-in today.   She feels much improved compared to 2 days ago with regards to side effects of chemotherapy.  HOWEVER, her left arm soft tissue infection has grown in size.  She was started on Septra DS and she reports compliance with this medication.  She reports taking 5 doses without improved and the erythema has spread and it is more tender.  "I wish you can just pop it open."    She is afebrile.  She denies any recent draining from this area.  She notes tenderness to palpation which is difficult given its location and the natural instinct to have your upper extremities near the thorax of body.  She asks about hospital admission for IV antibiotics.  Clinically, I would say it is not indicated at this time.  She is concerned about Christmas coming up and the possibility that her arm may not improve requiring hospitalization during Christmas.  I have discussed the case with pharmacy for antibiotic recommendations.  They recommend Clindamycin given her antibiotic allergies.  I have also discussed the case with Dr. Sarajane Jews who is currently a hospitalist to confirm that there is not a need for hospitalization at this time.  He concurs and agrees with Clindamycin treatment.  Vitals: 139/75 P 86 R 18 T 98.1F O2 96% on RA.  Gen: NAD, pleasant HEENT: Atraumatic, normocephalic Skin: Warm and dry Left upper extremity: erythematous area measuring 11x10 cm in size with central nodule with palpable induration.    Assessment: 1. Soft tissue abscess/cellulitis  Plan: 1. D/C Septra DS 2. Rx for Clindamycin 300 mg PO QID x 10 days. 3. I have circled the erythematous area with a pen  4. Patient advised to report to the ED with significant increase in pain, fevers, chills, increase in erythema, etc. 5. Return as scheduled for follow-up.  Patient and plan discussed with Dr. Ancil Linsey and she is in agreement with the aforementioned.   Emanuele Mcwhirter,  PA-C 10/11/2016 1:05 PM

## 2016-10-11 NOTE — Patient Instructions (Signed)
[facility] at [facility] Discharge Instructions  RECOMMENDATIONS MADE BY THE CONSULTANT AND ANY TEST RESULTS WILL BE SENT TO YOUR REFERRING PHYSICIAN.  You were seen today by Kirby Crigler PA-C. Stop taking the Bactrim. Rx for Clindamycin sent to your pharmacy. Return for follow up with Dr. Whitney Muse on 12/28 @10 :50.  Thank you for choosing [place] at [facility] to provide your oncology and hematology care.  To afford each patient quality time with our provider, please arrive at least 15 minutes before your scheduled appointment time.   Beginning January 23rd 2017 lab work for the [facility] will be done in the  Main lab at [facility] on 1st floor. If you have a lab appointment with the [facility] please come in thru the  Main Entrance and check in at the main information desk  You need to re-schedule your appointment should you arrive 10 or more minutes late.  We strive to give you quality time with our providers, and arriving late affects you and other patients whose appointments are after yours.  Also, if you no show three or more times for appointments you may be dismissed from the clinic at the providers discretion.     Again, thank you for choosing [facility].  Our hope is that these requests will decrease the amount of time that you wait before being seen by our physicians.       _____________________________________________________________  Should you have questions after your visit to [facility], please contact our office at [phone] between the hours of 8:30 a.m. and 4:30 p.m.  Voicemails left after 4:30 p.m. will not be returned until the following business day.  For prescription refill requests, have your pharmacy contact our office.         Resources For Cancer Patients and their Caregivers ? American Cancer Society: Can assist with transportation, wigs, general needs, runs Look Good Feel  Better.        616-691-9101 ? Cancer Care: Provides financial assistance, online support groups, medication/co-pay assistance.  1-800-813-HOPE 424-541-7933) ? [place] Assists [place] cancer patients and their families through emotional , educational and financial support.  (925)106-0931 ? [place] DSS Where to apply for food stamps, Medicaid and utility assistance. (501)544-5741 ? RCATS: Transportation to medical appointments. 305-628-1283 ? Social Security Administration: May apply for disability if have a Stage IV cancer. 774-146-2998 8128306709 ? [employer], Disability and Transit Services: Assists with nutrition, care and transit needs. [place] Support Programs: @10RELATIVEDAYS @ > Cancer Support Group  2nd Tuesday of the month 1pm-2pm, Journey Room  > Creative Journey  3rd Tuesday of the month 1130am-1pm, Journey Room  > Look Good Feel Better  1st Wednesday of the month 10am-12 noon, Journey Room (Call [place] to register [phone])

## 2016-10-19 ENCOUNTER — Encounter (HOSPITAL_BASED_OUTPATIENT_CLINIC_OR_DEPARTMENT_OTHER): Payer: Medicare Other | Admitting: Hematology & Oncology

## 2016-10-19 ENCOUNTER — Ambulatory Visit (HOSPITAL_COMMUNITY)
Admission: RE | Admit: 2016-10-19 | Discharge: 2016-10-19 | Disposition: A | Discharge status: Home / Self Care | Payer: Medicare Other | Source: Ambulatory Visit | Attending: Hematology & Oncology | Admitting: Hematology & Oncology

## 2016-10-19 ENCOUNTER — Other Ambulatory Visit (HOSPITAL_COMMUNITY): Payer: Self-pay | Admitting: Oncology

## 2016-10-19 ENCOUNTER — Encounter (HOSPITAL_COMMUNITY): Payer: Self-pay | Admitting: Hematology & Oncology

## 2016-10-19 VITALS — BP 132/67 | HR 74 | Temp 97.6°F | Resp 18 | Wt 255.2 lb

## 2016-10-19 DIAGNOSIS — C50412 Malignant neoplasm of upper-outer quadrant of left female breast: Secondary | ICD-10-CM

## 2016-10-19 DIAGNOSIS — L089 Local infection of the skin and subcutaneous tissue, unspecified: Secondary | ICD-10-CM | POA: Insufficient documentation

## 2016-10-19 DIAGNOSIS — Z170 Estrogen receptor positive status [ER+]: Secondary | ICD-10-CM

## 2016-10-19 DIAGNOSIS — L02412 Cutaneous abscess of left axilla: Secondary | ICD-10-CM | POA: Diagnosis not present

## 2016-10-19 DIAGNOSIS — B370 Candidal stomatitis: Secondary | ICD-10-CM

## 2016-10-19 DIAGNOSIS — T451X5A Adverse effect of antineoplastic and immunosuppressive drugs, initial encounter: Secondary | ICD-10-CM

## 2016-10-19 DIAGNOSIS — K521 Toxic gastroenteritis and colitis: Secondary | ICD-10-CM

## 2016-10-19 DIAGNOSIS — R110 Nausea: Secondary | ICD-10-CM

## 2016-10-19 DIAGNOSIS — L03114 Cellulitis of left upper limb: Secondary | ICD-10-CM | POA: Diagnosis not present

## 2016-10-19 MED ORDER — FIRST-DUKES MOUTHWASH MT SUSP: 5.0000 mL | Freq: Four times a day (QID) | OROMUCOSAL | 3 refills | Status: DC | PRN

## 2016-10-19 MED ORDER — ONDANSETRON HCL 8 MG PO TABS: 8.0000 mg | ORAL_TABLET | Freq: Three times a day (TID) | ORAL | 2 refills | Status: DC | PRN

## 2016-10-19 NOTE — Patient Instructions (Addendum)
[facility] at [facility] Discharge Instructions  RECOMMENDATIONS MADE BY THE CONSULTANT AND ANY TEST RESULTS WILL BE SENT TO YOUR REFERRING PHYSICIAN.  You were seen today by Dr. Whitney Muse Go downstairs for ultrasound of left upper arm and then return to clinic to see Dr. Whitney Muse again Your next appointment is scheduled for 1/2  Thank you for choosing [place] at [facility] to provide your oncology and hematology care.  To afford each patient quality time with our provider, please arrive at least 15 minutes before your scheduled appointment time.    If you have a lab appointment with the [facility] please come in thru the  Main Entrance and check in at the main information desk  You need to re-schedule your appointment should you arrive 10 or more minutes late.  We strive to give you quality time with our providers, and arriving late affects you and other patients whose appointments are after yours.  Also, if you no show three or more times for appointments you may be dismissed from the clinic at the providers discretion.     Again, thank you for choosing [facility].  Our hope is that these requests will decrease the amount of time that you wait before being seen by our physicians.       _____________________________________________________________  Should you have questions after your visit to [facility], please contact our office at [phone] between the hours of 8:30 a.m. and 4:30 p.m.  Voicemails left after 4:30 p.m. will not be returned until the following business day.  For prescription refill requests, have your pharmacy contact our office.       Resources For Cancer Patients and their Caregivers ? American Cancer Society: Can assist with transportation, wigs, general needs, runs Look Good Feel Better.        ([phone] ? Cancer Care: Provides financial assistance, online support groups,  medication/co-pay assistance.  1-800-813-HOPE (540)034-4824) ? [place] Assists [place] cancer patients and their families through emotional , educational and financial support.  828-471-6424 ? [place] DSS Where to apply for food stamps, Medicaid and utility assistance. 719 112 6154 ? [employer]: Transportation to medical appointments. (726)143-3999 ? Social Security Administration: May apply for disability if have a Stage IV cancer. [phone] 8673565736 ? [employer], Disability and Transit Services: Assists with nutrition, care and transit needs. [place] Support Programs: @10RELATIVEDAYS @ > Cancer Support Group  2nd Tuesday of the month 1pm-2pm, Journey Room  > Creative Journey  3rd Tuesday of the month 1130am-1pm, Journey Room  > Look Good Feel Better  1st Wednesday of the month 10am-12 noon, Journey Room (Call [place] to register [phone])

## 2016-10-19 NOTE — Progress Notes (Signed)
[facility]  PROGRESS NOTE  Patient Care Team: Redmond School, MD as PCP - General (Internal Medicine)  CHIEF COMPLAINTS/PURPOSE OF CONSULTATION:  Left breast cancer ER+ PR- HER 2 -    Breast cancer of upper-outer quadrant of left female breast ([place])   07/26/2016 Mammogram    Area of developing asymmetry with distortion located within the upper-outer quadrant left breast. Tissue sampling via tomosynthesis guided biopsy is recommended and will be scheduled.  RECOMMENDATION: Left breast tomosynthesis guided biopsy. This has been scheduled for 07/28/2016.       07/28/2016 Initial Biopsy    Coil shaped clip corresponds to the asymmetry in the outer left breast. Clip is well positioned at the biopsy site. 2. X shaped clip corresponds to the ASYMMETRY/DISTORTION in the upper-outer quadrant of the left breast. Clip is positioned approximately 1 cm medial to the center of the biopsy cavity      07/28/2016 Pathology Results    Estrogen Receptor: 80%, POSITIVE, MODERATE STAINING INTENSITY Progesterone Receptor: 0%, NEGATIVE Proliferation Marker Ki67: 70% HER 2 negative by FISH Breast, left, needle core biopsy, upper outer quadrant - INVASIVE DUCTAL CARCINOMA, SEE COMMENT. - HIGH GRADE DUCTAL CARCINOMA IN SITU WITH NECROSIS.        HISTORY OF PRESENTING ILLNESS:  Tracy Valentine 60 y.o. female is here for follow up of left breast cancer ER+/PR-/HER2-. Ki67 is 70%.   Tracy Valentine return to the [facility] today accompanied by her aunt.   She had dry heaves after her first cycle of treatment. She denies vomiting. She managed this with Coke at first but began taking antiemetics after it persisted.  She had diarrhea after her first cycle of treatment. She doesn't like to take imodium so she started by taking Pepto Bismol. When this did not work she finally took Imodium which resolved it. She isn't sure how many times or how many days, but "It was a lot". She was  unable to sleep because of this. Her aunt states it may have been 3 to 5 days. She was unable to eat food during this time, but drank Coke and ate Jello.  She also had hematuria over the weekend. This has resolved after taking antibiotics. However, the knot under her left arm has not fully resolved, though the redness is better. Her aunt believes her deodorant may be irritating this area as well.  She is eating well now. She is wearing a wig.   She is requesting a refill of her mouthwash because she left it at her brother's. She currently denies any mouth sores. She notes that overall she feels well now.    MEDICAL HISTORY:  Past Medical History:  Diagnosis Date  . Anxiety   . Breast cancer (HCC)    breast  . Chronic pain   . DDD (degenerative disc disease), cervical   . DDD (degenerative disc disease), lumbar   . Degenerative disc disease at L5-S1 level   . Depression   . GERD (gastroesophageal reflux disease)   . History of kidney stones   . Hyperlipidemia   . Hypothyroidism   . Obesity   . Osteoarthritis   . PONV (postoperative nausea and vomiting)   . Sleep apnea    uses CPAP  . Spinal stenosis   . Spinal stenosis     SURGICAL HISTORY: Past Surgical History:  Procedure Laterality Date  . ABDOMINAL HYSTERECTOMY     partial hyst, still have ovaries  . BREAST LUMPECTOMY WITH RADIOACTIVE SEED  AND SENTINEL LYMPH NODE BIOPSY Left 08/24/2016   Procedure: LEFT BREAST PARTIAL MASTECTOMY WITH RADIOACTIVE SEED AND LEFT SENTINEL LYMPH NODE MAPPING;  Surgeon: Erroll Luna, MD;  Location: [facility];  Service: General;  Laterality: Left;  . CHOLECYSTECTOMY    . FOOT FOREIGN BODY REMOVAL Left   . PORTACATH PLACEMENT Right 09/29/2016   Procedure: INSERTION PORT-A-CATH RIGHT SUBCLAVIAN;  Surgeon: Aviva Signs, MD;  Location: [facility];  Service: General;  Laterality: Right;    SOCIAL HISTORY: Social History   Social History  . Marital status: Married    Spouse name:  N/A  . Number of children: N/A  . Years of education: N/A   Occupational History  . Not on file.   Social History Main Topics  . Smoking status: Never Smoker  . Smokeless tobacco: Never Used  . Alcohol use No  . Drug use: No  . Sexual activity: Yes    Birth control/ protection: None     Comment: married-1 grown son   Other Topics Concern  . Not on file   Social History Narrative  . No narrative on file  1 child; No grandchildren  Never smoked No alcohol Has pets (cat and three dogs)  FAMILY HISTORY: Family History  Problem Relation Age of Onset  . COPD Mother   . Heart attack Father   . Diabetes Sister   . Heart disease Sister   . Kidney cancer Brother   . Breast cancer Cousin   . Kidney failure Sister   . Thyroid disease Sister     Half sister and 2 half brother. 1 full brother. No Breast cancer in immediate family. Paternal cousin had breast cancer. Mother diagnosed with ovarian cancer at 12 years. Father passed from heart attack.   ALLERGIES:  is allergic to adhesive [tape]; amoxicillin; darvocet [propoxyphene n-acetaminophen]; doxycycline; erythromycin; and zithromax [azithromycin].  MEDICATIONS:  Current Outpatient Prescriptions  Medication Sig Dispense Refill  . ALPRAZolam (XANAX) 1 MG tablet Take 1 mg by mouth 4 (four) times daily as needed for sleep.    [name] Kitchen aspirin 325 MG tablet Take 325 mg by mouth daily.    . Cholecalciferol (VITAMIN D) 2000 UNITS CAPS Take 2,000 Units by mouth daily.    . clindamycin (CLEOCIN) 300 MG capsule Take 1 capsule (300 mg total) by mouth 4 (four) times daily. 40 capsule 0  . Cyclophosphamide (CYTOXAN IJ) Inject as directed every 21 ( twenty-one) days. To start 10/03/16    . dexamethasone (DECADRON) 4 MG tablet The day before, day of, and day after chemo take 2 tablets in the am and 2 tablets in the pm. Take with food. 36 tablet 1  . Diphenhyd-Hydrocort-Nystatin (FIRST-DUKES MOUTHWASH) SUSP Use as directed 5 mLs in the mouth or  throat 4 (four) times daily as needed. 300 mL 1  . DOCEtaxel (TAXOTERE IV) Inject into the vein every 21 ( twenty-one) days. To start 10/03/16    . docusate sodium (COLACE) 50 MG capsule Take 100 mg by mouth 2 (two) times daily.    . fluconazole (DIFLUCAN) 100 MG tablet Take 1 tablet (100 mg total) by mouth daily. 10 tablet 1  . fluticasone (FLONASE) 50 MCG/ACT nasal spray Place 1 spray into both nostrils daily.    [name] Kitchen ipratropium (ATROVENT) 0.03 % nasal spray 1 spray daily.  4  . KRILL OIL PO Take 1 capsule by mouth daily.    [name] Kitchen levothyroxine (SYNTHROID, LEVOTHROID) 100 MCG tablet Take 100 mcg by mouth daily.  5  .  lidocaine-prilocaine (EMLA) cream Apply a quarter size amount to port site 1 hour prior to chemo. Do not rub in. Cover with plastic wrap. 30 g 3  . meloxicam (MOBIC) 7.5 MG tablet   0  . montelukast (SINGULAIR) 10 MG tablet Take 10 mg by mouth daily.  11  . ondansetron (ZOFRAN) 8 MG tablet Take 1 tablet (8 mg total) by mouth every 8 (eight) hours as needed for nausea or vomiting. 30 tablet 2  . Oxycodone HCl 10 MG TABS   0  . Pegfilgrastim (NEULASTA ONPRO [place]) Inject into the skin. To be administered 27 hours after the completion of chemo every 21 days.    . potassium chloride SA (K-DUR,KLOR-CON) 20 MEQ tablet Take 2 tablets (40 mEq total) by mouth daily. 60 tablet 0  . prochlorperazine (COMPAZINE) 10 MG tablet Take 1 tablet (10 mg total) by mouth every 6 (six) hours as needed for nausea or vomiting. 30 tablet 2  . senna (SENOKOT) 8.6 MG TABS tablet Take 1 tablet by mouth at bedtime as needed.    . simvastatin (ZOCOR) 20 MG tablet Take 20 mg by mouth every evening.    . sulfamethoxazole-trimethoprim (BACTRIM DS,SEPTRA DS) 800-160 MG tablet   0  . venlafaxine (EFFEXOR) 75 MG tablet Take 75 mg by mouth 2 (two) times daily.    [name] Kitchen zolpidem (AMBIEN) 10 MG tablet Take 10 mg by mouth at bedtime.     No current facility-administered medications for this visit.     Review of Systems    Constitutional: Negative.   HENT: Negative.   Eyes: Negative.   Respiratory: Negative.   Cardiovascular: Negative.   Gastrointestinal: Positive for diarrhea (with treatment) and nausea (with treatment).  Genitourinary: Negative.   Musculoskeletal: Negative.   Skin: Positive for rash.       Knot underneath left arm with associated redness  Neurological: Negative.   Endo/Heme/Allergies: Negative.   Psychiatric/Behavioral: Negative.   All other systems reviewed and are negative. 14 point ROS was done and is otherwise as detailed above or in HPI   PHYSICAL EXAMINATION: ECOG PERFORMANCE STATUS: 0 - Asymptomatic  Vitals:   10/19/16 1112  BP: 132/67  Pulse: 74  Resp: 18  Temp: 97.6 F (36.4 C)   Filed Weights   10/19/16 1112  Weight: 255 lb 3.2 oz (115.8 kg)   Physical Exam  Constitutional: She is oriented to person, place, and time and well-developed, well-nourished, and in no distress.  HENT:  Head: Normocephalic and atraumatic.  Nose: Nose normal.  Mouth/Throat: Oropharynx is clear and moist. No oropharyngeal exudate.  Wearing hair prosthesis  Eyes: Conjunctivae and EOM are normal. Pupils are equal, round, and reactive to light. Right eye exhibits no discharge. Left eye exhibits no discharge. No scleral icterus.  Neck: Normal range of motion. Neck supple. No tracheal deviation present. No thyromegaly present.  Cardiovascular: Normal rate, regular rhythm and normal heart sounds.  Exam reveals no gallop and no friction rub.   No murmur heard. Pulmonary/Chest: Effort normal and breath sounds normal. She has no wheezes. She has no rales.  Abdominal: Soft. Bowel sounds are normal. She exhibits no distension and no mass. There is no tenderness. There is no rebound and no guarding.  Musculoskeletal: Normal range of motion. She exhibits no edema.  Lymphadenopathy:    She has no cervical adenopathy.  Neurological: She is alert and oriented to person, place, and time. She has  normal reflexes. No cranial nerve deficit. Gait normal. Coordination normal.  Skin: Skin is warm and dry. No rash noted.  Left upper extremity abscess on the inner aspect of arm, erythema improved, size approx 2 cm  Psychiatric: Mood, memory, affect and judgment normal.  Nursing note and vitals reviewed. Minor bruising at prior biopsy sites  LABORATORY DATA:  I have reviewed the data as listed Lab Results  Component Value Date   WBC 18.3 (H) 10/11/2016   HGB 13.1 10/11/2016   HCT 39.3 10/11/2016   MCV 98.3 10/11/2016   PLT 177 10/11/2016   CMP     Component Value Date/Time   NA 132 (L) 10/11/2016 0900   K 4.1 10/11/2016 0900   CL 99 (L) 10/11/2016 0900   CO2 26 10/11/2016 0900   GLUCOSE 109 (H) 10/11/2016 0900   BUN 8 10/11/2016 0900   CREATININE 0.91 10/11/2016 0900   CALCIUM 10.0 10/11/2016 0900   PROT 7.0 10/11/2016 0900   ALBUMIN 3.7 10/11/2016 0900   AST 29 10/11/2016 0900   ALT 32 10/11/2016 0900   ALKPHOS 119 10/11/2016 0900   BILITOT 0.4 10/11/2016 0900   GFRNONAA >60 10/11/2016 0900   GFRAA >60 10/11/2016 0900     RADIOGRAPHIC STUDIES: I have personally reviewed the radiological images as listed and agreed with the findings in the report.  Study Result   CLINICAL DATA:  Status post 2 stereotactic guided biopsies of the left breast earlier today.  EXAM: DIAGNOSTIC LEFT MAMMOGRAM POST STEREOTACTIC BIOPSY  COMPARISON:  Previous exam(s).  FINDINGS: Mammographic images were obtained following stereotactic guided biopsy of an asymmetry within the outer left breast and an asymmetry/distortion in the upper outer quadrant of the left breast. Coil shaped clip corresponds to the asymmetry in the outer left breast. X shaped clip corresponds to the asymmetry/distortion in the upper-outer quadrant of the left breast.  IMPRESSION: Postprocedure mammogram for clip placement.  1. Coil shaped clip corresponds to the asymmetry in the outer left breast. Clip  is well positioned at the biopsy site. 2. X shaped clip corresponds to the ASYMMETRY/DISTORTION in the upper-outer quadrant of the left breast. Clip is positioned approximately 1 cm medial to the center of the biopsy cavity.  Final Assessment: Post Procedure Mammograms for Marker Placement   Electronically Signed   By: Franki Cabot M.D.   On: 07/28/2016 11:54   Addendum   ADDENDUM REPORT: 07/31/2016 13:53  ADDENDUM: Pathology revealed BENIGN BREAST TISSUE of the Left breast, outer. GRADE III INVASIVE DUCTAL CARCINOMA, HIGH GRADE DUCTAL CARCINOMA IN SITU WITH NECROSIS of the Left breast, upper outer quadrant. This was found to be concordant by Dr. Franki Cabot. Pathology results were discussed with the patient by telephone. The patient reported doing well after the biopsies with tenderness at the sites. Post biopsy instructions and care were reviewed and questions were answered. The patient was encouraged to call [facility] for any additional concerns. Surgical consultation has been arranged with Dr. Erroll Luna at [facility] on August 07, 2016. The patient is scheduled for a Left axillary ultrasound at [facility] on August 07, 2016.  Pathology results reported by Terie Purser, RN on 07/31/2016.   Electronically Signed   By: Franki Cabot M.D.   On: 07/31/2016 13:53   Addended by Michiel Cowboy, MD on 07/31/2016 2:04 PM    Study Result   CLINICAL DATA:  Patient with a left breast asymmetry/distortion presenting for stereotactic guided biopsy.  EXAM: LEFT BREAST STEREOTACTIC CORE NEEDLE BIOPSY x2  COMPARISON:  Previous exams.  FINDINGS: The patient and I discussed the procedure of stereotactic-guided biopsy including benefits and alternatives. We discussed the high likelihood of a successful procedure. We discussed the risks of the procedure including infection, bleeding, tissue injury,  clip migration, and inadequate sampling. Informed written consent was given. The usual time out protocol was performed immediately prior to the procedure.  Using sterile technique and 1% Lidocaine as local anesthetic, under stereotactic guidance, a 9 gauge vacuum assisted device was used to perform core needle biopsy of an asymmetry in the outer left breast using a superior approach.  At the conclusion of the procedure, a COIL SHAPED tissue marker was placed at the biopsy site. Postprocedure diagnostic mammogram showed the coil shaped clip to correspond to the site of a left breast asymmetry identified on the CC projection, but not corresponding to the additional area of asymmetry/distortion in the upper left breast.  As such, patient was prepped for a second stereotactic guided biopsy for the additional asymmetry/distortion in the upper left breast.  Using sterile technique and 1% lidocaine as local anesthetic, under stereotactic guidance, a 9 gauge vacuum assisted device was used to perform core needle biopsy of the asymmetry/distortion in the upper-outer quadrant of the left breast using a lateral approach.  At the conclusion of the procedure, an X SHAPED tissue marker was placed at the biopsy site. Postprocedure mammogram showed the X shaped clip to correspond to the site of the ASYMMETRY/DISTORTION in the upper-outer quadrant of the left breast, approximately 1 cm medial to the center of the biopsy cavity.  IMPRESSION: 1. Stereotactic guided biopsy of the asymmetry in the outer left breast. COIL SHAPED tissue marker placed at the biopsy site. 2. Stereotactic guided biopsy of the additional ASYMMETRY/DISTORTION in the upper-outer quadrant of the left breast. X shaped tissue marker placed at the biopsy site. This biopsy clip is approximately 1 cm medial to the center of the biopsy site.  Electronically Signed: By: Franki Cabot M.D. On: 07/28/2016 11:34       PATHOLOGY:       ASSESSMENT & PLAN:  L breast Cancer ER+ PR- HER 2 - Oncotype recurrence score 30 LUE abscess Chemotherapy induced diarrhea   She experienced severe nausea and diarrhea after cycle #1. Will add EMEND to her next cycle. May need to consider a mild dose reduction of taxotere.  I have ordered an ultrasound of the left upper extremity abscess. This area may need to be surgically drained prior to her next cycle of chemotherapy, will try to get her down to Dr. Brantley Stage.  She is to come back up to clinic today after her ultrasound.  I have refilled her Magic Mouthwash.  She is scheduled to return for follow up on 10/24/2016 with her next treatment cycle.  Orders Placed This Encounter  Procedures  . [place] Extrem Up Left Ltd    Standing Status:   Future    Number of Occurrences:   1    Standing Expiration Date:   12/20/2017    Order Specific Question:   Reason for Exam (SYMPTOM  OR DIAGNOSIS REQUIRED)    Answer:   Evaluate for abscess    Order Specific Question:   Preferred imaging location?    Answer:   [facility]   Meds ordered this encounter  Medications  . DISCONTD: Diphenhyd-Hydrocort-Nystatin (FIRST-DUKES MOUTHWASH) SUSP    Sig: Use as directed 5 mLs in the mouth or throat 4 (four) times daily as needed.  Dispense:  300 mL    Refill:  3  . Diphenhyd-Hydrocort-Nystatin (FIRST-DUKES MOUTHWASH) SUSP    Sig: Use as directed 5 mLs in the mouth or throat 4 (four) times daily as needed.    Dispense:  300 mL    Refill:  3    This document serves as a record of services personally performed by Ancil Linsey, MD. It was created on her behalf by Arlyce Harman, a trained medical scribe. The creation of this record is based on the scribe's personal observations and the provider's statements to them. This document has been checked and approved by the attending provider.  I have reviewed the above documentation for accuracy and completeness and I agree  with the above.  This note was electronically signed.    Molli Hazard, MD  10/19/2016 11:26 AM

## 2016-10-20 ENCOUNTER — Encounter (HOSPITAL_COMMUNITY): Payer: Self-pay | Admitting: Hematology & Oncology

## 2016-10-24 ENCOUNTER — Encounter (HOSPITAL_BASED_OUTPATIENT_CLINIC_OR_DEPARTMENT_OTHER): Payer: Medicare Other | Admitting: Hematology & Oncology

## 2016-10-24 ENCOUNTER — Encounter (HOSPITAL_COMMUNITY): Payer: Self-pay | Admitting: Hematology & Oncology

## 2016-10-24 ENCOUNTER — Encounter (HOSPITAL_COMMUNITY): Payer: Medicare Other | Attending: Hematology & Oncology

## 2016-10-24 ENCOUNTER — Encounter: Payer: Self-pay | Admitting: *Deleted

## 2016-10-24 VITALS — BP 144/75 | HR 65 | Temp 97.7°F | Resp 16 | Wt 251.8 lb

## 2016-10-24 DIAGNOSIS — K521 Toxic gastroenteritis and colitis: Secondary | ICD-10-CM | POA: Diagnosis not present

## 2016-10-24 DIAGNOSIS — R110 Nausea: Secondary | ICD-10-CM | POA: Insufficient documentation

## 2016-10-24 DIAGNOSIS — B370 Candidal stomatitis: Secondary | ICD-10-CM | POA: Diagnosis present

## 2016-10-24 DIAGNOSIS — Z5111 Encounter for antineoplastic chemotherapy: Secondary | ICD-10-CM | POA: Diagnosis not present

## 2016-10-24 DIAGNOSIS — L089 Local infection of the skin and subcutaneous tissue, unspecified: Secondary | ICD-10-CM | POA: Diagnosis present

## 2016-10-24 DIAGNOSIS — C50412 Malignant neoplasm of upper-outer quadrant of left female breast: Secondary | ICD-10-CM

## 2016-10-24 DIAGNOSIS — T451X5A Adverse effect of antineoplastic and immunosuppressive drugs, initial encounter: Secondary | ICD-10-CM

## 2016-10-24 DIAGNOSIS — Z5189 Encounter for other specified aftercare: Secondary | ICD-10-CM | POA: Diagnosis not present

## 2016-10-24 DIAGNOSIS — Z170 Estrogen receptor positive status [ER+]: Secondary | ICD-10-CM

## 2016-10-24 DIAGNOSIS — R300 Dysuria: Secondary | ICD-10-CM | POA: Insufficient documentation

## 2016-10-24 DIAGNOSIS — E876 Hypokalemia: Secondary | ICD-10-CM | POA: Diagnosis present

## 2016-10-24 LAB — CBC WITH DIFFERENTIAL/PLATELET
BASOS ABS: 0 10*3/uL (ref 0.0–0.1)
Basophils Relative: 0 %
EOS PCT: 0 %
Eosinophils Absolute: 0 10*3/uL (ref 0.0–0.7)
HCT: 39.3 % (ref 36.0–46.0)
Hemoglobin: 13.1 g/dL (ref 12.0–15.0)
Lymphocytes Relative: 8 %
Lymphs Abs: 1.1 10*3/uL (ref 0.7–4.0)
MCH: 33.1 pg (ref 26.0–34.0)
MCHC: 33.3 g/dL (ref 30.0–36.0)
MCV: 99.2 fL (ref 78.0–100.0)
Monocytes Absolute: 0.3 10*3/uL (ref 0.1–1.0)
Monocytes Relative: 2 %
Neutro Abs: 12.6 10*3/uL — ABNORMAL HIGH (ref 1.7–7.7)
Neutrophils Relative %: 90 %
PLATELETS: 398 10*3/uL (ref 150–400)
RBC: 3.96 MIL/uL (ref 3.87–5.11)
RDW: 13.8 % (ref 11.5–15.5)
WBC: 14 10*3/uL — AB (ref 4.0–10.5)

## 2016-10-24 LAB — COMPREHENSIVE METABOLIC PANEL
ALK PHOS: 103 U/L (ref 38–126)
ALT: 24 U/L (ref 14–54)
AST: 23 U/L (ref 15–41)
Albumin: 3.9 g/dL (ref 3.5–5.0)
Anion gap: 6 (ref 5–15)
BILIRUBIN TOTAL: 0.4 mg/dL (ref 0.3–1.2)
BUN: 14 mg/dL (ref 6–20)
CALCIUM: 9.9 mg/dL (ref 8.9–10.3)
CO2: 27 mmol/L (ref 22–32)
CREATININE: 0.74 mg/dL (ref 0.44–1.00)
Chloride: 102 mmol/L (ref 101–111)
GFR calc Af Amer: >60 mL/min (ref >60)
Glucose, Bld: 132 mg/dL — ABNORMAL HIGH (ref 65–99)
Potassium: 4.2 mmol/L (ref 3.5–5.1)
Sodium: 135 mmol/L (ref 135–145)
TOTAL PROTEIN: 7.3 g/dL (ref 6.5–8.1)

## 2016-10-24 MED ORDER — KETOROLAC TROMETHAMINE 15 MG/ML IJ SOLN
7.5000 mg | Freq: Once | INTRAMUSCULAR | Status: AC
  Administered 2016-10-24: 7.5 mg via INTRAVENOUS
  Filled 2016-10-24: qty 1

## 2016-10-24 MED ORDER — MORPHINE SULFATE (PF) 2 MG/ML IV SOLN
INTRAVENOUS | Status: AC
  Filled 2016-10-24: qty 1

## 2016-10-24 MED ORDER — FOSAPREPITANT DIMEGLUMINE INJECTION 150 MG
Freq: Once | INTRAVENOUS | Status: AC
  Administered 2016-10-24: 10:00:00 via INTRAVENOUS
  Filled 2016-10-24: qty 5

## 2016-10-24 MED ORDER — SODIUM CHLORIDE 0.9 % IV SOLN
600.0000 mg/m2 | Freq: Once | INTRAVENOUS | Status: AC
  Administered 2016-10-24: 1380 mg via INTRAVENOUS
  Filled 2016-10-24: qty 19

## 2016-10-24 MED ORDER — PALONOSETRON HCL INJECTION 0.25 MG/5ML
0.2500 mg | Freq: Once | INTRAVENOUS | Status: AC
Start: 2016-10-24 — End: 2016-10-24
  Administered 2016-10-24: 0.25 mg via INTRAVENOUS
  Filled 2016-10-24: qty 5

## 2016-10-24 MED ORDER — HEPARIN SOD (PORK) LOCK FLUSH 100 UNIT/ML IV SOLN
500.0000 [IU] | Freq: Once | INTRAVENOUS | Status: AC | PRN
  Administered 2016-10-24: 500 [IU]
  Filled 2016-10-24: qty 5

## 2016-10-24 MED ORDER — DOCETAXEL CHEMO INJECTION 160 MG/16ML
65.0000 mg/m2 | Freq: Once | INTRAVENOUS | Status: AC
  Administered 2016-10-24: 150 mg via INTRAVENOUS
  Filled 2016-10-24: qty 15

## 2016-10-24 MED ORDER — MORPHINE SULFATE 2 MG/ML IJ SOLN
2.0000 mg | INTRAMUSCULAR | Status: DC | PRN
  Administered 2016-10-24: 2 mg via INTRAVENOUS
  Filled 2016-10-24 (×2): qty 1

## 2016-10-24 MED ORDER — SODIUM CHLORIDE 0.9 % IV SOLN
Freq: Once | INTRAVENOUS | Status: AC
  Administered 2016-10-24: 10:00:00 via INTRAVENOUS

## 2016-10-24 MED ORDER — SODIUM CHLORIDE 0.9% FLUSH
10.0000 mL | INTRAVENOUS | Status: DC | PRN
  Administered 2016-10-24: 10 mL
  Filled 2016-10-24: qty 10

## 2016-10-24 MED ORDER — PEGFILGRASTIM 6 MG/0.6ML ~~LOC~~ PSKT
6.0000 mg | PREFILLED_SYRINGE | Freq: Once | SUBCUTANEOUS | Status: AC
  Administered 2016-10-24: 6 mg via SUBCUTANEOUS
  Filled 2016-10-24: qty 0.6

## 2016-10-24 NOTE — Patient Instructions (Signed)
[facility] Discharge Instructions for Patients Receiving Chemotherapy   Beginning January 23rd 2017 lab work for the [facility] will be done in the  Main lab at [facility] on 1st floor. If you have a lab appointment with the [facility] please come in thru the  Main Entrance and check in at the main information desk   Today you received the following chemotherapy agents taxotere and cytoxen  To help prevent nausea and vomiting after your treatment, we encourage you to take your nausea medication     If you develop nausea and vomiting, or diarrhea that is not controlled by your medication, call the clinic.  The clinic phone number is [phone]. Office hours are Monday-Friday 8:30am-5:00pm.  BELOW ARE SYMPTOMS THAT SHOULD BE REPORTED IMMEDIATELY:  *FEVER GREATER THAN 101.0 F  *CHILLS WITH OR WITHOUT FEVER  NAUSEA AND VOMITING THAT IS NOT CONTROLLED WITH YOUR NAUSEA MEDICATION  *UNUSUAL SHORTNESS OF BREATH  *UNUSUAL BRUISING OR BLEEDING  TENDERNESS IN MOUTH AND THROAT WITH OR WITHOUT PRESENCE OF ULCERS  *URINARY PROBLEMS  *BOWEL PROBLEMS  UNUSUAL RASH Items with * indicate a potential emergency and should be followed up as soon as possible. If you have an emergency after office hours please contact your primary care physician or go to the nearest emergency department.  Please call the clinic during office hours if you have any questions or concerns.   You may also contact the Patient Navigator at [phone] should you have any questions or need assistance in obtaining follow up care.      Resources For Cancer Patients and their Caregivers ? American Cancer Society: Can assist with transportation, wigs, general needs, runs Look Good Feel Better.        831-726-2431 ? Cancer Care: Provides financial assistance, online support groups, medication/co-pay assistance.  1-800-813-HOPE 223-250-2918) ? [facility] Assists  [place] cancer patients and their families through emotional , educational and financial support.  (501)301-9866 ? [place] DSS Where to apply for food stamps, Medicaid and utility assistance. (779)804-3097 ? [facility]: Transportation to medical appointments. 463-585-2841 ? Social Security Administration: May apply for disability if have a Stage IV cancer. ([phone] 791-5060 939-202-1307 ? [employer], Disability and Transit Services: Assists with nutrition, care and transit needs. 816-589-4696

## 2016-10-24 NOTE — Progress Notes (Signed)
[facility]  PROGRESS NOTE  Patient Care Team: Redmond School, MD as PCP - General (Internal Medicine)  CHIEF COMPLAINTS/PURPOSE OF CONSULTATION:  Left breast cancer ER+ PR- HER 2 -    Breast cancer of upper-outer quadrant of left female breast ([place])   07/26/2016 Mammogram    Area of developing asymmetry with distortion located within the upper-outer quadrant left breast. Tissue sampling via tomosynthesis guided biopsy is recommended and will be scheduled.  RECOMMENDATION: Left breast tomosynthesis guided biopsy. This has been scheduled for 07/28/2016.       07/28/2016 Initial Biopsy    Coil shaped clip corresponds to the asymmetry in the outer left breast. Clip is well positioned at the biopsy site. 2. X shaped clip corresponds to the ASYMMETRY/DISTORTION in the upper-outer quadrant of the left breast. Clip is positioned approximately 1 cm medial to the center of the biopsy cavity      07/28/2016 Pathology Results    Estrogen Receptor: 80%, POSITIVE, MODERATE STAINING INTENSITY Progesterone Receptor: 0%, NEGATIVE Proliferation Marker Ki67: 70% HER 2 negative by FISH Breast, left, needle core biopsy, upper outer quadrant - INVASIVE DUCTAL CARCINOMA, SEE COMMENT. - HIGH GRADE DUCTAL CARCINOMA IN SITU WITH NECROSIS.      08/24/2016 Oncotype testing    Recurrence Score Result 30, 10 year risk of distant recurrence Tamoxifen alone 20%      10/03/2016 -  Chemotherapy    The patient had palonosetron (ALOXI) injection 0.25 mg, 0.25 mg, Intravenous,  Once, 2 of 4 cycles Administration: 0.25 mg (10/24/2016)  pegfilgrastim (NEULASTA ONPRO KIT) injection 6 mg, 6 mg, Subcutaneous, Once, 2 of 4 cycles Administration: 6 mg (10/24/2016)  cyclophosphamide (CYTOXAN) 1,380 mg in sodium chloride 0.9 % 250 mL chemo infusion, 600 mg/m2 = 1,380 mg, Intravenous,  Once, 2 of 4 cycles Administration: 1,380 mg (10/24/2016)  DOCEtaxel (TAXOTERE) 170 mg in dextrose 5 % 250 mL chemo  infusion, 75 mg/m2 = 170 mg, Intravenous,  Once, 2 of 4 cycles Dose modification: 65 mg/m2 (original dose 75 mg/m2, Cycle 2, Reason: Dose not tolerated)  fosaprepitant (EMEND) 150 mg, dexamethasone (DECADRON) 12 mg in sodium chloride 0.9 % 145 mL IVPB, , Intravenous,  Once, 1 of 3 cycles Administration:  (10/24/2016)  for chemotherapy treatment.          HISTORY OF PRESENTING ILLNESS:  Tracy Valentine 61 y.o. female is here for follow up of left breast cancer ER+/PR-/HER2-. Ki67 is 70%.   Tracy Valentine returns to the [facility] today accompanied by her aunt and presents in treatment chair. She is here for Cycle #2 TC.  She had the abscess on the upper inner aspect of the Left extremity lanced/drained. It is no longer painful nor red. She notes that it is no longer 'warm" to the touch. It is markedly improved.  She reports severe nausea with cycle #1 treatment. She is requesting medication to help. She drank Coke to manage her nausea. She took Zofran, her other nausea medication, and her "nerve pill" this morning.  We have added Emend.   She reports diarrhea with treatment. We again reviewed appropriate management of chemotherapy induced diarrhea.   She reports minor feet and leg swelling. No fever or chills. She had a good holiday. Appetite has returned.   MEDICAL HISTORY:  Past Medical History:  Diagnosis Date  . Anxiety   . Breast cancer (HCC)    breast  . Chronic pain   . DDD (degenerative disc disease), cervical   .  DDD (degenerative disc disease), lumbar   . Degenerative disc disease at L5-S1 level   . Depression   . GERD (gastroesophageal reflux disease)   . History of kidney stones   . Hyperlipidemia   . Hypothyroidism   . Obesity   . Osteoarthritis   . PONV (postoperative nausea and vomiting)   . Sleep apnea    uses CPAP  . Spinal stenosis   . Spinal stenosis     SURGICAL HISTORY: Past Surgical History:  Procedure Laterality Date  . ABDOMINAL  HYSTERECTOMY     partial hyst, still have ovaries  . BREAST LUMPECTOMY WITH RADIOACTIVE SEED AND SENTINEL LYMPH NODE BIOPSY Left 08/24/2016   Procedure: LEFT BREAST PARTIAL MASTECTOMY WITH RADIOACTIVE SEED AND LEFT SENTINEL LYMPH NODE MAPPING;  Surgeon: Erroll Luna, MD;  Location: [facility];  Service: General;  Laterality: Left;  . CHOLECYSTECTOMY    . FOOT FOREIGN BODY REMOVAL Left   . PORTACATH PLACEMENT Right 09/29/2016   Procedure: INSERTION PORT-A-CATH RIGHT SUBCLAVIAN;  Surgeon: Aviva Signs, MD;  Location: [facility];  Service: General;  Laterality: Right;    SOCIAL HISTORY: Social History   Social History  . Marital status: Married    Spouse name: N/A  . Number of children: N/A  . Years of education: N/A   Occupational History  . Not on file.   Social History Main Topics  . Smoking status: Never Smoker  . Smokeless tobacco: Never Used  . Alcohol use No  . Drug use: No  . Sexual activity: Yes    Birth control/ protection: None     Comment: married-1 grown son   Other Topics Concern  . Not on file   Social History Narrative  . No narrative on file  1 child; No grandchildren  Never smoked No alcohol Has pets (cat and three dogs)  FAMILY HISTORY: Family History  Problem Relation Age of Onset  . COPD Mother   . Heart attack Father   . Diabetes Sister   . Heart disease Sister   . Kidney cancer Brother   . Breast cancer Cousin   . Kidney failure Sister   . Thyroid disease Sister     Half sister and 2 half brother. 1 full brother. No Breast cancer in immediate family. Paternal cousin had breast cancer. Mother diagnosed with ovarian cancer at 1 years. Father passed from heart attack.   ALLERGIES:  is allergic to adhesive [tape]; amoxicillin; darvocet [propoxyphene n-acetaminophen]; doxycycline; erythromycin; and zithromax [azithromycin].  MEDICATIONS:  Current Outpatient Prescriptions  Medication Sig Dispense Refill  . ALPRAZolam (XANAX) 1 MG  tablet Take 1 mg by mouth 4 (four) times daily as needed for sleep.    [name] Kitchen aspirin 325 MG tablet Take 325 mg by mouth daily.    . Cholecalciferol (VITAMIN D) 2000 UNITS CAPS Take 2,000 Units by mouth daily.    . clindamycin (CLEOCIN) 300 MG capsule Take 1 capsule (300 mg total) by mouth 4 (four) times daily. 40 capsule 0  . Cyclophosphamide (CYTOXAN IJ) Inject as directed every 21 ( twenty-one) days. To start 10/03/16    . dexamethasone (DECADRON) 4 MG tablet The day before, day of, and day after chemo take 2 tablets in the am and 2 tablets in the pm. Take with food. 36 tablet 1  . Diphenhyd-Hydrocort-Nystatin (FIRST-DUKES MOUTHWASH) SUSP Use as directed 5 mLs in the mouth or throat 4 (four) times daily as needed. 300 mL 3  . DOCEtaxel (TAXOTERE IV) Inject into the  vein every 21 ( twenty-one) days. To start 10/03/16    . docusate sodium (COLACE) 50 MG capsule Take 100 mg by mouth 2 (two) times daily.    . fluconazole (DIFLUCAN) 100 MG tablet Take 1 tablet (100 mg total) by mouth daily. 10 tablet 1  . fluticasone (FLONASE) 50 MCG/ACT nasal spray Place 1 spray into both nostrils daily.    [name] Kitchen ipratropium (ATROVENT) 0.03 % nasal spray 1 spray daily.  4  . KRILL OIL PO Take 1 capsule by mouth daily.    [name] Kitchen levothyroxine (SYNTHROID, LEVOTHROID) 100 MCG tablet Take 100 mcg by mouth daily.  5  . lidocaine-prilocaine (EMLA) cream Apply a quarter size amount to port site 1 hour prior to chemo. Do not rub in. Cover with plastic wrap. 30 g 3  . meloxicam (MOBIC) 7.5 MG tablet   0  . montelukast (SINGULAIR) 10 MG tablet Take 10 mg by mouth daily.  11  . nystatin (MYCOSTATIN) 100000 UNIT/ML suspension   2  . ondansetron (ZOFRAN) 8 MG tablet Take 1 tablet (8 mg total) by mouth every 8 (eight) hours as needed for nausea or vomiting. 30 tablet 2  . Oxycodone HCl 10 MG TABS   0  . Pegfilgrastim (NEULASTA ONPRO ) Inject into the skin. To be administered 27 hours after the completion of chemo every 21 days.    .  potassium chloride SA (K-DUR,KLOR-CON) 20 MEQ tablet Take 2 tablets (40 mEq total) by mouth daily. 60 tablet 0  . prochlorperazine (COMPAZINE) 10 MG tablet Take 1 tablet (10 mg total) by mouth every 6 (six) hours as needed for nausea or vomiting. 30 tablet 2  . senna (SENOKOT) 8.6 MG TABS tablet Take 1 tablet by mouth at bedtime as needed.    . simvastatin (ZOCOR) 20 MG tablet Take 20 mg by mouth every evening.    . sulfamethoxazole-trimethoprim (BACTRIM DS,SEPTRA DS) 800-160 MG tablet   0  . venlafaxine (EFFEXOR) 75 MG tablet Take 75 mg by mouth 2 (two) times daily.    [name] Kitchen zolpidem (AMBIEN) 10 MG tablet Take 10 mg by mouth at bedtime.     No current facility-administered medications for this visit.     Review of Systems  Constitutional: Negative.   HENT: Negative.   Eyes: Negative.   Respiratory: Negative.   Cardiovascular: Positive for leg swelling.       Feet and leg swelling  Gastrointestinal: Positive for diarrhea (with treatment) and nausea (with treatment).  Genitourinary: Negative.   Musculoskeletal: Negative.   Skin:       Knot underneath left arm, this lesion busted and drained.  Neurological: Negative.   Endo/Heme/Allergies: Negative.   Psychiatric/Behavioral: Negative.   All other systems reviewed and are negative. 14 point ROS was done and is otherwise as detailed above or in HPI   PHYSICAL EXAMINATION: ECOG PERFORMANCE STATUS: 0 - Asymptomatic Vitals with BMI 10/24/2016  Height   Weight 251 lbs 13 oz  BMI   Systolic 470  Diastolic 67  Pulse 74  Respirations 18   Physical Exam  Constitutional: She is oriented to person, place, and time and well-developed, well-nourished, and in no distress.  HENT:  Head: Normocephalic and atraumatic.  Nose: Nose normal.  Mouth/Throat: Oropharynx is clear and moist. No oropharyngeal exudate.  Wearing hair prosthesis  Eyes: Conjunctivae and EOM are normal. Pupils are equal, round, and reactive to light. Right eye exhibits no  discharge. Left eye exhibits no discharge. No scleral icterus.  Neck: Normal  range of motion. Neck supple. No tracheal deviation present. No thyromegaly present.  Cardiovascular: Normal rate, regular rhythm and normal heart sounds.  Exam reveals no gallop and no friction rub.   No murmur heard. Pulmonary/Chest: Effort normal and breath sounds normal. She has no wheezes. She has no rales.  Abdominal: Soft. Bowel sounds are normal. She exhibits no distension and no mass. There is no tenderness. There is no rebound and no guarding.  Musculoskeletal: Normal range of motion. She exhibits no edema.  Lymphadenopathy:    She has no cervical adenopathy.  Neurological: She is alert and oriented to person, place, and time. She has normal reflexes. No cranial nerve deficit. Gait normal. Coordination normal.  Skin: Skin is warm and dry. No rash noted.  Previous left upper extremity abscess on the inner aspect of arm is gone  Psychiatric: Mood, memory, affect and judgment normal.  Nursing note and vitals reviewed.   LABORATORY DATA:  I have reviewed the data as listed Lab Results  Component Value Date   WBC 14.0 (H) 10/24/2016   HGB 13.1 10/24/2016   HCT 39.3 10/24/2016   MCV 99.2 10/24/2016   PLT 398 10/24/2016   CMP     Component Value Date/Time   NA 135 10/24/2016 0830   K 4.2 10/24/2016 0830   CL 102 10/24/2016 0830   CO2 27 10/24/2016 0830   GLUCOSE 132 (H) 10/24/2016 0830   BUN 14 10/24/2016 0830   CREATININE 0.74 10/24/2016 0830   CALCIUM 9.9 10/24/2016 0830   PROT 7.3 10/24/2016 0830   ALBUMIN 3.9 10/24/2016 0830   AST 23 10/24/2016 0830   ALT 24 10/24/2016 0830   ALKPHOS 103 10/24/2016 0830   BILITOT 0.4 10/24/2016 0830   GFRNONAA >60 10/24/2016 0830   GFRAA >60 10/24/2016 0830     RADIOGRAPHIC STUDIES: I have personally reviewed the radiological images as listed and agreed with the findings in the report. Study Result   CLINICAL DATA:  Left upper extremity  cellulitis.  EXAM: ULTRASOUND LEFT UPPER EXTREMITY LIMITED  TECHNIQUE: Ultrasound examination of the upper extremity soft tissues was performed in the area of clinical concern.  COMPARISON:  None  FINDINGS: There is a small inhomogeneous hypoechoic area in the subcutaneous fat in the area of concern measuring approximately 14 by 7 by 6 mm consistent with a small superficial subcutaneous abscess. No other abnormalities.  IMPRESSION: Small subcutaneous abscess measuring 14 x 7 x 6 mm in the area of concern.   Electronically Signed   By: Lorriane Shire M.D.   On: 10/19/2016 13:19     PATHOLOGY:       ASSESSMENT & PLAN:  L breast Cancer ER+ PR- HER 2 - Oncotype recurrence score 30 LUE abscess Chemotherapy induced diarrhea   She experienced severe nausea and diarrhea after cycle #1. Will add EMEND to her cycle today. I have dose reduced her taxotere by 10%. Hopefully she will do well with this cycle.   The patient is here for Cycle #2 TC. Will proceed with therapy today.   I encouraged the patient to use imodium to manage diarrhea. We reviewed the imodium sheet again today. She is also to contact our clinic if she experiences any adverse side effects with treatment.  She is scheduled to follow up on 11/14/2016 with her next cycle of treatment.  This document serves as a record of services personally performed by Ancil Linsey, MD. It was created on her behalf by Arlyce Harman, a trained medical  scribe. The creation of this record is based on the scribe's personal observations and the provider's statements to them. This document has been checked and approved by the attending provider.  I have reviewed the above documentation for accuracy and completeness and I agree with the above.  This note was electronically signed.    Molli Hazard, MD  10/24/2016 5:15 PM

## 2016-10-24 NOTE — Progress Notes (Signed)
Patient complained of headache/migraine, notified MD, gave medicine as ordered.  Chemotherapy given today per orders, patient tolerated it well,no issues. Neulasta on pro instructions given. Vitals stable and discharged from clinic ambulatory. Follow up as scheduled.

## 2016-10-24 NOTE — Patient Instructions (Addendum)
[name] at [facility] Discharge Instructions  RECOMMENDATIONS MADE BY THE CONSULTANT AND ANY TEST RESULTS WILL BE SENT TO YOUR REFERRING PHYSICIAN.  You were seen today by Dr. Whitney Muse Follow up in 3 weeks for labs treatment and appointment   Thank you for choosing [place] at [facility] to provide your oncology and hematology care.  To afford each patient quality time with our provider, please arrive at least 15 minutes before your scheduled appointment time.    If you have a lab appointment with the [facility] please come in thru the  Main Entrance and check in at the main information desk  You need to re-schedule your appointment should you arrive 10 or more minutes late.  We strive to give you quality time with our providers, and arriving late affects you and other patients whose appointments are after yours.  Also, if you no show three or more times for appointments you may be dismissed from the clinic at the providers discretion.     Again, thank you for choosing [facility].  Our hope is that these requests will decrease the amount of time that you wait before being seen by our physicians.       _____________________________________________________________  Should you have questions after your visit to [facility], please contact our office at [phone] between the hours of 8:30 a.m. and 4:30 p.m.  Voicemails left after 4:30 p.m. will not be returned until the following business day.  For prescription refill requests, have your pharmacy contact our office.       Resources For Cancer Patients and their Caregivers ? American Cancer Society: Can assist with transportation, wigs, general needs, runs Look Good Feel Better.        ([phone] ? Cancer Care: Provides financial assistance, online support groups, medication/co-pay assistance.  1-800-813-HOPE (818) 347-6943) ? [facility] Assists [place] cancer patients and their families through emotional , educational and financial support.  670-292-7559 ? [place] DSS Where to apply for food stamps, Medicaid and utility assistance. 260-048-6362 ? RCATS: Transportation to medical appointments. 406-677-4505 ? Social Security Administration: May apply for disability if have a Stage IV cancer. (503)797-0322 (207) 425-2086 ? [employer], Disability and Transit Services: Assists with nutrition, care and transit needs. [facility] Support Programs: @10RELATIVEDAYS @ > Cancer Support Group  2nd Tuesday of the month 1pm-2pm, Journey Room  > Creative Journey  3rd Tuesday of the month 1130am-1pm, Journey Room  > Look Good Feel Better  1st Wednesday of the month 10am-12 noon, Journey Room (Call [place] [facility] to register [phone])

## 2016-10-24 NOTE — Progress Notes (Signed)
[facility] Clinical Social Work  Clinical Social Work was referred by need to follow up with pt about resources to assist with financial support. Pt in better spirits today after nurses addressed her headache. Pt shared she had not had a chance to review materials/applications to date. She plans to review and reach back out to CSW for assistance. CSW encouraged pt to reach out to [employer] directly and explained application process.Pt had shared at last visit concerns about finances. CSW encouraged pt to review applications and phone CSW if she had questions. Pt is considering attending Support Group as well. She had a great friend with her and reports to be coping well.   Clinical Social Work interventions: Supportive Psychiatric nurse education and referral  [place] Anvitha Hutmacher, [place] Tuesdays   [phone]

## 2016-10-27 DIAGNOSIS — E876 Hypokalemia: Secondary | ICD-10-CM | POA: Diagnosis not present

## 2016-10-27 DIAGNOSIS — C50412 Malignant neoplasm of upper-outer quadrant of left female breast: Secondary | ICD-10-CM | POA: Diagnosis not present

## 2016-10-27 DIAGNOSIS — Z170 Estrogen receptor positive status [ER+]: Secondary | ICD-10-CM | POA: Diagnosis not present

## 2016-10-27 DIAGNOSIS — R110 Nausea: Secondary | ICD-10-CM | POA: Diagnosis not present

## 2016-10-27 DIAGNOSIS — L089 Local infection of the skin and subcutaneous tissue, unspecified: Secondary | ICD-10-CM | POA: Diagnosis not present

## 2016-10-27 DIAGNOSIS — R300 Dysuria: Secondary | ICD-10-CM | POA: Diagnosis not present

## 2016-10-27 DIAGNOSIS — B370 Candidal stomatitis: Secondary | ICD-10-CM | POA: Diagnosis not present

## 2016-10-31 ENCOUNTER — Ambulatory Visit (HOSPITAL_COMMUNITY)
Admission: RE | Admit: 2016-10-31 | Discharge: 2016-10-31 | Disposition: A | Discharge status: Home / Self Care | Payer: Medicare Other | Source: Ambulatory Visit | Attending: Oncology | Admitting: Oncology

## 2016-10-31 ENCOUNTER — Other Ambulatory Visit (HOSPITAL_COMMUNITY): Payer: Self-pay | Admitting: Oncology

## 2016-10-31 ENCOUNTER — Encounter (HOSPITAL_BASED_OUTPATIENT_CLINIC_OR_DEPARTMENT_OTHER): Payer: Medicare Other | Admitting: Oncology

## 2016-10-31 DIAGNOSIS — R2232 Localized swelling, mass and lump, left upper limb: Secondary | ICD-10-CM

## 2016-10-31 NOTE — Progress Notes (Signed)
Patient is seen as a work-in for a left arm nodule.  She reports that she recently noted it.  She reports shooting pain distally with left elbow extension.  She denies any fevers.  No edema.  No erythema.  PE: No vital taken. Left arm: in the anterior aspect of arm in the flexor surface measuring about 1 cm, mobile, minimal tenderness to palpation with overlying erythema, no heat.    Assessment: 1. Left anterior flexor surface nodule.  Plan: 1. [place] of left upper arm to evaluate for DVT versus abscess. 2. Return as scheduled.  Addendum: [place] is negative for any concerning issues.  Patient and plan discussed with Dr. Ancil Linsey and she is in agreement with the aforementioned.   Robynn Pane, PA-C 10/31/2016 3:41 PM

## 2016-11-01 ENCOUNTER — Telehealth (HOSPITAL_COMMUNITY): Payer: Self-pay | Admitting: Emergency Medicine

## 2016-11-01 NOTE — Telephone Encounter (Signed)
Late encounter- 10/31/2016 pt called and stated that her left arm was red and swollen.  Painful to touch.  Spoke with Dr Barbee Shropshire.  Pt to come in for nurse to look at it.  Kirby Crigler PA examined pt and we sent her for an ultrasound.

## 2016-11-13 DIAGNOSIS — M47816 Spondylosis without myelopathy or radiculopathy, lumbar region: Secondary | ICD-10-CM | POA: Diagnosis not present

## 2016-11-13 DIAGNOSIS — M47812 Spondylosis without myelopathy or radiculopathy, cervical region: Secondary | ICD-10-CM | POA: Diagnosis not present

## 2016-11-13 DIAGNOSIS — G894 Chronic pain syndrome: Secondary | ICD-10-CM | POA: Diagnosis not present

## 2016-11-13 DIAGNOSIS — M5416 Radiculopathy, lumbar region: Secondary | ICD-10-CM | POA: Diagnosis not present

## 2016-11-14 ENCOUNTER — Ambulatory Visit (HOSPITAL_COMMUNITY): Payer: Medicare Other | Admitting: Hematology & Oncology

## 2016-11-14 ENCOUNTER — Encounter (HOSPITAL_BASED_OUTPATIENT_CLINIC_OR_DEPARTMENT_OTHER): Payer: Medicare Other

## 2016-11-14 ENCOUNTER — Encounter (HOSPITAL_BASED_OUTPATIENT_CLINIC_OR_DEPARTMENT_OTHER): Payer: Medicare Other | Admitting: Adult Health

## 2016-11-14 ENCOUNTER — Other Ambulatory Visit (HOSPITAL_COMMUNITY): Payer: Self-pay | Admitting: Adult Health

## 2016-11-14 ENCOUNTER — Encounter: Payer: Self-pay | Admitting: *Deleted

## 2016-11-14 ENCOUNTER — Encounter (HOSPITAL_COMMUNITY): Payer: Self-pay | Admitting: Adult Health

## 2016-11-14 VITALS — BP 131/64 | HR 66 | Temp 98.7°F | Resp 18 | Wt 256.0 lb

## 2016-11-14 DIAGNOSIS — C50412 Malignant neoplasm of upper-outer quadrant of left female breast: Secondary | ICD-10-CM

## 2016-11-14 DIAGNOSIS — G43809 Other migraine, not intractable, without status migrainosus: Secondary | ICD-10-CM

## 2016-11-14 DIAGNOSIS — Z170 Estrogen receptor positive status [ER+]: Principal | ICD-10-CM

## 2016-11-14 DIAGNOSIS — K521 Toxic gastroenteritis and colitis: Secondary | ICD-10-CM | POA: Diagnosis not present

## 2016-11-14 DIAGNOSIS — Z5189 Encounter for other specified aftercare: Secondary | ICD-10-CM

## 2016-11-14 DIAGNOSIS — G43919 Migraine, unspecified, intractable, without status migrainosus: Secondary | ICD-10-CM

## 2016-11-14 DIAGNOSIS — Z5111 Encounter for antineoplastic chemotherapy: Secondary | ICD-10-CM

## 2016-11-14 LAB — COMPREHENSIVE METABOLIC PANEL
ALT: 20 U/L (ref 14–54)
AST: 19 U/L (ref 15–41)
Albumin: 3.7 g/dL (ref 3.5–5.0)
Alkaline Phosphatase: 65 U/L (ref 38–126)
Anion gap: 7 (ref 5–15)
BUN: 14 mg/dL (ref 6–20)
CO2: 25 mmol/L (ref 22–32)
CREATININE: 0.69 mg/dL (ref 0.44–1.00)
Calcium: 9.6 mg/dL (ref 8.9–10.3)
Chloride: 105 mmol/L (ref 101–111)
Glucose, Bld: 138 mg/dL — ABNORMAL HIGH (ref 65–99)
Potassium: 4.1 mmol/L (ref 3.5–5.1)
Sodium: 137 mmol/L (ref 135–145)
Total Bilirubin: 0.4 mg/dL (ref 0.3–1.2)
Total Protein: 6.4 g/dL — ABNORMAL LOW (ref 6.5–8.1)

## 2016-11-14 LAB — CBC WITH DIFFERENTIAL/PLATELET
Basophils Absolute: 0 10*3/uL (ref 0.0–0.1)
Basophils Relative: 0 %
EOS ABS: 0 10*3/uL (ref 0.0–0.7)
Eosinophils Relative: 0 %
HCT: 36.4 % (ref 36.0–46.0)
HEMOGLOBIN: 12.1 g/dL (ref 12.0–15.0)
LYMPHS ABS: 1.2 10*3/uL (ref 0.7–4.0)
LYMPHS PCT: 7 %
MCH: 32.9 pg (ref 26.0–34.0)
MCHC: 33.2 g/dL (ref 30.0–36.0)
MCV: 98.9 fL (ref 78.0–100.0)
MONOS PCT: 5 %
Monocytes Absolute: 0.9 10*3/uL (ref 0.1–1.0)
NEUTROS ABS: 15.9 10*3/uL — AB (ref 1.7–7.7)
NEUTROS PCT: 88 %
Platelets: 234 10*3/uL (ref 150–400)
RBC: 3.68 MIL/uL — AB (ref 3.87–5.11)
RDW: 14 % (ref 11.5–15.5)
WBC: 18.1 10*3/uL — AB (ref 4.0–10.5)

## 2016-11-14 MED ORDER — PEGFILGRASTIM 6 MG/0.6ML ~~LOC~~ PSKT
PREFILLED_SYRINGE | SUBCUTANEOUS | Status: AC
  Filled 2016-11-14: qty 0.6

## 2016-11-14 MED ORDER — PALONOSETRON HCL INJECTION 0.25 MG/5ML
0.2500 mg | Freq: Once | INTRAVENOUS | Status: AC
  Administered 2016-11-14: 0.25 mg via INTRAVENOUS

## 2016-11-14 MED ORDER — SODIUM CHLORIDE 0.9 % IV SOLN
Freq: Once | INTRAVENOUS | Status: AC
  Administered 2016-11-14: 10:00:00 via INTRAVENOUS

## 2016-11-14 MED ORDER — PEGFILGRASTIM 6 MG/0.6ML ~~LOC~~ PSKT
6.0000 mg | PREFILLED_SYRINGE | Freq: Once | SUBCUTANEOUS | Status: AC
  Administered 2016-11-14: 6 mg via SUBCUTANEOUS

## 2016-11-14 MED ORDER — SODIUM CHLORIDE 0.9 % IV SOLN
Freq: Once | INTRAVENOUS | Status: AC
  Administered 2016-11-14: 11:00:00 via INTRAVENOUS
  Filled 2016-11-14: qty 5

## 2016-11-14 MED ORDER — BUTALBITAL-APAP-CAFFEINE 50-325-40 MG PO TABS
1.0000 | ORAL_TABLET | ORAL | Status: AC
  Administered 2016-11-14: 1 via ORAL

## 2016-11-14 MED ORDER — PALONOSETRON HCL INJECTION 0.25 MG/5ML
INTRAVENOUS | Status: AC
  Filled 2016-11-14: qty 5

## 2016-11-14 MED ORDER — BUTALBITAL-APAP-CAFFEINE 50-325-40 MG PO TABS
1.0000 | ORAL_TABLET | ORAL | Status: AC
  Administered 2016-11-14: 1 via ORAL
  Filled 2016-11-14: qty 1

## 2016-11-14 MED ORDER — SODIUM CHLORIDE 0.9 % IV SOLN
600.0000 mg/m2 | Freq: Once | INTRAVENOUS | Status: AC
  Administered 2016-11-14: 1380 mg via INTRAVENOUS
  Filled 2016-11-14: qty 50

## 2016-11-14 MED ORDER — DOCETAXEL CHEMO INJECTION 160 MG/16ML
65.0000 mg/m2 | Freq: Once | INTRAVENOUS | Status: AC
  Administered 2016-11-14: 150 mg via INTRAVENOUS
  Filled 2016-11-14: qty 15

## 2016-11-14 MED ORDER — HEPARIN SOD (PORK) LOCK FLUSH 100 UNIT/ML IV SOLN
500.0000 [IU] | Freq: Once | INTRAVENOUS | Status: AC | PRN
  Administered 2016-11-14: 500 [IU]
  Filled 2016-11-14: qty 5

## 2016-11-14 MED ORDER — BUTALBITAL-APAP-CAFFEINE 50-325-40 MG PO TABS
1.0000 | ORAL_TABLET | ORAL | Status: DC
  Filled 2016-11-14: qty 1

## 2016-11-14 NOTE — Progress Notes (Signed)
[place] Clinical Social Work  Clinical Social Work was referred by rounding in the treatment area. Clinical Social Worker met with patient at [facility] to offer support and re-assess for needs.  Pt in good spirits today, but shared ongoing financial concerns. CSW reviewed with pt options for assistance and how to access. CSW reviewed applications for [place] that CSW had given to pt several weeks ago. CSW strongly encouraged pt to gather needed documents and bring to next appt and CSW would work with her on the applications. Pt stated understanding and requested her friend to remind her. CSW also encouraged pt to go to [employer] to apply for assistance as well. CSW to follow and see pt next week.     Clinical Social Work interventions: Supportive listening Resource education  Loren Racer, [place] Tuesdays   [phone]

## 2016-11-14 NOTE — Progress Notes (Signed)
Tolerated chemo well. [name] KitchenGentry Fitz [facility] today for [facility] neulasta on body injector. See MAR for administration details. Injector in place and engaged with green light indicator on flashing. Tolerated application with out problems. Stable and ambulatory on discharge home with friend.

## 2016-11-14 NOTE — Patient Instructions (Addendum)
[place] at [facility] Discharge Instructions  RECOMMENDATIONS MADE BY THE CONSULTANT AND ANY TEST RESULTS WILL BE SENT TO YOUR REFERRING PHYSICIAN.  Exam with Mike Craze, NP today.   Labs today. Treatment today.  Return to the clinic in 3 weeks for treatment and to see the provider for follow up.   Angie will look into the issue with the insurance approving the Neulasta.  We will let you know what we find out.   Please see Amy as you leave for appointments.    Thank you for choosing [place] at [facility] to provide your oncology and hematology care.  To afford each patient quality time with our provider, please arrive at least 15 minutes before your scheduled appointment time.    If you have a lab appointment with the [facility] please come in thru the  Main Entrance and check in at the main information desk  You need to re-schedule your appointment should you arrive 10 or more minutes late.  We strive to give you quality time with our providers, and arriving late affects you and other patients whose appointments are after yours.  Also, if you no show three or more times for appointments you may be dismissed from the clinic at the providers discretion.     Again, thank you for choosing [facility].  Our hope is that these requests will decrease the amount of time that you wait before being seen by our physicians.       _____________________________________________________________  Should you have questions after your visit to [facility], please contact our office at [phone] between the hours of 8:30 a.m. and 4:30 p.m.  Voicemails left after 4:30 p.m. will not be returned until the following business day.  For prescription refill requests, have your pharmacy contact our office.       Resources For Cancer Patients and their Caregivers ? American Cancer Society: Can assist with transportation,  wigs, general needs, runs Look Good Feel Better.        7203058837 ? Cancer Care: Provides financial assistance, online support groups, medication/co-pay assistance.  1-800-813-HOPE 626-335-2879) ? [facility] Assists [place] cancer patients and their families through emotional , educational and financial support.  913-522-7208 ? [place] DSS Where to apply for food stamps, Medicaid and utility assistance. 270-346-2952 ? RCATS: Transportation to medical appointments. (281) 380-0588 ? Social Security Administration: May apply for disability if have a Stage IV cancer. 256-737-6877 [phone])[phone] ? [employer], Disability and Transit Services: Assists with nutrition, care and transit needs. [place] Support Programs: @10RELATIVEDAYS @ > Cancer Support Group  2nd Tuesday of the month 1pm-2pm, Journey Room  > Creative Journey  3rd Tuesday of the month 1130am-1pm, Journey Room  > Look Good Feel Better  1st Wednesday of the month 10am-12 noon, Journey Room (Call [place] to register [phone])

## 2016-11-14 NOTE — Patient Instructions (Signed)
[facility] Discharge Instructions for Patients Receiving Chemotherapy   Beginning January 23rd 2017 lab work for the [facility] will be done in the  Main lab at [facility] on 1st floor. If you have a lab appointment with the [facility] please come in thru the  Main Entrance and check in at the main information desk   Today you received the following chemotherapy agents taxotere and cytoxan.  To help prevent nausea and vomiting after your treatment, we encourage you to take your nausea medication as instructed.  Neulasta OnPro will dispense medication between 430 pm and 530 pm tomorrow. You may remove device after 530 pm tomorrow.   If you develop nausea and vomiting, or diarrhea that is not controlled by your medication, call the clinic.  The clinic phone number is [phone]. Office hours are Monday-Friday 8:30am-5:00pm.  BELOW ARE SYMPTOMS THAT SHOULD BE REPORTED IMMEDIATELY:  *FEVER GREATER THAN 101.0 F  *CHILLS WITH OR WITHOUT FEVER  NAUSEA AND VOMITING THAT IS NOT CONTROLLED WITH YOUR NAUSEA MEDICATION  *UNUSUAL SHORTNESS OF BREATH  *UNUSUAL BRUISING OR BLEEDING  TENDERNESS IN MOUTH AND THROAT WITH OR WITHOUT PRESENCE OF ULCERS  *URINARY PROBLEMS  *BOWEL PROBLEMS  UNUSUAL RASH Items with * indicate a potential emergency and should be followed up as soon as possible. If you have an emergency after office hours please contact your primary care physician or go to the nearest emergency department.  Please call the clinic during office hours if you have any questions or concerns.   You may also contact the Patient Navigator at [phone] should you have any questions or need assistance in obtaining follow up care.      Resources For Cancer Patients and their Caregivers ? American Cancer Society: Can assist with transportation, wigs, general needs, runs Look Good Feel Better.        236-623-7741 ? Cancer Care: Provides financial  assistance, online support groups, medication/co-pay assistance.  1-800-813-HOPE 872-247-0303) ? [facility] Assists [place] cancer patients and their families through emotional , educational and financial support.  (314) 290-6723 ? [place] DSS Where to apply for food stamps, Medicaid and utility assistance. 431-650-9295 ? [facility]: Transportation to medical appointments. 504-568-9541 ? Social Security Administration: May apply for disability if have a Stage IV cancer. (308) 754-0455 (206)468-9829 ? [employer], Disability and Transit Services: Assists with nutrition, care and transit needs. 434-041-0610

## 2016-11-15 NOTE — Progress Notes (Signed)
[facility]:  Medical Oncology/Hematology   PCP:  Patient Care Team: Redmond [employer], MD as PCP - General (Internal Medicine)  REASON FOR VISIT:  -Stage IA left breast invasive ductal carcinoma, ER+, PR-, HER2-  CURRENT THERAPY:  Cycle #3 of Taxotere/Cytoxan due today     Breast cancer of upper-outer quadrant of left female breast ([place])   07/26/2016 Mammogram    Area of developing asymmetry with distortion located within the upper-outer quadrant left breast. Tissue sampling via tomosynthesis guided biopsy is recommended and will be scheduled.  RECOMMENDATION: Left breast tomosynthesis guided biopsy. This has been scheduled for 07/28/2016.       07/28/2016 Initial Biopsy    Coil shaped clip corresponds to the asymmetry in the outer left breast. Clip is well positioned at the biopsy site. 2. X shaped clip corresponds to the ASYMMETRY/DISTORTION in the upper-outer quadrant of the left breast. Clip is positioned approximately 1 cm medial to the center of the biopsy cavity      07/28/2016 Pathology Results    Estrogen Receptor: 80%, POSITIVE, MODERATE STAINING INTENSITY Progesterone Receptor: 0%, NEGATIVE Proliferation Marker Ki67: 70% HER 2 negative by FISH Breast, left, needle core biopsy, upper outer quadrant - INVASIVE DUCTAL CARCINOMA, SEE COMMENT. - HIGH GRADE DUCTAL CARCINOMA IN SITU WITH NECROSIS.      08/24/2016 Oncotype testing    Recurrence Score Result 30, 10 year risk of distant recurrence Tamoxifen alone 20%      10/03/2016 -  Chemotherapy    The patient had palonosetron (ALOXI) injection 0.25 mg, 0.25 mg, Intravenous,  Once, 2 of 4 cycles Administration: 0.25 mg (10/24/2016)  pegfilgrastim (NEULASTA ONPRO KIT) injection 6 mg, 6 mg, Subcutaneous, Once, 2 of 4 cycles Administration: 6 mg (10/24/2016)  cyclophosphamide (CYTOXAN) 1,380 mg in sodium chloride 0.9 % 250 mL chemo infusion, 600 mg/m2 = 1,380 mg, Intravenous,  Once, 2 of 4  cycles Administration: 1,380 mg (10/24/2016)  DOCEtaxel (TAXOTERE) 170 mg in dextrose 5 % 250 mL chemo infusion, 75 mg/m2 = 170 mg, Intravenous,  Once, 2 of 4 cycles Dose modification: 65 mg/m2 (original dose 75 mg/m2, Cycle 2, Reason: Dose not tolerated)  fosaprepitant (EMEND) 150 mg, dexamethasone (DECADRON) 12 mg in sodium chloride 0.9 % 145 mL IVPB, , Intravenous,  Once, 1 of 3 cycles Administration:  (10/24/2016)  for chemotherapy treatment.          HISTORY OF PRESENTING ILLNESS:  Tracy Valentine 61 y.o. female is here for follow up & consideration for next cycle of adjuvant chemotherapy with Taxotere/Cytoxan of left breast cancer ER+/PR-/HER2-. Ki67 is 70%.   Tracy Valentine returns to the [facility] today accompanied by her best friend and is seen in treatment area.  She is here for cycle #3 Taxotere/Cytoxan.   She reports having a severe headache, which started yesterday and is giving her a bit of nausea at present.  She has had these types of headaches in the past and are consistent with her migraines.  She asks if she can receive "the shot Dr. Whitney Muse gave me last time."    She has intermittent nausea after her chemo treatments; Emend was added to treatment regimen.  She denies any vomiting.  Her bowels are moving well. Denies peripheral neuropathy.  Her appetite and energy levels are good on her "off weeks" of chemotherapy.   She does have intermittent left upper extremity arm pain; she had an unplanned visit at the cancer center on 10/31/16 where she  was seen for left upper arm tenderness and erythema. U/S done and negative for DVT.  She continues to have some intermittent "pulling" and tenderness.  Erythema has resolved.    Her bowels are moving well; she has some diarrhea after treatment. Denies fever, chills, cough, shortness of breath.     MEDICAL HISTORY:  Past Medical History:  Diagnosis Date  . Anxiety   . Breast cancer (HCC)    breast  . Chronic pain   . DDD  (degenerative disc disease), cervical   . DDD (degenerative disc disease), lumbar   . Degenerative disc disease at L5-S1 level   . Depression   . GERD (gastroesophageal reflux disease)   . History of kidney stones   . Hyperlipidemia   . Hypothyroidism   . Obesity   . Osteoarthritis   . PONV (postoperative nausea and vomiting)   . Sleep apnea    uses CPAP  . Spinal stenosis   . Spinal stenosis     SURGICAL HISTORY: Past Surgical History:  Procedure Laterality Date  . ABDOMINAL HYSTERECTOMY     partial hyst, still have ovaries  . BREAST LUMPECTOMY WITH RADIOACTIVE SEED AND SENTINEL LYMPH NODE BIOPSY Left 08/24/2016   Procedure: LEFT BREAST PARTIAL MASTECTOMY WITH RADIOACTIVE SEED AND LEFT SENTINEL LYMPH NODE MAPPING;  Surgeon: Erroll Luna, MD;  Location: [facility];  Service: General;  Laterality: Left;  . CHOLECYSTECTOMY    . FOOT FOREIGN BODY REMOVAL Left   . PORTACATH PLACEMENT Right 09/29/2016   Procedure: INSERTION PORT-A-CATH RIGHT SUBCLAVIAN;  Surgeon: Aviva Signs, MD;  Location: [facility];  Service: General;  Laterality: Right;    SOCIAL HISTORY: Social History   Social History  . Marital status: Married    Spouse name: N/A  . Number of children: N/A  . Years of education: N/A   Occupational History  . Not on file.   Social History Main Topics  . Smoking status: Never Smoker  . Smokeless tobacco: Never Used  . Alcohol use No  . Drug use: No  . Sexual activity: Yes    Birth control/ protection: None     Comment: married-1 grown son   Other Topics Concern  . Not on file   Social History Narrative  . No narrative on file  1 child; No grandchildren  Never smoked No alcohol Has pets (cat and three dogs)  FAMILY HISTORY: Family History  Problem Relation Age of Onset  . COPD Mother   . Heart attack Father   . Diabetes Sister   . Heart disease Sister   . Kidney cancer Brother   . Breast cancer Cousin   . Kidney failure Sister   .  Thyroid disease Sister     Half sister and 2 half brother. 1 full brother. No Breast cancer in immediate family. Paternal cousin had breast cancer. Mother diagnosed with ovarian cancer at 106 years. Father passed from heart attack.   ALLERGIES:   Allergies  Allergen Reactions  . Adhesive [Tape]     Some adhesives cause rash, redness, blister (not all)  . Amoxicillin Hives  . Darvocet [Propoxyphene N-Acetaminophen]   . Doxycycline Hives  . Erythromycin Nausea And Vomiting    Severe GI issues  . Zithromax [Azithromycin] Other (See Comments)    Vomiting, diarrhea     MEDICATIONS:  Current Outpatient Prescriptions  Medication Sig Dispense Refill  . ALPRAZolam (XANAX) 1 MG tablet Take 1 mg by mouth 4 (four) times daily as needed for sleep.    [name] Kitchen  aspirin 325 MG tablet Take 325 mg by mouth daily.    . Cholecalciferol (VITAMIN D) 2000 UNITS CAPS Take 2,000 Units by mouth daily.    . clindamycin (CLEOCIN) 300 MG capsule Take 1 capsule (300 mg total) by mouth 4 (four) times daily. (Patient not taking: Reported on 11/14/2016) 40 capsule 0  . Cyclophosphamide (CYTOXAN IJ) Inject as directed every 21 ( twenty-one) days. To start 10/03/16    . dexamethasone (DECADRON) 4 MG tablet The day before, day of, and day after chemo take 2 tablets in the am and 2 tablets in the pm. Take with food. 36 tablet 1  . Diphenhyd-Hydrocort-Nystatin (FIRST-DUKES MOUTHWASH) SUSP Use as directed 5 mLs in the mouth or throat 4 (four) times daily as needed. 300 mL 3  . DOCEtaxel (TAXOTERE IV) Inject into the vein every 21 ( twenty-one) days. To start 10/03/16    . docusate sodium (COLACE) 50 MG capsule Take 100 mg by mouth 2 (two) times daily.    . fluconazole (DIFLUCAN) 100 MG tablet Take 1 tablet (100 mg total) by mouth daily. (Patient not taking: Reported on 11/14/2016) 10 tablet 1  . fluticasone (FLONASE) 50 MCG/ACT nasal spray Place 1 spray into both nostrils daily.    [name] Kitchen ipratropium (ATROVENT) 0.03 % nasal spray 1  spray daily.  4  . KRILL OIL PO Take 1 capsule by mouth daily.    [name] Kitchen levothyroxine (SYNTHROID, LEVOTHROID) 100 MCG tablet Take 100 mcg by mouth daily.  5  . lidocaine-prilocaine (EMLA) cream Apply a quarter size amount to port site 1 hour prior to chemo. Do not rub in. Cover with plastic wrap. 30 g 3  . meloxicam (MOBIC) 7.5 MG tablet   0  . montelukast (SINGULAIR) 10 MG tablet Take 10 mg by mouth daily.  11  . nystatin (MYCOSTATIN) 100000 UNIT/ML suspension   2  . ondansetron (ZOFRAN) 8 MG tablet Take 1 tablet (8 mg total) by mouth every 8 (eight) hours as needed for nausea or vomiting. 30 tablet 2  . Oxycodone HCl 10 MG TABS   0  . Pegfilgrastim (NEULASTA ONPRO [place]) Inject into the skin. To be administered 27 hours after the completion of chemo every 21 days.    . potassium chloride SA (K-DUR,KLOR-CON) 20 MEQ tablet Take 2 tablets (40 mEq total) by mouth daily. 60 tablet 0  . prochlorperazine (COMPAZINE) 10 MG tablet Take 1 tablet (10 mg total) by mouth every 6 (six) hours as needed for nausea or vomiting. 30 tablet 2  . senna (SENOKOT) 8.6 MG TABS tablet Take 1 tablet by mouth at bedtime as needed.    . simvastatin (ZOCOR) 20 MG tablet Take 20 mg by mouth every evening.    . sulfamethoxazole-trimethoprim (BACTRIM DS,SEPTRA DS) 800-160 MG tablet   0  . venlafaxine (EFFEXOR) 75 MG tablet Take 75 mg by mouth 2 (two) times daily.    [name] Kitchen zolpidem (AMBIEN) 10 MG tablet Take 10 mg by mouth at bedtime.     No current facility-administered medications for this visit.     REVIEW OF SYSTEMS:  Review of Systems  Constitutional: Negative.   HENT: Negative.   Eyes: Negative.   Respiratory: Negative.   Cardiovascular: Positive for leg swelling (intermittent).       Feet and leg swelling, intermittently   Gastrointestinal: Positive for diarrhea (with treatment ) and nausea (with treatment).  Genitourinary: Negative.   Musculoskeletal: Negative.   Skin: Negative.   Neurological: Negative.     Endo/Heme/Allergies:  Negative.   Psychiatric/Behavioral: Negative.   All other systems reviewed and are negative. 14 point ROS was done and is otherwise as detailed above or in HPI   PHYSICAL EXAMINATION: ECOG PERFORMANCE STATUS: 0 - Asymptomatic Vitals with BMI 10/24/2016  Height   Weight 251 lbs 13 oz  BMI   Systolic 094  Diastolic 67  Pulse 74  Respirations 18   Physical Exam  Constitutional: She is oriented to person, place, and time and well-developed, well-nourished, and in no distress.  HENT:  Head: Normocephalic and atraumatic.  Nose: Nose normal.  Mouth/Throat: Oropharynx is clear and moist. No oropharyngeal exudate.  Wearing hair prosthesis  Eyes: Conjunctivae and EOM are normal. Pupils are equal, round, and reactive to light. Right eye exhibits no discharge. Left eye exhibits no discharge. No scleral icterus.  Neck: Normal range of motion. Neck supple. No tracheal deviation present. No thyromegaly present.  Cardiovascular: Normal rate, regular rhythm and normal heart sounds.  Exam reveals no gallop and no friction rub.   No murmur heard. Pulmonary/Chest: Effort normal and breath sounds normal. She has no wheezes. She has no rales.  Abdominal: Soft. Bowel sounds are normal. She exhibits no distension and no mass. There is no tenderness. There is no rebound and no guarding.  Musculoskeletal: Normal range of motion. She exhibits no edema.  Lymphadenopathy:    She has no cervical adenopathy.  Neurological: She is alert and oriented to person, place, and time. No cranial nerve deficit. Gait normal. Coordination normal.  Skin: Skin is warm and dry. No rash noted.  Previous reported erythema to left upper extremity is resolved; minor residual tenderness  Psychiatric: Mood, memory, affect and judgment normal.  Nursing note and vitals reviewed.   LABORATORY DATA:  I have reviewed the data as listed Lab Results  Component Value Date   WBC 18.1 (H) 11/14/2016   HGB 12.1  11/14/2016   HCT 36.4 11/14/2016   MCV 98.9 11/14/2016   PLT 234 11/14/2016   CMP     Component Value Date/Time   NA 137 11/14/2016 0941   K 4.1 11/14/2016 0941   CL 105 11/14/2016 0941   CO2 25 11/14/2016 0941   GLUCOSE 138 (H) 11/14/2016 0941   BUN 14 11/14/2016 0941   CREATININE 0.69 11/14/2016 0941   CALCIUM 9.6 11/14/2016 0941   PROT 6.4 (L) 11/14/2016 0941   ALBUMIN 3.7 11/14/2016 0941   AST 19 11/14/2016 0941   ALT 20 11/14/2016 0941   ALKPHOS 65 11/14/2016 0941   BILITOT 0.4 11/14/2016 0941   GFRNONAA >60 11/14/2016 0941   GFRAA >60 11/14/2016 0941     RADIOGRAPHIC STUDIES: I have personally reviewed the radiological images as listed and agreed with the findings in the report. Study Result   CLINICAL DATA:  Left upper extremity cellulitis.  EXAM: ULTRASOUND LEFT UPPER EXTREMITY LIMITED  TECHNIQUE: Ultrasound examination of the upper extremity soft tissues was performed in the area of clinical concern.  COMPARISON:  None  FINDINGS: There is a small inhomogeneous hypoechoic area in the subcutaneous fat in the area of concern measuring approximately 14 by 7 by 6 mm consistent with a small superficial subcutaneous abscess. No other abnormalities.  IMPRESSION: Small subcutaneous abscess measuring 14 x 7 x 6 mm in the area of concern.   Electronically Signed   By: Lorriane Shire M.D.   On: 10/19/2016 13:19     PATHOLOGY:       ASSESSMENT & PLAN:  L breast Cancer ER+  PR- HER 2 - Oncotype recurrence score 30 LUE abscess Chemotherapy induced diarrhea   Stage IA left breast cancer:  -Her labs are adequate to proceed with cycle #3 of Taxotere/Cytoxan today with OnPro support.  There were concerns about her insurance coverage of the OnPro, but our Patient Financial Advocate helped address this for the patient today (the drug is covered; application by RN is not covered).  Angie working with patient on this.  We discussed that she will get  1 more cycle of chemotherapy, then she will be referred to radiation oncologist for adjuvant breast radiation. She has already had her consultation with the radiation oncologist; we briefly discussed the CT sim process, possible side effects of radiation, and general course of radiation therapy for breast cancer.  We also discussed that she will need adjuvant anti-estrogen therapy after the completion of radiation. She will return to cancer center in 3 weeks for her 4th and final cycle of chemotherapy.    Headache, likely migraine:  -She previously received IV Morphine and IV Toradol for previous headache.  I prefer to start with oral agents.  I will prescribe for her to get 1 tab of Fiorocet now, with orders to repeat in 1 hour if no relief.  Nursing will let me know if this is inadequate to help with her headache.    Diarrhea, secondary to chemotherapy:  -Managed with Imodium; encouraged her to continue to take as needed.    Dispo:  -Return to cancer center in 3 weeks for Cycle #4 Taxotere/Cytoxan.    Plan of care discussed with Dr. Ancil Linsey, who agrees with the above aforementioned.   Mike Craze, NP [facility] [phone]

## 2016-11-20 ENCOUNTER — Encounter (HOSPITAL_COMMUNITY): Payer: Self-pay | Admitting: Hematology & Oncology

## 2016-12-05 ENCOUNTER — Encounter: Payer: Self-pay | Admitting: *Deleted

## 2016-12-05 ENCOUNTER — Encounter (HOSPITAL_BASED_OUTPATIENT_CLINIC_OR_DEPARTMENT_OTHER): Payer: Medicare Other | Admitting: Oncology

## 2016-12-05 ENCOUNTER — Encounter (HOSPITAL_COMMUNITY): Payer: Medicare Other | Attending: Hematology & Oncology

## 2016-12-05 VITALS — BP 128/65 | HR 75 | Temp 98.2°F | Resp 18

## 2016-12-05 DIAGNOSIS — E876 Hypokalemia: Secondary | ICD-10-CM | POA: Insufficient documentation

## 2016-12-05 DIAGNOSIS — Z5189 Encounter for other specified aftercare: Secondary | ICD-10-CM | POA: Diagnosis not present

## 2016-12-05 DIAGNOSIS — R300 Dysuria: Secondary | ICD-10-CM | POA: Diagnosis not present

## 2016-12-05 DIAGNOSIS — R110 Nausea: Secondary | ICD-10-CM | POA: Insufficient documentation

## 2016-12-05 DIAGNOSIS — Z170 Estrogen receptor positive status [ER+]: Secondary | ICD-10-CM | POA: Insufficient documentation

## 2016-12-05 DIAGNOSIS — C50412 Malignant neoplasm of upper-outer quadrant of left female breast: Secondary | ICD-10-CM | POA: Diagnosis not present

## 2016-12-05 DIAGNOSIS — Z5111 Encounter for antineoplastic chemotherapy: Secondary | ICD-10-CM

## 2016-12-05 DIAGNOSIS — B370 Candidal stomatitis: Secondary | ICD-10-CM | POA: Diagnosis not present

## 2016-12-05 DIAGNOSIS — L089 Local infection of the skin and subcutaneous tissue, unspecified: Secondary | ICD-10-CM | POA: Insufficient documentation

## 2016-12-05 LAB — CBC WITH DIFFERENTIAL/PLATELET
BASOS PCT: 0 %
Basophils Absolute: 0 10*3/uL (ref 0.0–0.1)
EOS ABS: 0 10*3/uL (ref 0.0–0.7)
Eosinophils Relative: 0 %
HCT: 36.6 % (ref 36.0–46.0)
Hemoglobin: 12.1 g/dL (ref 12.0–15.0)
Lymphocytes Relative: 8 %
Lymphs Abs: 1.3 10*3/uL (ref 0.7–4.0)
MCH: 33.1 pg (ref 26.0–34.0)
MCHC: 33.1 g/dL (ref 30.0–36.0)
MCV: 100 fL (ref 78.0–100.0)
MONOS PCT: 4 %
Monocytes Absolute: 0.6 10*3/uL (ref 0.1–1.0)
NEUTROS PCT: 88 %
Neutro Abs: 14.7 10*3/uL — ABNORMAL HIGH (ref 1.7–7.7)
PLATELETS: 275 10*3/uL (ref 150–400)
RBC: 3.66 MIL/uL — ABNORMAL LOW (ref 3.87–5.11)
RDW: 14.3 % (ref 11.5–15.5)
WBC: 16.5 10*3/uL — ABNORMAL HIGH (ref 4.0–10.5)

## 2016-12-05 LAB — COMPREHENSIVE METABOLIC PANEL
ALK PHOS: 69 U/L (ref 38–126)
ALT: 18 U/L (ref 14–54)
AST: 20 U/L (ref 15–41)
Albumin: 3.8 g/dL (ref 3.5–5.0)
Anion gap: 9 (ref 5–15)
BUN: 12 mg/dL (ref 6–20)
CALCIUM: 10.2 mg/dL (ref 8.9–10.3)
CO2: 27 mmol/L (ref 22–32)
CREATININE: 0.78 mg/dL (ref 0.44–1.00)
Chloride: 102 mmol/L (ref 101–111)
GFR calc non Af Amer: >60 mL/min (ref >60)
GLUCOSE: 149 mg/dL — AB (ref 65–99)
Potassium: 3.8 mmol/L (ref 3.5–5.1)
SODIUM: 138 mmol/L (ref 135–145)
Total Bilirubin: 0.5 mg/dL (ref 0.3–1.2)
Total Protein: 6.7 g/dL (ref 6.5–8.1)

## 2016-12-05 MED ORDER — PROCHLORPERAZINE MALEATE 10 MG PO TABS: 10.0000 mg | ORAL_TABLET | Freq: Four times a day (QID) | ORAL | 2 refills | Status: DC | PRN

## 2016-12-05 MED ORDER — PEGFILGRASTIM 6 MG/0.6ML ~~LOC~~ PSKT
6.0000 mg | PREFILLED_SYRINGE | Freq: Once | SUBCUTANEOUS | Status: AC
  Administered 2016-12-05: 6 mg via SUBCUTANEOUS
  Filled 2016-12-05: qty 0.6

## 2016-12-05 MED ORDER — DOCETAXEL CHEMO INJECTION 160 MG/16ML
65.0000 mg/m2 | Freq: Once | INTRAVENOUS | Status: AC
  Administered 2016-12-05: 150 mg via INTRAVENOUS
  Filled 2016-12-05: qty 15

## 2016-12-05 MED ORDER — HEPARIN SOD (PORK) LOCK FLUSH 100 UNIT/ML IV SOLN
500.0000 [IU] | Freq: Once | INTRAVENOUS | Status: AC | PRN
  Administered 2016-12-05: 500 [IU]
  Filled 2016-12-05: qty 5

## 2016-12-05 MED ORDER — SODIUM CHLORIDE 0.9 % IV SOLN
Freq: Once | INTRAVENOUS | Status: AC
  Administered 2016-12-05: 10:00:00 via INTRAVENOUS
  Filled 2016-12-05: qty 5

## 2016-12-05 MED ORDER — SODIUM CHLORIDE 0.9 % IV SOLN
600.0000 mg/m2 | Freq: Once | INTRAVENOUS | Status: AC
  Administered 2016-12-05: 1380 mg via INTRAVENOUS
  Filled 2016-12-05: qty 50

## 2016-12-05 MED ORDER — ONDANSETRON HCL 8 MG PO TABS: 8.0000 mg | ORAL_TABLET | Freq: Three times a day (TID) | ORAL | 2 refills | Status: DC | PRN

## 2016-12-05 MED ORDER — SODIUM CHLORIDE 0.9 % IV SOLN
Freq: Once | INTRAVENOUS | Status: AC
  Administered 2016-12-05: 10:00:00 via INTRAVENOUS

## 2016-12-05 MED ORDER — PALONOSETRON HCL INJECTION 0.25 MG/5ML
0.2500 mg | Freq: Once | INTRAVENOUS | Status: AC
  Administered 2016-12-05: 0.25 mg via INTRAVENOUS
  Filled 2016-12-05: qty 5

## 2016-12-05 NOTE — Progress Notes (Signed)
Tolerated tx w/o adverse reaction.  Alert, in no distress.  VSS.  Discharged ambulatory. 

## 2016-12-05 NOTE — Progress Notes (Signed)
[facility]  PROGRESS NOTE  Patient Care Team: Tracy School, MD as PCP - General (Internal Medicine)  CHIEF COMPLAINTS/PURPOSE OF CONSULTATION:  Left breast cancer ER+ PR- HER 2 -    Breast cancer of upper-outer quadrant of left female breast ([facility])   07/26/2016 Mammogram    Area of developing asymmetry with distortion located within the upper-outer quadrant left breast. Tissue sampling via tomosynthesis guided biopsy is recommended and will be scheduled.  RECOMMENDATION: Left breast tomosynthesis guided biopsy. This has been scheduled for 07/28/2016.       07/28/2016 Initial Biopsy    Coil shaped clip corresponds to the asymmetry in the outer left breast. Clip is well positioned at the biopsy site. 2. X shaped clip corresponds to the ASYMMETRY/DISTORTION in the upper-outer quadrant of the left breast. Clip is positioned approximately 1 cm medial to the center of the biopsy cavity      07/28/2016 Pathology Results    Estrogen Receptor: 80%, POSITIVE, MODERATE STAINING INTENSITY Progesterone Receptor: 0%, NEGATIVE Proliferation Marker Ki67: 70% HER 2 negative by FISH Breast, left, needle core biopsy, upper outer quadrant - INVASIVE DUCTAL CARCINOMA, SEE COMMENT. - HIGH GRADE DUCTAL CARCINOMA IN SITU WITH NECROSIS.      08/24/2016 Oncotype testing    Recurrence Score Result 30, 10 year risk of distant recurrence Tamoxifen alone 20%      10/03/2016 -  Chemotherapy    The patient had palonosetron (ALOXI) injection 0.25 mg, 0.25 mg, Intravenous,  Once, 2 of 4 cycles Administration: 0.25 mg (10/24/2016)  pegfilgrastim (NEULASTA ONPRO KIT) injection 6 mg, 6 mg, Subcutaneous, Once, 2 of 4 cycles Administration: 6 mg (10/24/2016)  cyclophosphamide (CYTOXAN) 1,380 mg in sodium chloride 0.9 % 250 mL chemo infusion, 600 mg/m2 = 1,380 mg, Intravenous,  Once, 2 of 4 cycles Administration: 1,380 mg (10/24/2016)  DOCEtaxel (TAXOTERE) 170 mg in dextrose 5 % 250 mL chemo  infusion, 75 mg/m2 = 170 mg, Intravenous,  Once, 2 of 4 cycles Dose modification: 65 mg/m2 (original dose 75 mg/m2, Cycle 2, Reason: Dose not tolerated)  fosaprepitant (EMEND) 150 mg, dexamethasone (DECADRON) 12 mg in sodium chloride 0.9 % 145 mL IVPB, , Intravenous,  Once, 1 of 3 cycles Administration:  (10/24/2016)  for chemotherapy treatment.          HISTORY OF PRESENTING ILLNESS:  Tracy Valentine 61 y.o. female is here for follow up of left breast cancer ER+/PR-/HER2-. Ki67 is 70%.   [name] returns to the [facility] today. She is here for Cycle 4 TC.   She complains of feeling a mass in the left breast at 12 o clock position where she had previous surgery performed. States it is tender to touch.  Otherwise she had nausea with last cycle of chemotherapy. Was able to control with antiemetics.      MEDICAL HISTORY:  Past Medical History:  Diagnosis Date  . Anxiety   . Breast cancer (HCC)    breast  . Chronic pain   . DDD (degenerative disc disease), cervical   . DDD (degenerative disc disease), lumbar   . Degenerative disc disease at L5-S1 level   . Depression   . GERD (gastroesophageal reflux disease)   . History of kidney stones   . Hyperlipidemia   . Hypothyroidism   . Obesity   . Osteoarthritis   . PONV (postoperative nausea and vomiting)   . Sleep apnea    uses CPAP  . Spinal stenosis   . Spinal stenosis  SURGICAL HISTORY: Past Surgical History:  Procedure Laterality Date  . ABDOMINAL HYSTERECTOMY     partial hyst, still have ovaries  . BREAST LUMPECTOMY WITH RADIOACTIVE SEED AND SENTINEL LYMPH NODE BIOPSY Left 08/24/2016   Procedure: LEFT BREAST PARTIAL MASTECTOMY WITH RADIOACTIVE SEED AND LEFT SENTINEL LYMPH NODE MAPPING;  Surgeon: Erroll Luna, MD;  Location: [facility];  Service: General;  Laterality: Left;  . CHOLECYSTECTOMY    . FOOT FOREIGN BODY REMOVAL Left   . PORTACATH PLACEMENT Right 09/29/2016   Procedure:  INSERTION PORT-A-CATH RIGHT SUBCLAVIAN;  Surgeon: Aviva Signs, MD;  Location: [facility];  Service: General;  Laterality: Right;    SOCIAL HISTORY: Social History   Social History  . Marital status: Married    Spouse name: N/A  . Number of children: N/A  . Years of education: N/A   Occupational History  . Not on file.   Social History Main Topics  . Smoking status: Never Smoker  . Smokeless tobacco: Never Used  . Alcohol use No  . Drug use: No  . Sexual activity: Yes    Birth control/ protection: None     Comment: married-1 grown son   Other Topics Concern  . Not on file   Social History Narrative  . No narrative on file  1 child; No grandchildren  Never smoked No alcohol Has pets (cat and three dogs)  FAMILY HISTORY: Family History  Problem Relation Age of Onset  . COPD Mother   . Heart attack Father   . Diabetes Sister   . Heart disease Sister   . Kidney cancer Brother   . Breast cancer Cousin   . Kidney failure Sister   . Thyroid disease Sister     Half sister and 2 half brother. 1 full brother. No Breast cancer in immediate family. Paternal cousin had breast cancer. Mother diagnosed with ovarian cancer at 72 years. Father passed from heart attack.   ALLERGIES:  is allergic to adhesive [tape]; amoxicillin; darvocet [propoxyphene n-acetaminophen]; doxycycline; erythromycin; shellfish allergy; zithromax [azithromycin]; and latex.  MEDICATIONS:  Current Outpatient Prescriptions  Medication Sig Dispense Refill  . ALPRAZolam (XANAX) 1 MG tablet Take 1 mg by mouth 4 (four) times daily as needed for sleep.    [name] Kitchen aspirin 325 MG tablet Take 325 mg by mouth daily.    . Cholecalciferol (VITAMIN D) 2000 UNITS CAPS Take 2,000 Units by mouth daily.    . clindamycin (CLEOCIN) 300 MG capsule Take 1 capsule (300 mg total) by mouth 4 (four) times daily. 40 capsule 0  . Cyclophosphamide (CYTOXAN IJ) Inject as directed every 21 ( twenty-one) days. To start 10/03/16    .  dexamethasone (DECADRON) 4 MG tablet The day before, day of, and day after chemo take 2 tablets in the am and 2 tablets in the pm. Take with food. 36 tablet 1  . Diphenhyd-Hydrocort-Nystatin (FIRST-DUKES MOUTHWASH) SUSP Use as directed 5 mLs in the mouth or throat 4 (four) times daily as needed. 300 mL 3  . DOCEtaxel (TAXOTERE IV) Inject into the vein every 21 ( twenty-one) days. To start 10/03/16    . docusate sodium (COLACE) 50 MG capsule Take 100 mg by mouth 2 (two) times daily.    . fluconazole (DIFLUCAN) 100 MG tablet Take 1 tablet (100 mg total) by mouth daily. 10 tablet 1  . fluticasone (FLONASE) 50 MCG/ACT nasal spray Place 1 spray into both nostrils daily.    [name] Kitchen ipratropium (ATROVENT) 0.03 % nasal spray 1 spray  daily.  4  . KRILL OIL PO Take 1 capsule by mouth daily.    [name] Kitchen levothyroxine (SYNTHROID, LEVOTHROID) 100 MCG tablet Take 100 mcg by mouth daily.  5  . lidocaine-prilocaine (EMLA) cream Apply a quarter size amount to port site 1 hour prior to chemo. Do not rub in. Cover with plastic wrap. 30 g 3  . meloxicam (MOBIC) 7.5 MG tablet   0  . montelukast (SINGULAIR) 10 MG tablet Take 10 mg by mouth daily.  11  . nystatin (MYCOSTATIN) 100000 UNIT/ML suspension   2  . ondansetron (ZOFRAN) 8 MG tablet Take 1 tablet (8 mg total) by mouth every 8 (eight) hours as needed for nausea or vomiting. 30 tablet 2  . Oxycodone HCl 10 MG TABS   0  . Pegfilgrastim (NEULASTA ONPRO ) Inject into the skin. To be administered 27 hours after the completion of chemo every 21 days.    . potassium chloride SA (K-DUR,KLOR-CON) 20 MEQ tablet Take 2 tablets (40 mEq total) by mouth daily. 60 tablet 0  . prochlorperazine (COMPAZINE) 10 MG tablet Take 1 tablet (10 mg total) by mouth every 6 (six) hours as needed for nausea or vomiting. 30 tablet 2  . senna (SENOKOT) 8.6 MG TABS tablet Take 1 tablet by mouth at bedtime as needed.    . simvastatin (ZOCOR) 20 MG tablet Take 20 mg by mouth every evening.    .  sulfamethoxazole-trimethoprim (BACTRIM DS,SEPTRA DS) 800-160 MG tablet   0  . venlafaxine (EFFEXOR) 75 MG tablet Take 75 mg by mouth 2 (two) times daily.    [name] Kitchen zolpidem (AMBIEN) 10 MG tablet Take 10 mg by mouth at bedtime.     No current facility-administered medications for this visit.     Review of Systems  Constitutional: Negative.   HENT: Negative.   Eyes: Negative.   Respiratory: Negative.   Cardiovascular: Negative.   Gastrointestinal: Positive for nausea (with last cycle of chemotherapy. Controlled with antiemetics ).  Genitourinary: Negative.   Musculoskeletal: Negative.   Skin: Negative.        Mass on left breast at 12 o clock position. Tender to touch  Neurological: Negative.   Endo/Heme/Allergies: Negative.   Psychiatric/Behavioral: Negative.   All other systems reviewed and are negative. 14 point ROS was done and is otherwise as detailed above or in HPI   PHYSICAL EXAMINATION: ECOG PERFORMANCE STATUS: 0 - Asymptomatic Vitals:   12/05/16 0847  BP: 136/64  Pulse: 83  Resp: 20  Temp: 98.7 F (37.1 C)   Filed Weights   12/05/16 0847  Weight: 254 lb (115.2 kg)     Physical Exam  Constitutional: She is oriented to person, place, and time and well-developed, well-nourished, and in no distress.  HENT:  Head: Normocephalic and atraumatic.  Nose: Nose normal.  Mouth/Throat: Oropharynx is clear and moist. No oropharyngeal exudate.  Wearing hair prosthesis  Eyes: Conjunctivae and EOM are normal. Pupils are equal, round, and reactive to light. Right eye exhibits no discharge. Left eye exhibits no discharge. No scleral icterus.  Neck: Normal range of motion. Neck supple. No tracheal deviation present. No thyromegaly present.  Cardiovascular: Normal rate, regular rhythm and normal heart sounds.  Exam reveals no gallop and no friction rub.   No murmur heard. Pulmonary/Chest: Effort normal and breath sounds normal. She has no wheezes. She has no rales.    Abdominal:  Soft. Bowel sounds are normal. She exhibits no distension and no mass. There is no tenderness. There  is no rebound and no guarding.  Musculoskeletal: Normal range of motion. She exhibits no edema.  Lymphadenopathy:    She has no cervical adenopathy.  Neurological: She is alert and oriented to person, place, and time. She has normal reflexes. No cranial nerve deficit. Gait normal. Coordination normal.  Skin: Skin is warm and dry. No rash noted.  Psychiatric: Mood, memory, affect and judgment normal.  Nursing note and vitals reviewed.   LABORATORY DATA:  I have reviewed the data as listed Lab Results  Component Value Date   WBC 18.1 (H) 11/14/2016   HGB 12.1 11/14/2016   HCT 36.4 11/14/2016   MCV 98.9 11/14/2016   PLT 234 11/14/2016   CMP     Component Value Date/Time   NA 137 11/14/2016 0941   K 4.1 11/14/2016 0941   CL 105 11/14/2016 0941   CO2 25 11/14/2016 0941   GLUCOSE 138 (H) 11/14/2016 0941   BUN 14 11/14/2016 0941   CREATININE 0.69 11/14/2016 0941   CALCIUM 9.6 11/14/2016 0941   PROT 6.4 (L) 11/14/2016 0941   ALBUMIN 3.7 11/14/2016 0941   AST 19 11/14/2016 0941   ALT 20 11/14/2016 0941   ALKPHOS 65 11/14/2016 0941   BILITOT 0.4 11/14/2016 0941   GFRNONAA >60 11/14/2016 0941   GFRAA >60 11/14/2016 0941     RADIOGRAPHIC STUDIES: I have personally reviewed the radiological images as listed and agreed with the findings in the report. Study Result   ULTRASOUND LEFT UPPER EXTREMITY LIMITED 10/31/2016  IMPRESSION: Negative ultrasound of the site of clinical concern at the LEFT antecubital fossa; recommend clinical management.  If patient has persistent symptoms or enlarging palpable abnormality, could consider MR for further assessment.   PATHOLOGY:            ASSESSMENT & PLAN:  L breast Cancer ER+ PR- HER 2 - Oncotype recurrence score 30 LUE abscess Chemotherapy induced diarrhea  Breast exam performed today, the mass under her previous  lumpectomy scar feels like a seroma. She has a follow up with her surgeon coming up soon, I have advised her to follow up with him as well.  She will complete cycle 4 of TC today.  Refer to radiation oncology for adjuvant left breast radiation.   Once patient completes treatment will place her on endocrine therapy.   RTC in 4 weeks for follow up.    This document serves as a record of services personally performed by Twana First, MD. It was created on her behalf by Shirlean Mylar, a trained medical scribe. The creation of this record is based on the scribe's personal observations and the provider's statements to them. This document has been checked and approved by the attending provider.  I have reviewed the above documentation for accuracy and completeness and I agree with the above.  This note was electronically signed.    [employer]  12/05/2016 9:03 AM

## 2016-12-05 NOTE — Patient Instructions (Signed)
[facility] Discharge Instructions for Patients Receiving Chemotherapy   Beginning January 23rd 2017 lab work for the Cancer Center will be done in the  Main lab at [facility] on 1st floor. If you have a lab appointment with the Cancer Center please come in thru the  Main Entrance and check in at the main information desk   Today you received the following chemotherapy agents:  Taxotere and Cytoxan  If you develop nausea and vomiting, or diarrhea that is not controlled by your medication, call the clinic.  The clinic phone number is [phone]. Office hours are Monday-Friday 8:30am-5:00pm.  BELOW ARE SYMPTOMS THAT SHOULD BE REPORTED IMMEDIATELY:  *FEVER GREATER THAN 101.0 F  *CHILLS WITH OR WITHOUT FEVER  NAUSEA AND VOMITING THAT IS NOT CONTROLLED WITH YOUR NAUSEA MEDICATION  *UNUSUAL SHORTNESS OF BREATH  *UNUSUAL BRUISING OR BLEEDING  TENDERNESS IN MOUTH AND THROAT WITH OR WITHOUT PRESENCE OF ULCERS  *URINARY PROBLEMS  *BOWEL PROBLEMS  UNUSUAL RASH Items with * indicate a potential emergency and should be followed up as soon as possible. If you have an emergency after office hours please contact your primary care physician or go to the nearest emergency department.  Please call the clinic during office hours if you have any questions or concerns.   You may also contact the Patient Navigator at [phone] should you have any questions or need assistance in obtaining follow up care.      Resources For Cancer Patients and their Caregivers ? American Cancer Society: Can assist with transportation, wigs, general needs, runs Look Good Feel Better.        1-888-227-6333 ? Cancer Care: Provides financial assistance, online support groups, medication/co-pay assistance.  1-800-813-HOPE (4673) ? [facility] Assists [place] cancer patients and their families through emotional , educational and financial support.   336-427-4357 ? [place] DSS Where to apply for food stamps, Medicaid and utility assistance. 336-342-1394 ? RCATS: Transportation to medical appointments. 336-347-2287 ? Social Security Administration: May apply for disability if have a Stage IV cancer. 336-342-7796 1-800-772-1213 ? [place] Aging, Disability and Transit Services: Assists with nutrition, care and transit needs. 336-349-2343         

## 2016-12-05 NOTE — Patient Instructions (Addendum)
[facility] at [facility] Discharge Instructions  RECOMMENDATIONS MADE BY THE CONSULTANT AND ANY TEST RESULTS WILL BE SENT TO YOUR REFERRING PHYSICIAN.   Return to see Dr. Talbert Cage in 4 weeks  We are referring you back to [facility] for radiation  Thank you for choosing [facility] at [facility] to provide your oncology and hematology care.  To afford each patient quality time with our provider, please arrive at least 15 minutes before your scheduled appointment time.    If you have a lab appointment with the [facility] please come in thru the  Main Entrance and check in at the main information desk  You need to re-schedule your appointment should you arrive 10 or more minutes late.  We strive to give you quality time with our providers, and arriving late affects you and other patients whose appointments are after yours.  Also, if you no show three or more times for appointments you may be dismissed from the clinic at the providers discretion.     Again, thank you for choosing [facility].  Our hope is that these requests will decrease the amount of time that you wait before being seen by our physicians.       _____________________________________________________________  Should you have questions after your visit to [facility], please contact our office at [phone] between the hours of 8:30 a.m. and 4:30 p.m.  Voicemails left after 4:30 p.m. will not be returned until the following business day.  For prescription refill requests, have your pharmacy contact our office.       Resources For Cancer Patients and their Caregivers ? American Cancer Society: Can assist with transportation, wigs, general needs, runs Look Good Feel Better.        262-164-2965 ? Cancer Care: Provides financial assistance, online support groups, medication/co-pay assistance.  1-800-813-HOPE 940-618-1268) ? [facility] Assists [place] cancer patients and their families through emotional , educational and financial support.  684-653-0803 ? [place] DSS Where to apply for food stamps, Medicaid and utility assistance. 915-840-7649 ? RCATS: Transportation to medical appointments. 210-856-3759 ? Social Security Administration: May apply for disability if have a Stage IV cancer. (506)142-3732 (873) 657-8835 ? [employer], Disability and Transit Services: Assists with nutrition, care and transit needs. [place] Support Programs: @10RELATIVEDAYS @ > Cancer Support Group  2nd Tuesday of the month 1pm-2pm, Journey Room  > Creative Journey  3rd Tuesday of the month 1130am-1pm, Journey Room  > Look Good Feel Better  1st Wednesday of the month 10am-12 noon, Journey Room (Call [facility] to register [phone])

## 2016-12-05 NOTE — Progress Notes (Signed)
[facility] Clinical Social Work  Clinical Social Work was referred by patient for assessment of psychosocial needs due to pt request due to ongoing financial needs. Clinical Social Worker met with patient at [facility] to offer support and assess for needs.  CSW re-reviewed with pt options for assistance and how to access. CSW reviewed applications for [employer] [place] that CSW had recently reviewed with pt. CSW reviewed documents pt needs to gather from her home in order to apply. Once pt gathers needed documents, CSW would work with her on the applications. Pt stated understanding and requested her friend to remind her. CSW also praised pt for reaching out to [employer] and applying for assistance through them as well. CSW to follow, pt plans to gather needed paperwork and meet with pt on a Tuesday to complete applications. Pt aware to reach out to schedule future appt with CSW.  CSW also encouraged pt to reach out to hospital and [facility] financial counselors for additional assistance through [facility].    Clinical Social Work interventions: Resource education and referral  Tracy Valentine, [place] Tuesdays   [phone]

## 2016-12-07 DIAGNOSIS — Z510 Encounter for antineoplastic radiation therapy: Secondary | ICD-10-CM | POA: Diagnosis not present

## 2016-12-07 DIAGNOSIS — Z170 Estrogen receptor positive status [ER+]: Secondary | ICD-10-CM | POA: Diagnosis not present

## 2016-12-07 DIAGNOSIS — C50412 Malignant neoplasm of upper-outer quadrant of left female breast: Secondary | ICD-10-CM | POA: Diagnosis not present

## 2016-12-12 DIAGNOSIS — Z510 Encounter for antineoplastic radiation therapy: Secondary | ICD-10-CM | POA: Diagnosis not present

## 2016-12-12 DIAGNOSIS — C50412 Malignant neoplasm of upper-outer quadrant of left female breast: Secondary | ICD-10-CM | POA: Diagnosis not present

## 2016-12-12 DIAGNOSIS — Z170 Estrogen receptor positive status [ER+]: Secondary | ICD-10-CM | POA: Diagnosis not present

## 2016-12-13 DIAGNOSIS — Z510 Encounter for antineoplastic radiation therapy: Secondary | ICD-10-CM | POA: Diagnosis not present

## 2016-12-13 DIAGNOSIS — C50412 Malignant neoplasm of upper-outer quadrant of left female breast: Secondary | ICD-10-CM | POA: Diagnosis not present

## 2016-12-19 DIAGNOSIS — C50412 Malignant neoplasm of upper-outer quadrant of left female breast: Secondary | ICD-10-CM | POA: Diagnosis not present

## 2016-12-19 DIAGNOSIS — Z170 Estrogen receptor positive status [ER+]: Secondary | ICD-10-CM | POA: Diagnosis not present

## 2016-12-19 DIAGNOSIS — Z510 Encounter for antineoplastic radiation therapy: Secondary | ICD-10-CM | POA: Diagnosis not present

## 2016-12-20 ENCOUNTER — Other Ambulatory Visit (HOSPITAL_COMMUNITY): Payer: Self-pay | Admitting: Emergency Medicine

## 2016-12-20 DIAGNOSIS — Z510 Encounter for antineoplastic radiation therapy: Secondary | ICD-10-CM | POA: Diagnosis not present

## 2016-12-20 DIAGNOSIS — C50412 Malignant neoplasm of upper-outer quadrant of left female breast: Secondary | ICD-10-CM | POA: Diagnosis not present

## 2016-12-20 NOTE — Progress Notes (Signed)
Called pt and explained [facility] clinic on 01/02/2017.  Its from 9-11am.  She will see OT, RD, and SW.  It will be on the second floor.  She seemed very interested in coming.

## 2016-12-25 DIAGNOSIS — Z170 Estrogen receptor positive status [ER+]: Secondary | ICD-10-CM | POA: Diagnosis not present

## 2016-12-25 DIAGNOSIS — C50412 Malignant neoplasm of upper-outer quadrant of left female breast: Secondary | ICD-10-CM | POA: Diagnosis not present

## 2017-01-02 ENCOUNTER — Ambulatory Visit (HOSPITAL_COMMUNITY): Payer: Medicare Other

## 2017-01-03 DIAGNOSIS — Z170 Estrogen receptor positive status [ER+]: Secondary | ICD-10-CM | POA: Diagnosis not present

## 2017-01-03 DIAGNOSIS — C50412 Malignant neoplasm of upper-outer quadrant of left female breast: Secondary | ICD-10-CM | POA: Diagnosis not present

## 2017-01-08 DIAGNOSIS — G894 Chronic pain syndrome: Secondary | ICD-10-CM | POA: Diagnosis not present

## 2017-01-08 DIAGNOSIS — M47816 Spondylosis without myelopathy or radiculopathy, lumbar region: Secondary | ICD-10-CM | POA: Diagnosis not present

## 2017-01-08 DIAGNOSIS — M5416 Radiculopathy, lumbar region: Secondary | ICD-10-CM | POA: Diagnosis not present

## 2017-01-08 DIAGNOSIS — M47812 Spondylosis without myelopathy or radiculopathy, cervical region: Secondary | ICD-10-CM | POA: Diagnosis not present

## 2017-01-09 ENCOUNTER — Telehealth (HOSPITAL_COMMUNITY): Payer: Self-pay | Admitting: Emergency Medicine

## 2017-01-09 DIAGNOSIS — C50412 Malignant neoplasm of upper-outer quadrant of left female breast: Secondary | ICD-10-CM

## 2017-01-09 NOTE — Telephone Encounter (Signed)
Pt set up for [facility] clinic on 01/30/2017 from 9-11 am  OT referral placed

## 2017-01-10 ENCOUNTER — Encounter (HOSPITAL_COMMUNITY): Payer: Self-pay | Admitting: Hematology

## 2017-01-10 ENCOUNTER — Encounter (HOSPITAL_COMMUNITY): Payer: Medicare Other | Attending: Hematology & Oncology | Admitting: Hematology

## 2017-01-10 VITALS — BP 147/70 | HR 66 | Temp 97.6°F | Resp 18 | Wt 256.1 lb

## 2017-01-10 DIAGNOSIS — C50412 Malignant neoplasm of upper-outer quadrant of left female breast: Secondary | ICD-10-CM | POA: Insufficient documentation

## 2017-01-10 DIAGNOSIS — Z170 Estrogen receptor positive status [ER+]: Secondary | ICD-10-CM | POA: Insufficient documentation

## 2017-01-10 DIAGNOSIS — I890 Lymphedema, not elsewhere classified: Secondary | ICD-10-CM | POA: Diagnosis not present

## 2017-01-10 DIAGNOSIS — E876 Hypokalemia: Secondary | ICD-10-CM | POA: Insufficient documentation

## 2017-01-10 DIAGNOSIS — L089 Local infection of the skin and subcutaneous tissue, unspecified: Secondary | ICD-10-CM | POA: Insufficient documentation

## 2017-01-10 DIAGNOSIS — R110 Nausea: Secondary | ICD-10-CM | POA: Insufficient documentation

## 2017-01-10 DIAGNOSIS — B370 Candidal stomatitis: Secondary | ICD-10-CM | POA: Insufficient documentation

## 2017-01-10 DIAGNOSIS — R300 Dysuria: Secondary | ICD-10-CM | POA: Insufficient documentation

## 2017-01-10 NOTE — Patient Instructions (Signed)
[facility] at [facility] Discharge Instructions  RECOMMENDATIONS MADE BY THE CONSULTANT AND ANY TEST RESULTS WILL BE SENT TO YOUR REFERRING PHYSICIAN.  You were seen today by Dr. Rosemarie Beath your arm and use the roller at home We do not want to start you on hormone therapy until radiation is complete.  Bone density scan will be scheduled Follow up about 1-2  weeks after radiation with lab work See Amy up front for appointments   Thank you for choosing [facility] at [facility] to provide your oncology and hematology care.  To afford each patient quality time with our provider, please arrive at least 15 minutes before your scheduled appointment time.    If you have a lab appointment with the [facility] please come in thru the  Main Entrance and check in at the main information desk  You need to re-schedule your appointment should you arrive 10 or more minutes late.  We strive to give you quality time with our providers, and arriving late affects you and other patients whose appointments are after yours.  Also, if you no show three or more times for appointments you may be dismissed from the clinic at the providers discretion.     Again, thank you for choosing [facility].  Our hope is that these requests will decrease the amount of time that you wait before being seen by our physicians.       _____________________________________________________________  Should you have questions after your visit to [facility], please contact our office at [phone] between the hours of 8:30 a.m. and 4:30 p.m.  Voicemails left after 4:30 p.m. will not be returned until the following business day.  For prescription refill requests, have your pharmacy contact our office.       Resources For Cancer Patients and their Caregivers ? American Cancer Society: Can assist with transportation, wigs, general needs, runs Look Good Feel  Better.        661-331-1906 ? Cancer Care: Provides financial assistance, online support groups, medication/co-pay assistance.  1-800-813-HOPE 276-365-3046) ? [facility] Assists [place] cancer patients and their families through emotional , educational and financial support.  (725)790-7663 ? [place] DSS Where to apply for food stamps, Medicaid and utility assistance. (631)177-9704 ? RCATS: Transportation to medical appointments. 469-110-6449 ? Social Security Administration: May apply for disability if have a Stage IV cancer. (914)548-7934 970 331 9451 ? [employer], Disability and Transit Services: Assists with nutrition, care and transit needs. [place] Support Programs: @10RELATIVEDAYS @ > Cancer Support Group  2nd Tuesday of the month 1pm-2pm, Journey Room  > Creative Journey  3rd Tuesday of the month 1130am-1pm, Journey Room  > Look Good Feel Better  1st Wednesday of the month 10am-12 noon, Journey Room (Call [facility] to register [phone])

## 2017-01-14 NOTE — Progress Notes (Signed)
[name] Kitchen  HEMATOLOGY ONCOLOGY PROGRESS NOTE  Date of service: .01/10/2017  Patient Care Team: [employer], MD as PCP - General (Internal Medicine)  CC: f/u for left breast cancer ER positive PR negative HER-2/neu negative   SUMMARY OF ONCOLOGIC HISTORY:   Breast cancer of upper-outer quadrant of left female breast ([place])   07/26/2016 Mammogram    Area of developing asymmetry with distortion located within the upper-outer quadrant left breast. Tissue sampling via tomosynthesis guided biopsy is recommended and will be scheduled.  RECOMMENDATION: Left breast tomosynthesis guided biopsy. This has been scheduled for 07/28/2016.       07/28/2016 Initial Biopsy    Coil shaped clip corresponds to the asymmetry in the outer left breast. Clip is well positioned at the biopsy site. 2. X shaped clip corresponds to the ASYMMETRY/DISTORTION in the upper-outer quadrant of the left breast. Clip is positioned approximately 1 cm medial to the center of the biopsy cavity      07/28/2016 Pathology Results    Estrogen Receptor: 80%, POSITIVE, MODERATE STAINING INTENSITY Progesterone Receptor: 0%, NEGATIVE Proliferation Marker Ki67: 70% HER 2 negative by FISH Breast, left, needle core biopsy, upper outer quadrant - INVASIVE DUCTAL CARCINOMA, SEE COMMENT. - HIGH GRADE DUCTAL CARCINOMA IN SITU WITH NECROSIS.      08/24/2016 Oncotype testing    Recurrence Score Result 30, 10 year risk of distant recurrence Tamoxifen alone 20%      10/03/2016 -  Chemotherapy    The patient had palonosetron (ALOXI) injection 0.25 mg, 0.25 mg, Intravenous,  Once, 2 of 4 cycles Administration: 0.25 mg (10/24/2016)  pegfilgrastim (NEULASTA ONPRO KIT) injection 6 mg, 6 mg, Subcutaneous, Once, 2 of 4 cycles Administration: 6 mg (10/24/2016)  cyclophosphamide (CYTOXAN) 1,380 mg in sodium chloride 0.9 % 250 mL chemo infusion, 600 mg/m2 = 1,380 mg, Intravenous,  Once, 2 of 4 cycles Administration: 1,380 mg  (10/24/2016)  DOCEtaxel (TAXOTERE) 170 mg in dextrose 5 % 250 mL chemo infusion, 75 mg/m2 = 170 mg, Intravenous,  Once, 2 of 4 cycles Dose modification: 65 mg/m2 (original dose 75 mg/m2, Cycle 2, Reason: Dose not tolerated)  fosaprepitant (EMEND) 150 mg, dexamethasone (DECADRON) 12 mg in sodium chloride 0.9 % 145 mL IVPB, , Intravenous,  Once, 1 of 3 cycles Administration:  (10/24/2016)  for chemotherapy treatment.         INTERVAL HISTORY:  Tracy Valentine 61 y.o. female is here for follow up of left breast cancer ER+/PR-/HER2-. Ki67 is 70%.  She has completed her 4 cycles of adjuvant TC for high-grade early stage breast cancer with a high Oncotype DX score and high grade DCIS. She is she notes some mild lymphedema and has an appointment with physical therapy and rehabilitation to help with this. She notes some left axillary discomfort from surgery and healed previous left axillary abscess.  We discussed options for adjuvant endocrine therapy after completion of radiation. All her questions were answered in details.  REVIEW OF SYSTEMS:    10 Point review of systems of done and is negative except as noted above.  . Past Medical History:  Diagnosis Date  . Anxiety   . Breast cancer (HCC)    breast  . Chronic pain   . DDD (degenerative disc disease), cervical   . DDD (degenerative disc disease), lumbar   . Degenerative disc disease at L5-S1 level   . Depression   . GERD (gastroesophageal reflux disease)   . History of kidney stones   . Hyperlipidemia   .  Hypothyroidism   . Obesity   . Osteoarthritis   . PONV (postoperative nausea and vomiting)   . Sleep apnea    uses CPAP  . Spinal stenosis   . Spinal stenosis     . Past Surgical History:  Procedure Laterality Date  . ABDOMINAL HYSTERECTOMY     partial hyst, still have ovaries  . BREAST LUMPECTOMY WITH RADIOACTIVE SEED AND SENTINEL LYMPH NODE BIOPSY Left 08/24/2016   Procedure: LEFT BREAST PARTIAL MASTECTOMY WITH  RADIOACTIVE SEED AND LEFT SENTINEL LYMPH NODE MAPPING;  Surgeon: Erroll Luna, MD;  Location: [facility];  Service: General;  Laterality: Left;  . CHOLECYSTECTOMY    . FOOT FOREIGN BODY REMOVAL Left   . PORTACATH PLACEMENT Right 09/29/2016   Procedure: INSERTION PORT-A-CATH RIGHT SUBCLAVIAN;  Surgeon: Aviva Signs, MD;  Location: [facility];  Service: General;  Laterality: Right;    . Social History  Substance Use Topics  . Smoking status: Never Smoker  . Smokeless tobacco: Never Used  . Alcohol use No    ALLERGIES:  is allergic to adhesive [tape]; amoxicillin; darvocet [propoxyphene n-acetaminophen]; doxycycline; erythromycin; shellfish allergy; zithromax [azithromycin]; and latex.  MEDICATIONS:  Current Outpatient Prescriptions  Medication Sig Dispense Refill  . ALPRAZolam (XANAX) 1 MG tablet Take 1 mg by mouth 4 (four) times daily as needed for sleep.    [name] Kitchen aspirin 325 MG tablet Take 325 mg by mouth daily.    . Cholecalciferol (VITAMIN D) 2000 UNITS CAPS Take 2,000 Units by mouth daily.    . clindamycin (CLEOCIN) 300 MG capsule Take 1 capsule (300 mg total) by mouth 4 (four) times daily. 40 capsule 0  . Diphenhyd-Hydrocort-Nystatin (FIRST-DUKES MOUTHWASH) SUSP Use as directed 5 mLs in the mouth or throat 4 (four) times daily as needed. 300 mL 3  . docusate sodium (COLACE) 50 MG capsule Take 100 mg by mouth 2 (two) times daily.    . fluticasone (FLONASE) 50 MCG/ACT nasal spray Place 1 spray into both nostrils daily.    [name] Kitchen ipratropium (ATROVENT) 0.03 % nasal spray 1 spray daily.  4  . KRILL OIL PO Take 1 capsule by mouth daily.    [name] Kitchen levothyroxine (SYNTHROID, LEVOTHROID) 100 MCG tablet Take 100 mcg by mouth daily.  5  . lidocaine-prilocaine (EMLA) cream Apply a quarter size amount to port site 1 hour prior to chemo. Do not rub in. Cover with plastic wrap. 30 g 3  . montelukast (SINGULAIR) 10 MG tablet Take 10 mg by mouth daily.  11  . nystatin (MYCOSTATIN) 100000 UNIT/ML  suspension   2  . ondansetron (ZOFRAN) 8 MG tablet Take 1 tablet (8 mg total) by mouth every 8 (eight) hours as needed for nausea or vomiting. 30 tablet 2  . Oxycodone HCl 10 MG TABS   0  . Pegfilgrastim (NEULASTA ONPRO [place]) Inject into the skin. To be administered 27 hours after the completion of chemo every 21 days.    . potassium chloride SA (K-DUR,KLOR-CON) 20 MEQ tablet Take 2 tablets (40 mEq total) by mouth daily. 60 tablet 0  . prochlorperazine (COMPAZINE) 10 MG tablet Take 1 tablet (10 mg total) by mouth every 6 (six) hours as needed for nausea or vomiting. 30 tablet 2  . senna (SENOKOT) 8.6 MG TABS tablet Take 1 tablet by mouth at bedtime as needed.    . simvastatin (ZOCOR) 20 MG tablet Take 20 mg by mouth every evening.    . sulfamethoxazole-trimethoprim (BACTRIM DS,SEPTRA DS) 800-160 MG tablet  0  . venlafaxine (EFFEXOR) 75 MG tablet Take 75 mg by mouth 2 (two) times daily.    [name] Kitchen zolpidem (AMBIEN) 10 MG tablet Take 10 mg by mouth at bedtime.     No current facility-administered medications for this visit.     PHYSICAL EXAMINATION: ECOG PERFORMANCE STATUS: 1 - Symptomatic but completely ambulatory  . Vitals:   01/10/17 1528  BP: (!) 147/70  Pulse: 66  Resp: 18  Temp: 97.6 F (36.4 C)    Filed Weights   01/10/17 1528  Weight: 256 lb 1.6 oz (116.2 kg)   .Body mass index is 41.34 kg/m.  GENERAL:alert, in no acute distress and comfortable SKIN: no acute rashes, no significant lesions EYES: conjunctiva are pink and non-injected, sclera anicteric OROPHARYNX: MMM, no exudates, no oropharyngeal erythema or ulceration NECK: supple, no JVD LYMPH:  no palpable lymphadenopathy in the cervical, axillary or inguinal regions LUNGS: clear to auscultation b/l with normal respiratory effort HEART: regular rate & rhythm ABDOMEN:  normoactive bowel sounds , non tender, not distended. Extremity: no pedal edema, mild lymphedema left upper extremity. No redness or tenderness to  palpation. PSYCH: alert & oriented x 3 with fluent speech NEURO: no focal motor/sensory deficits  LABORATORY DATA:   I have reviewed the data as listed  . CBC Latest Ref Rng & Units 12/05/2016 11/14/2016 10/24/2016  WBC 4.0 - 10.5 K/uL 16.5(H) 18.1(H) 14.0(H)  Hemoglobin 12.0 - 15.0 g/dL 12.1 12.1 13.1  Hematocrit 36.0 - 46.0 % 36.6 36.4 39.3  Platelets 150 - 400 K/uL 275 234 398    . CMP Latest Ref Rng & Units 12/05/2016 11/14/2016 10/24/2016  Glucose 65 - 99 mg/dL 149(H) 138(H) 132(H)  BUN 6 - 20 mg/dL '12 14 14  ' Creatinine 0.44 - 1.00 mg/dL 0.78 0.69 0.74  Sodium 135 - 145 mmol/L 138 137 135  Potassium 3.5 - 5.1 mmol/L 3.8 4.1 4.2  Chloride 101 - 111 mmol/L 102 105 102  CO2 22 - 32 mmol/L '27 25 27  ' Calcium 8.9 - 10.3 mg/dL 10.2 9.6 9.9  Total Protein 6.5 - 8.1 g/dL 6.7 6.4(L) 7.3  Total Bilirubin 0.3 - 1.2 mg/dL 0.5 0.4 0.4  Alkaline Phos 38 - 126 U/L 69 65 103  AST 15 - 41 U/L '20 19 23  ' ALT 14 - 54 U/L '18 20 24     ' RADIOGRAPHIC STUDIES: I have personally reviewed the radiological images as listed and agreed with the findings in the report. No results found.  ASSESSMENT & PLAN:   61 year old female with   1) Early Stage pT1C,pN0, Mx (Stage IA) with high-grade ER positive PR negative HER-2/neu negative breast cancer High-grade DCIS Oncotype recurrence score of 30 Plan -Patient is status post left breast lumpectomy, adjuvant chemotherapy with TCx4 cycles. -She is currently getting adjuvant radiation therapy at [facility] which will be completed on 02/01/2017. -She will be considered menopausal the clinical features not ascertainable due to surgical menopause at age 89 years. -Would recommend an aromatase inhibitor letrozole or anastrozole after completion of radiation therapy. -Might consider getting BCI testing to determine if she might be candidate for extended hormonal therapy. -We shall schedule a bone density scan for baseline skeletal evaluation.  2)Left upper extremity  lymphedema   also some axillary discomfort due to surgery and resolved left axillary abscess. Plan  -Recommended to keep her hand elevated . -Low blood pressure measurements IV lines or lab draws on left upper extremity . -She has an appointment with physical therapy and rehabilitation  to address her lymphedema and consider using a sleeve as appropriate .  Return to clinic with labs 1-2 weeks after completion of radiation therapy to finalize adjuvant endocrine therapy . With bone density scan .   I spent 20 minutes counseling the patient face to face. The total time spent in the appointment was 25 minutes and more than 50% was on counseling and direct patient cares.    Sullivan Lone MD [employer] AAHIVMS [facility] Hematology/Oncology Physician [facility]  (Office):       [phone] (Work cell):  [phone] (Fax):           [phone]

## 2017-01-22 DIAGNOSIS — C50412 Malignant neoplasm of upper-outer quadrant of left female breast: Secondary | ICD-10-CM | POA: Diagnosis not present

## 2017-01-26 DIAGNOSIS — C50412 Malignant neoplasm of upper-outer quadrant of left female breast: Secondary | ICD-10-CM | POA: Diagnosis not present

## 2017-01-29 DIAGNOSIS — C50412 Malignant neoplasm of upper-outer quadrant of left female breast: Secondary | ICD-10-CM | POA: Diagnosis not present

## 2017-01-30 ENCOUNTER — Encounter (HOSPITAL_COMMUNITY): Payer: Medicare Other

## 2017-01-30 ENCOUNTER — Ambulatory Visit (HOSPITAL_COMMUNITY): Payer: Medicare Other | Attending: Internal Medicine

## 2017-01-30 ENCOUNTER — Other Ambulatory Visit (HOSPITAL_COMMUNITY): Payer: Self-pay

## 2017-01-30 ENCOUNTER — Encounter: Payer: Self-pay | Admitting: *Deleted

## 2017-01-30 ENCOUNTER — Encounter (HOSPITAL_COMMUNITY): Payer: Self-pay

## 2017-01-30 ENCOUNTER — Encounter (HOSPITAL_COMMUNITY): Payer: Medicare Other | Attending: Hematology & Oncology

## 2017-01-30 VITALS — BP 142/65 | HR 66 | Temp 98.4°F | Resp 18

## 2017-01-30 DIAGNOSIS — R300 Dysuria: Secondary | ICD-10-CM | POA: Insufficient documentation

## 2017-01-30 DIAGNOSIS — E876 Hypokalemia: Secondary | ICD-10-CM | POA: Insufficient documentation

## 2017-01-30 DIAGNOSIS — B370 Candidal stomatitis: Secondary | ICD-10-CM | POA: Insufficient documentation

## 2017-01-30 DIAGNOSIS — Z170 Estrogen receptor positive status [ER+]: Principal | ICD-10-CM

## 2017-01-30 DIAGNOSIS — R29898 Other symptoms and signs involving the musculoskeletal system: Secondary | ICD-10-CM | POA: Diagnosis present

## 2017-01-30 DIAGNOSIS — C50412 Malignant neoplasm of upper-outer quadrant of left female breast: Secondary | ICD-10-CM | POA: Insufficient documentation

## 2017-01-30 DIAGNOSIS — Z95828 Presence of other vascular implants and grafts: Secondary | ICD-10-CM

## 2017-01-30 DIAGNOSIS — Z452 Encounter for adjustment and management of vascular access device: Secondary | ICD-10-CM | POA: Diagnosis not present

## 2017-01-30 DIAGNOSIS — R110 Nausea: Secondary | ICD-10-CM | POA: Insufficient documentation

## 2017-01-30 DIAGNOSIS — Z853 Personal history of malignant neoplasm of breast: Secondary | ICD-10-CM | POA: Diagnosis not present

## 2017-01-30 DIAGNOSIS — L089 Local infection of the skin and subcutaneous tissue, unspecified: Secondary | ICD-10-CM | POA: Insufficient documentation

## 2017-01-30 MED ORDER — HEPARIN SOD (PORK) LOCK FLUSH 100 UNIT/ML IV SOLN
500.0000 [IU] | Freq: Once | INTRAVENOUS | Status: AC
  Administered 2017-01-30: 500 [IU] via INTRAVENOUS

## 2017-01-30 MED ORDER — ONDANSETRON HCL 8 MG PO TABS: 8.0000 mg | ORAL_TABLET | Freq: Three times a day (TID) | ORAL | 2 refills | Status: DC | PRN

## 2017-01-30 MED ORDER — SODIUM CHLORIDE 0.9% FLUSH
10.0000 mL | INTRAVENOUS | Status: DC | PRN
  Administered 2017-01-30: 10 mL via INTRAVENOUS
  Filled 2017-01-30: qty 10

## 2017-01-30 MED ORDER — PROCHLORPERAZINE MALEATE 10 MG PO TABS: 10.0000 mg | ORAL_TABLET | Freq: Four times a day (QID) | ORAL | 2 refills | Status: DC | PRN

## 2017-01-30 MED ORDER — HEPARIN SOD (PORK) LOCK FLUSH 100 UNIT/ML IV SOLN
INTRAVENOUS | Status: AC
  Filled 2017-01-30: qty 5

## 2017-01-30 NOTE — Patient Instructions (Signed)
[facility] Discharge Instructions  RECOMMENDATIONS MADE BY THE CONSULTANT AND ANY TEST RESULTS WILL BE SENT TO YOUR REFERRING PHYSICIAN.  Portacath flushed per protocol today. Follow-up as scheduled. Call clinic for any questions or concerns  Thank you for choosing [facility] at [facility] to provide your oncology and hematology care.  To afford each patient quality time with our provider, please arrive at least 15 minutes before your scheduled appointment time.    If you have a lab appointment with the Cancer Center please come in thru the  Main Entrance and check in at the main information desk  You need to re-schedule your appointment should you arrive 10 or more minutes late.  We strive to give you quality time with our providers, and arriving late affects you and other patients whose appointments are after yours.  Also, if you no show three or more times for appointments you may be dismissed from the clinic at the providers discretion.     Again, thank you for choosing  Cancer Center.  Our hope is that these requests will decrease the amount of time that you wait before being seen by our physicians.       _____________________________________________________________  Should you have questions after your visit to  Cancer Center, please contact our office at [phone] between the hours of 8:30 a.m. and 4:30 p.m.  Voicemails left after 4:30 p.m. will not be returned until the following business day.  For prescription refill requests, have your pharmacy contact our office.       Resources For Cancer Patients and their Caregivers ? American Cancer Society: Can assist with transportation, wigs, general needs, runs Look Good Feel Better.        1-888-227-6333 ? Cancer Care: Provides financial assistance, online support groups, medication/co-pay assistance.  1-800-813-HOPE (4673) ? [facility] Assists [place] cancer patients and their families through emotional , educational and financial support.  336-427-4357 ? [place] DSS Where to apply for food stamps, Medicaid and utility assistance. 336-342-1394 ? [facility]: Transportation to medical appointments. 336-347-2287 ? Social Security Administration: May apply for disability if have a Stage IV cancer. 336-342-7796 1-800-772-1213 ? [place] Aging, Disability and Transit Services: Assists with nutrition, care and transit needs. 336-349-2343  Cancer Center Support Programs: [email or web address]@ > Cancer Support Group  2nd Tuesday of the month 1pm-2pm, Journey Room  > Creative Journey  3rd Tuesday of the month 1130am-1pm, Journey Room  > Look Good Feel Better  1st Wednesday of the month 10am-12 noon, Journey Room (Call American Cancer Society to register 1-800-395-5775)   

## 2017-01-30 NOTE — Progress Notes (Signed)
Tracy Valentine tolerated port flush well without complaints or incident. Port accessed with 20 gauge needle with good blood return noted then flushed with 10 ml NS and 5 ml Heparin easily per protocol. VSS Pt discharged self ambulatory in satisfactory condition

## 2017-01-30 NOTE — Progress Notes (Signed)
[place] Note Clinical Social Work  Clinical Social Work was referred by patient navigator to follow up with pt at [facility] for re-assessment of psychosocial needs due to financial concerns and check in. Clinical Social Worker reviewed current concerns with pt. She brought her large folder of medical bills to clinic today. CSW reviewed assistance options and application process again for [employer] [place], [place] and [facility]. Pt has been to [employer] and received some assistance. Pt plans to complete other applications in near future and CSW offered to meet with pt on another day to assist in completing applications. Pt will attempt to complete and gather needed documents for applications. CSW will complete medical referral form as needed for [employer] [place] and fax to their foundation. Pt agrees to contact CSW as needed for follow up on assistance applications. Pt is adjusting to being almost complete with cancer treatment. CSW reviewed common emotions, coping techniques and resources as pt moves into this next phase of her cancer journey. to offer support and assess for needs.      Clinical Social Work interventions: Education and referral Supportive listening   Loren Racer, [place], OSW-C [place] Tuesdays   [phone]

## 2017-01-30 NOTE — Patient Instructions (Signed)
Shoulder Flexion      Clasp hands together and raise arms above head, keeping elbows as straight as possible.  For the first week, ONLY go to shoulder height.  After the first week, you can go as high as is comfortable. Hold 3 seconds. Can be done sitting or lying. Do 5 repetitions, 2 times per day.   Scapular Retraction: Elbow Flexion (Standing)     With elbows bent to 90, pinch shoulder blades together and rotate arms out, keeping elbows bent. Hold 3 seconds. Do 5 repetitions, 2 times per day.      CHEST: Hands Behind Head     Place both hands behind head.  Press elbows backward so that you feel a stretch. Hold 3 seconds. Do 5 repetitions, 2 times per day.     Copyright  VHI. All rights reserved.

## 2017-01-30 NOTE — Therapy (Signed)
[place], [place], [address] Phone: [phone]   Fax:  [phone]  Occupational Therapy Evaluation  Patient Details  Name: Tracy Valentine MRN: [id number] Date of Birth: 1955/11/05 Referring Provider: Dr. Twana First  Encounter Date: 01/30/2017      OT End of Session - 01/30/17 1119    Visit Number 1   Number of Visits 1   Authorization Type [employer] medicare $40 co-pay   Authorization Time Period before 10th visit   Authorization - Visit Number 1   Authorization - Number of Visits 10   OT Start Time 0911   OT Stop Time 0935   OT Time Calculation (min) 24 min   Activity Tolerance Patient tolerated treatment well   Behavior During Therapy [facility] for tasks assessed/performed      Past Medical History:  Diagnosis Date  . Anxiety   . Breast cancer (HCC)    breast  . Chronic pain   . DDD (degenerative disc disease), cervical   . DDD (degenerative disc disease), lumbar   . Degenerative disc disease at L5-S1 level   . Depression   . GERD (gastroesophageal reflux disease)   . History of kidney stones   . Hyperlipidemia   . Hypothyroidism   . Obesity   . Osteoarthritis   . PONV (postoperative nausea and vomiting)   . Sleep apnea    uses CPAP  . Spinal stenosis   . Spinal stenosis     Past Surgical History:  Procedure Laterality Date  . ABDOMINAL HYSTERECTOMY     partial hyst, still have ovaries  . BREAST LUMPECTOMY WITH RADIOACTIVE SEED AND SENTINEL LYMPH NODE BIOPSY Left 08/24/2016   Procedure: LEFT BREAST PARTIAL MASTECTOMY WITH RADIOACTIVE SEED AND LEFT SENTINEL LYMPH NODE MAPPING;  Surgeon: Erroll Luna, MD;  Location: [place];  Service: General;  Laterality: Left;  . CHOLECYSTECTOMY    . FOOT FOREIGN BODY REMOVAL Left   . PORTACATH PLACEMENT Right 09/29/2016   Procedure: INSERTION PORT-A-CATH RIGHT SUBCLAVIAN;  Surgeon: Aviva Signs, MD;  Location[facility];  Service: General;   Laterality: Right;    There were no vitals filed for this visit.      Subjective Assessment - 01/30/17 1104    Subjective  S: I did have issues with my left arm when I was receiving Chemo, but I really worked on it and I don't have any problems with it now.   Pertinent History Patient was diagnosed on 07/28/16 with left upper-outer quadrant breast cancer. ER+ PR- Her2-; Ki67:70%.    Patient Stated Goals To reduce lymphedema risk    Currently in Pain? Yes   Pain Score 6    Pain Location Back   Pain Orientation Lower   Pain Descriptors / Indicators Stabbing   Pain Type Chronic pain   Pain Radiating Towards N/A   Pain Onset Other (comment)  Chronic issue   Pain Frequency Constant           [facility] OT Assessment - 01/30/17 1107      Assessment   Diagnosis Left Breast cancer   Referring Provider Dr. Twana First   Onset Date 07/28/16   Prior Therapy None     Precautions   Precautions Other (comment)   Precaution Comments Active cancer     Restrictions   Weight Bearing Restrictions No     Balance Screen   Has the patient fallen in the past 6 months No  Home  Environment   Family/patient expects to be discharged to: Private residence   Living Arrangements Spouse/significant other   Available Help at Discharge Family     Prior Function   Level of [place] On disability   Leisure patient walks for 15 minutes 3x/week.     Mobility   Mobility Status Independent     Written Expression   Dominant Hand Right     Vision - History   Baseline Vision No visual deficits     Cognition   Overall Cognitive Status Within Functional Limits for tasks assessed     Observation/Other Assessments   Observations Functional 30 second sit to stand test: 10X     ROM / Strength   AROM / PROM / Strength AROM;Strength     AROM   Overall AROM  Within functional limits for tasks performed   Overall AROM Comments Bilateral shoulders and cervical. Patient  shows 50% restriction with right rotation.      Strength   Strength Assessment Site Shoulder   Right/Left Shoulder Left;Right   Right Shoulder Flexion 5/5   Right Shoulder ABduction 5/5   Right Shoulder Internal Rotation 5/5   Right Shoulder External Rotation 5/5   Left Shoulder Flexion 5/5   Left Shoulder ABduction 5/5   Left Shoulder Internal Rotation 5/5   Left Shoulder External Rotation 5/5          LYMPHEDEMA/ONCOLOGY QUESTIONNAIRE - 01/30/17 1109      Type   Cancer Type Left Breast Cancer     Surgeries   Lumpectomy Date 08/24/16   Sentinel Lymph Node Biopsy Date 08/24/16     Date Lymphedema/Swelling Started   Date --  pt reports she noticed swelling after 1st radiation tx.     Treatment   Past Chemotherapy Treatment Yes   Active Radiation Treatment Yes     What other symptoms do you have   Are you Having Heaviness or Tightness Yes   Are you having Pain No   Are you having pitting edema No   Is it Hard or Difficult finding clothes that fit No   Do you have infections No     Lymphedema Assessments   Lymphedema Assessments Upper extremities     Right Upper Extremity Lymphedema   10 cm Proximal to Olecranon Process 32.5 cm   Olecranon Process 38.5 cm   10 cm Proximal to Ulnar Styloid Process 22 cm   Just Proximal to Ulnar Styloid Process 17.5 cm   Across Hand at [employer] 19 cm   At [place] of 2nd Digit 7 cm     Left Upper Extremity Lymphedema   10 cm Proximal to Olecranon Process 32 cm   Olecranon Process 26 cm   10 cm Proximal to Ulnar Styloid Process 23.5 cm   Just Proximal to Ulnar Styloid Process 18.5 cm   Across Hand at [employer] 18.5 cm   At [place] of 2nd Digit 7 cm      Patient was instructed today in home exercise program today for shoulder range of motion. These included active assisted shoulder flexion in sitting, scapular retraction,  and hands behind head external rotation. They were encouraged to do these twice a day, holding 3  seconds and repeating 5 times.                   OT Education - 01/30/17 1118    Education Details Lymphedema risk reduction and shoulder exercises  HEP   Person(s) Educated Patient   Methods Explanation;Demonstration;Verbal cues;Handout   Comprehension Verbalized understanding          OT Short Term Goals - 02-15-17 1122      OT SHORT TERM GOAL #1   Title Patient will be able to verbalize understanding of pertinent lymphedema risk reduction practices relevant to her diagnosis specificially related to skin care.    Time 1   Period Days   Status Achieved     OT SHORT TERM GOAL #2   Title patient will be able to verbalized understanding of shoulder exercises related to maintaining shoulder ROM.   Time 1   Period Days   Status Achieved                  Plan - 02-15-2017 1120    Clinical Impression Statement patient was diagnosed on 07/28/06 with left breast cancer upper-outer quadrant. It is ER+, PR-, Her2- with a Ki67: 70%. Her multidisciplinary team met prior to clinic to discuss current treatment plan. Pt currently is presenting with lymphedema noted in left UE primarily from wrist to elbow. Pt was educated on ways to reduce the risk of lymphedema. Education provided on lymphedema ways to management as well as the importance of seeking out treatment as soon as radiation is complete.    Rehab Potential Excellent   OT Frequency One time visit   OT Treatment/Interventions Patient/family education   Plan P: Follow up with Lymphedema therapist in OP when finished with radiation.    Consulted and Agree with Plan of Care Patient      Patient will benefit from skilled therapeutic intervention in order to improve the following deficits and impairments:  Increased edema  Visit Diagnosis: Other symptoms and signs involving the musculoskeletal system  Malignant neoplasm of upper-outer quadrant of left breast in female, estrogen receptor positive ([place])   Patient  will follow up at outpatient cancer clinic rehab for lymphedema management. If the patient requires physical/occupational therapy at that time, a specific plan will be dictated and set to the referring physician for approval. Patient was educated today on lymphedema risk reduction practices as it pertains to recommendations that will benefit the patient. She verbalized good understanding. No additional occupational therapy is indicated at this time.      G-Codes - 2017/02/15 1125    Functional Assessment Tool Used (Outpatient only) clinical judgement   Functional Limitation Self care   Self Care Current Status [phone]) At least 1 percent but less than 20 percent impaired, limited or restricted   Self Care Goal Status (X6418) At least 1 percent but less than 20 percent impaired, limited or restricted   Self Care Discharge Status [id number]) At least 1 percent but less than 20 percent impaired, limited or restricted      Problem List Patient Active Problem List   Diagnosis Date Noted  . Dysuria 10/09/2016  . Nausea without vomiting 10/09/2016  . Skin infection 10/09/2016  . Breast cancer of upper-outer quadrant of left female breast ([place]) 08/27/2016  . HTN (hypertension) 07/25/2013  . Hypothyroidism 07/25/2013  . Anxiety 07/25/2013  . Chest pain 07/25/2013   Ailene Ravel, OTR/L,CBIS  [id number]  February 15, 2017, 11:30 AM  [place] [address] [place], [place], [address] Phone: [phone]   Fax:  [phone]  Name: Tracy Valentine MRN: [id number] Date of Birth: [birth year]

## 2017-01-30 NOTE — Telephone Encounter (Signed)
Received refill request from patients pharmacy for Zofran and compazine. Reviewed with PA-C,chart checked and refilled.

## 2017-01-31 ENCOUNTER — Other Ambulatory Visit (HOSPITAL_COMMUNITY): Payer: Self-pay | Admitting: Emergency Medicine

## 2017-01-31 DIAGNOSIS — C50412 Malignant neoplasm of upper-outer quadrant of left female breast: Secondary | ICD-10-CM | POA: Diagnosis not present

## 2017-01-31 DIAGNOSIS — I890 Lymphedema, not elsewhere classified: Secondary | ICD-10-CM

## 2017-02-14 ENCOUNTER — Ambulatory Visit (HOSPITAL_COMMUNITY)
Admission: RE | Admit: 2017-02-14 | Discharge: 2017-02-14 | Disposition: A | Discharge status: Home / Self Care | Payer: Medicare Other | Source: Ambulatory Visit | Attending: Hematology | Admitting: Hematology

## 2017-02-14 DIAGNOSIS — E2839 Other primary ovarian failure: Secondary | ICD-10-CM | POA: Diagnosis not present

## 2017-02-14 DIAGNOSIS — C50412 Malignant neoplasm of upper-outer quadrant of left female breast: Secondary | ICD-10-CM

## 2017-02-19 ENCOUNTER — Encounter (HOSPITAL_COMMUNITY): Payer: Self-pay

## 2017-02-19 ENCOUNTER — Encounter (HOSPITAL_BASED_OUTPATIENT_CLINIC_OR_DEPARTMENT_OTHER): Payer: Medicare Other | Admitting: Oncology

## 2017-02-19 VITALS — BP 122/67 | HR 58 | Temp 98.2°F | Resp 18 | Wt 249.8 lb

## 2017-02-19 DIAGNOSIS — Z923 Personal history of irradiation: Secondary | ICD-10-CM | POA: Diagnosis not present

## 2017-02-19 DIAGNOSIS — C50412 Malignant neoplasm of upper-outer quadrant of left female breast: Secondary | ICD-10-CM

## 2017-02-19 DIAGNOSIS — Z170 Estrogen receptor positive status [ER+]: Secondary | ICD-10-CM | POA: Diagnosis not present

## 2017-02-19 MED ORDER — ANASTROZOLE 1 MG PO TABS: 1.0000 mg | ORAL_TABLET | Freq: Every day | ORAL | 2 refills | Status: DC

## 2017-02-19 MED ORDER — CALCIUM CARBONATE-VITAMIN D 500-200 MG-UNIT PO TABS: 1.0000 | ORAL_TABLET | Freq: Two times a day (BID) | ORAL | 10 refills | Status: DC

## 2017-02-19 NOTE — Progress Notes (Signed)
[name]  HEMATOLOGY ONCOLOGY PROGRESS NOTE  Date of service: .01/10/2017  Patient Care Team: Redmond [employer], MD as PCP - General (Internal Medicine)  CC: f/u for left breast cancer ER positive PR negative HER-2/neu negative   SUMMARY OF ONCOLOGIC HISTORY:   Breast cancer of upper-outer quadrant of left female breast ([place])   07/26/2016 Mammogram    Area of developing asymmetry with distortion located within the upper-outer quadrant left breast. Tissue sampling via tomosynthesis guided biopsy is recommended and will be scheduled.  RECOMMENDATION: Left breast tomosynthesis guided biopsy. This has been scheduled for 07/28/2016.       07/28/2016 Initial Biopsy    Coil shaped clip corresponds to the asymmetry in the outer left breast. Clip is well positioned at the biopsy site. 2. X shaped clip corresponds to the ASYMMETRY/DISTORTION in the upper-outer quadrant of the left breast. Clip is positioned approximately 1 cm medial to the center of the biopsy cavity      07/28/2016 Pathology Results    Estrogen Receptor: 80%, POSITIVE, MODERATE STAINING INTENSITY Progesterone Receptor: 0%, NEGATIVE Proliferation Marker Ki67: 70% HER 2 negative by FISH Breast, left, needle core biopsy, upper outer quadrant - INVASIVE DUCTAL CARCINOMA, SEE COMMENT. - HIGH GRADE DUCTAL CARCINOMA IN SITU WITH NECROSIS.      08/24/2016 Oncotype testing    Recurrence Score Result 30, 10 year risk of distant recurrence Tamoxifen alone 20%      10/03/2016 -  Chemotherapy    The patient had palonosetron (ALOXI) injection 0.25 mg, 0.25 mg, Intravenous,  Once, 2 of 4 cycles Administration: 0.25 mg (10/24/2016)  pegfilgrastim (NEULASTA ONPRO KIT) injection 6 mg, 6 mg, Subcutaneous, Once, 2 of 4 cycles Administration: 6 mg (10/24/2016)  cyclophosphamide (CYTOXAN) 1,380 mg in sodium chloride 0.9 % 250 mL chemo infusion, 600 mg/m2 = 1,380 mg, Intravenous,  Once, 2 of 4 cycles Administration: 1,380 mg  (10/24/2016)  DOCEtaxel (TAXOTERE) 170 mg in dextrose 5 % 250 mL chemo infusion, 75 mg/m2 = 170 mg, Intravenous,  Once, 2 of 4 cycles Dose modification: 65 mg/m2 (original dose 75 mg/m2, Cycle 2, Reason: Dose not tolerated)  fosaprepitant (EMEND) 150 mg, dexamethasone (DECADRON) 12 mg in sodium chloride 0.9 % 145 mL IVPB, , Intravenous,  Once, 1 of 3 cycles Administration:  (10/24/2016)  for chemotherapy treatment.         INTERVAL HISTORY:  Tracy Valentine 61 y.o. female is here for follow up of left breast cancer ER+/PR-/HER2-. Ki67 is 70%.  She has completed her 4 cycles of adjuvant TC for high-grade early stage breast cancer with a high Oncotype DX score and high grade DCIS.   Tracy Valentine presents today for continuing follow up.   She reports severe radiation burning under her left breast. She notes that today is the first day she was able to wear a bra. She notes it is getting better now. She finished her radiation on 02/06/2017. She sees her radiation doctor on May 8th.   Patient does not have any other questions or concerns at this time.    REVIEW OF SYSTEMS:   Review of Systems  Constitutional: Negative.   HENT: Negative.   Eyes: Negative.   Respiratory: Negative.   Cardiovascular: Negative.   Gastrointestinal: Negative.   Genitourinary: Negative.   Musculoskeletal: Negative.   Skin: Negative.        radiation burns under left breast  Neurological: Negative.   Endo/Heme/Allergies: Negative.   Psychiatric/Behavioral: Negative.   All other systems reviewed and are negative.  14 Point review of systems of done and is negative except as noted above.  . Past Medical History:  Diagnosis Date  . Anxiety   . Breast cancer (HCC)    breast  . Chronic pain   . DDD (degenerative disc disease), cervical   . DDD (degenerative disc disease), lumbar   . Degenerative disc disease at L5-S1 level   . Depression   . GERD (gastroesophageal reflux disease)   . History of  kidney stones   . Hyperlipidemia   . Hypothyroidism   . Obesity   . Osteoarthritis   . PONV (postoperative nausea and vomiting)   . Sleep apnea    uses CPAP  . Spinal stenosis   . Spinal stenosis     . Past Surgical History:  Procedure Laterality Date  . ABDOMINAL HYSTERECTOMY     partial hyst, still have ovaries  . BREAST LUMPECTOMY WITH RADIOACTIVE SEED AND SENTINEL LYMPH NODE BIOPSY Left 08/24/2016   Procedure: LEFT BREAST PARTIAL MASTECTOMY WITH RADIOACTIVE SEED AND LEFT SENTINEL LYMPH NODE MAPPING;  Surgeon: Erroll Luna, MD;  Location: [facility];  Service: General;  Laterality: Left;  . CHOLECYSTECTOMY    . FOOT FOREIGN BODY REMOVAL Left   . PORTACATH PLACEMENT Right 09/29/2016   Procedure: INSERTION PORT-A-CATH RIGHT SUBCLAVIAN;  Surgeon: Aviva Signs, MD;  Location: [facility];  Service: General;  Laterality: Right;    . Social History  Substance Use Topics  . Smoking status: Never Smoker  . Smokeless tobacco: Never Used  . Alcohol use No    ALLERGIES:  is allergic to adhesive [tape]; amoxicillin; darvocet [propoxyphene n-acetaminophen]; doxycycline; erythromycin; shellfish allergy; zithromax [azithromycin]; and latex.  MEDICATIONS:  Current Outpatient Prescriptions  Medication Sig Dispense Refill  . ALPRAZolam (XANAX) 1 MG tablet Take 1 mg by mouth 4 (four) times daily as needed for sleep.    [name] Kitchen aspirin 325 MG tablet Take 325 mg by mouth daily.    [name] Kitchen docusate sodium (COLACE) 50 MG capsule Take 100 mg by mouth 2 (two) times daily.    . fluticasone (FLONASE) 50 MCG/ACT nasal spray Place 1 spray into both nostrils daily.    [name] Kitchen ipratropium (ATROVENT) 0.03 % nasal spray 1 spray daily.  4  . KRILL OIL PO Take 1 capsule by mouth daily.    [name] Kitchen levothyroxine (SYNTHROID, LEVOTHROID) 100 MCG tablet Take 100 mcg by mouth daily.  5  . lidocaine-prilocaine (EMLA) cream Apply a quarter size amount to port site 1 hour prior to chemo. Do not rub in. Cover with plastic  wrap. 30 g 3  . montelukast (SINGULAIR) 10 MG tablet Take 10 mg by mouth daily.  11  . ondansetron (ZOFRAN) 8 MG tablet Take 1 tablet (8 mg total) by mouth every 8 (eight) hours as needed for nausea or vomiting. 30 tablet 2  . Oxycodone HCl 10 MG TABS   0  . senna (SENOKOT) 8.6 MG TABS tablet Take 1 tablet by mouth at bedtime as needed.    . simvastatin (ZOCOR) 20 MG tablet Take 20 mg by mouth every evening.    . venlafaxine (EFFEXOR) 75 MG tablet Take 75 mg by mouth 2 (two) times daily.    [name] Kitchen zolpidem (AMBIEN) 10 MG tablet Take 10 mg by mouth at bedtime.    [name] Kitchen anastrozole (ARIMIDEX) 1 MG tablet Take 1 tablet (1 mg total) by mouth daily. 90 tablet 2  . calcium-vitamin D (OSCAL WITH D) 500-200 MG-UNIT tablet Take 1 tablet by mouth 2 (  two) times daily. 60 tablet 10   No current facility-administered medications for this visit.     PHYSICAL EXAMINATION: ECOG PERFORMANCE STATUS: 1 - Symptomatic but completely ambulatory  Vitals:   02/19/17 1512  BP: 122/67  Pulse: (!) 58  Resp: 18  Temp: 98.2 F (36.8 C)   Filed Weights   02/19/17 1512  Weight: 249 lb 12.8 oz (113.3 kg)     Physical Exam  Constitutional: She is oriented to person, place, and time and well-developed, well-nourished, and in no distress.  HENT:  Head: Normocephalic and atraumatic.  Eyes: Conjunctivae and EOM are normal. Pupils are equal, round, and reactive to light.  Neck: Normal range of motion. Neck supple.  Cardiovascular: Normal rate, regular rhythm and normal heart sounds.   Pulmonary/Chest: Effort normal and breath sounds normal.  Abdominal: Soft. Bowel sounds are normal.  Musculoskeletal: Normal range of motion.  Neurological: She is alert and oriented to person, place, and time. Gait normal.  Skin: Skin is warm and dry.  Nursing note and vitals reviewed.   LABORATORY DATA:   I have reviewed the data as listed  . CBC Latest Ref Rng & Units 12/05/2016 11/14/2016 10/24/2016  WBC 4.0 - 10.5 K/uL 16.5(H)  18.1(H) 14.0(H)  Hemoglobin 12.0 - 15.0 g/dL 12.1 12.1 13.1  Hematocrit 36.0 - 46.0 % 36.6 36.4 39.3  Platelets 150 - 400 K/uL 275 234 398    . CMP Latest Ref Rng & Units 12/05/2016 11/14/2016 10/24/2016  Glucose 65 - 99 mg/dL 149(H) 138(H) 132(H)  BUN 6 - 20 mg/dL '12 14 14  ' Creatinine 0.44 - 1.00 mg/dL 0.78 0.69 0.74  Sodium 135 - 145 mmol/L 138 137 135  Potassium 3.5 - 5.1 mmol/L 3.8 4.1 4.2  Chloride 101 - 111 mmol/L 102 105 102  CO2 22 - 32 mmol/L '27 25 27  ' Calcium 8.9 - 10.3 mg/dL 10.2 9.6 9.9  Total Protein 6.5 - 8.1 g/dL 6.7 6.4(L) 7.3  Total Bilirubin 0.3 - 1.2 mg/dL 0.5 0.4 0.4  Alkaline Phos 38 - 126 U/L 69 65 103  AST 15 - 41 U/L '20 19 23  ' ALT 14 - 54 U/L '18 20 24     ' RADIOGRAPHIC STUDIES: I have personally reviewed the radiological images as listed and agreed with the findings in the report.  DUAL X-RAY ABSORPTIOMETRY (DXA) FOR BONE MINERAL DENSITY 02/14/2017  IMPRESSION: Ordering Physician:  Dr. Brunetta Genera,  Your patient Shenna Brissette completed a BMD test on 02/14/2017 using the [place] (software version: 14.10) manufactured by [employer]. The following summarizes the results of our evaluation. PATIENT BIOGRAPHICAL: Name: NIKOLETTA, VARMA Patient ID: [id number] Birth Date: August 12, 1956 Height: 66.0 in. Gender: Female Exam Date: 02/14/2017 Weight: 249.0 lbs. Indications: Breast Ca, Caucasian, Height Loss, History of Fracture (Adult), Partial Hysterectomy, Post Menopausal, Vitamin D Deficiency Fractures: Ankle Treatments: Asprin, Synthroid, Vitamin D DENSITOMETRY RESULTS: Site         Region     Measured Date Measured Age WHO Classification Young Adult T-score BMD         %Change vs. Previous Significant Change (*) DualFemur Neck Left 4/14/2017 58.6 Normal -0.2 1.005 g/cm2 - -  Left Forearm Radius 33% 4/14/2017 58.6 Normal 1.1 0.795 g/cm2 - - ASSESSMENT: BMD as determined from Femur Neck Left is 1.005 g/cm2 with a  T-Score of -0.2. This patient is considered normal according to [facility]) criteria. (Lumbar spine was not utilized due to advanced degenerative changes.)  World Pharmacologist (WHO) criteria for post-menopausal, Caucasian Women: Normal:       T-score at or above -1 SD Osteopenia:   T-score between -1 and -2.5 SD Osteoporosis: T-score at or below -2.5 SD     ASSESSMENT & PLAN:   61 year old female with   1) Early Stage pT1C,pN0, Mx (Stage IA) with high-grade ER positive PR negative HER-2/neu negative breast cancer status post left breast lumpectomy, adjuvant chemotherapy with TCx4 cycles. High-grade DCIS Oncotype recurrence score of 30  PLAN: - Discussed starting anastrozole and its side effects in detail with the patient today. I explained the side effects of Arimidex in detail including hot flashes,arthralgias, risk for decrease in bone density, etc. She is willing to proceed. Rx sent into pharmacy. - Reviewed DEXA scan results in detail with the patient. She has been prescribed calcium-vitamin D to take along with her AI. - I have sent out for breast cancer index (BCI) testing to determine if she might be candidate for extended hormonal therapy.  RTC in 6 weeks for follow up to assess tolerability to AI. She will have her port flushed on her next visit and then 8 weeks thereafter.    This document serves as a record of services personally performed by Twana First, MD. It was created on her behalf by Shirlean Mylar, a trained medical scribe. The creation of this record is based on the scribe's personal observations and the provider's statements to them. This document has been checked and approved by the attending provider.  I have reviewed the above documentation for accuracy and completeness and I agree with the above.

## 2017-02-19 NOTE — Patient Instructions (Addendum)
[facility] at [facility] Discharge Instructions  RECOMMENDATIONS MADE BY THE CONSULTANT AND ANY TEST RESULTS WILL BE SENT TO YOUR REFERRING PHYSICIAN.  You were seen today by Dr. Twana First Begin taking Arimidex 1 mg daily and Oscal + D 500 mg-200 mg twice daily Follow up in 6 weeks Port flush on next visit  Thank you for choosing [facility] at [facility] to provide your oncology and hematology care.  To afford each patient quality time with our provider, please arrive at least 15 minutes before your scheduled appointment time.    If you have a lab appointment with the [facility] please come in thru the  Main Entrance and check in at the main information desk  You need to re-schedule your appointment should you arrive 10 or more minutes late.  We strive to give you quality time with our providers, and arriving late affects you and other patients whose appointments are after yours.  Also, if you no show three or more times for appointments you may be dismissed from the clinic at the providers discretion.     Again, thank you for choosing [facility].  Our hope is that these requests will decrease the amount of time that you wait before being seen by our physicians.       _____________________________________________________________  Should you have questions after your visit to [facility], please contact our office at [phone] between the hours of 8:30 a.m. and 4:30 p.m.  Voicemails left after 4:30 p.m. will not be returned until the following business day.  For prescription refill requests, have your pharmacy contact our office.       Resources For Cancer Patients and their Caregivers ? American Cancer Society: Can assist with transportation, wigs, general needs, runs Look Good Feel Better.        ([phone] ? Cancer Care: Provides financial assistance, online support groups, medication/co-pay  assistance.  1-800-813-HOPE 4785179917) ? [facility] Assists [place] cancer patients and their families through emotional , educational and financial support.  9106247161 ? [place] DSS Where to apply for food stamps, Medicaid and utility assistance. 575-027-1209 ? [facility]: Transportation to medical appointments. 214-466-2192 ? Social Security Administration: May apply for disability if have a Stage IV cancer. 831-680-5645 808-141-4639 ? [employer], Disability and Transit Services: Assists with nutrition, care and transit needs. [place] Support Programs: @10RELATIVEDAYS @ > Cancer Support Group  2nd Tuesday of the month 1pm-2pm, Journey Room  > Creative Journey  3rd Tuesday of the month 1130am-1pm, Journey Room  > Look Good Feel Better  1st Wednesday of the month 10am-12 noon, Journey Room (Call [place] to register [phone])

## 2017-02-20 ENCOUNTER — Encounter: Payer: Self-pay | Admitting: *Deleted

## 2017-02-20 ENCOUNTER — Encounter (HOSPITAL_COMMUNITY): Payer: Self-pay | Admitting: Emergency Medicine

## 2017-02-20 NOTE — Progress Notes (Signed)
[place] Clinical Social Work  Clinical Social Work was referred by patient navigator for assessment of psychosocial needs due to financial concerns. Clinical Social Worker contacted patient at home to offer support and assess for needs. CSW inquired if pt had completed application for [employer] to date. Pt reports she sent it in recently to the organization. CSW contacted [employer] in [place] on behalf of pt and CSW awaits their follow up. CSW educated pt that since she had completed treatment that she may be denied assistance. CSW first reviewed their assistance guidelines for pt back in December. Most organizations will only assist when pts are in "active treatment" which is chemo or radiation. CSW reminded pt of these guide lines again today. CSW is unaware of additional assistance options to assist pt at this time. CSW encouraged pt to reach out to main financial counseling department for additional guidance.     Clinical Social Work interventions: Resource education  Loren Racer, LCSW, OSW-C [place] Tuesdays   [phone]

## 2017-02-21 NOTE — Progress Notes (Signed)
BCI sent.  Faxed confirmed from [facility],

## 2017-02-27 ENCOUNTER — Telehealth (HOSPITAL_COMMUNITY): Payer: Self-pay | Admitting: *Deleted

## 2017-02-27 ENCOUNTER — Other Ambulatory Visit (HOSPITAL_COMMUNITY): Payer: Self-pay | Admitting: Emergency Medicine

## 2017-02-27 NOTE — Progress Notes (Signed)
Pt called to question wether she could take oyster shell calcium if she was allergic to shell fish.  Spoke with Alvester Chou our pharmacist and he said it was different but if she was worried she could use calcium carbonate instead.  Nechelle verbalized understanding.

## 2017-03-05 DIAGNOSIS — M47816 Spondylosis without myelopathy or radiculopathy, lumbar region: Secondary | ICD-10-CM | POA: Diagnosis not present

## 2017-03-05 DIAGNOSIS — G894 Chronic pain syndrome: Secondary | ICD-10-CM | POA: Diagnosis not present

## 2017-03-05 DIAGNOSIS — M5416 Radiculopathy, lumbar region: Secondary | ICD-10-CM | POA: Diagnosis not present

## 2017-03-05 DIAGNOSIS — M47812 Spondylosis without myelopathy or radiculopathy, cervical region: Secondary | ICD-10-CM | POA: Diagnosis not present

## 2017-03-07 ENCOUNTER — Encounter (HOSPITAL_COMMUNITY): Payer: Self-pay

## 2017-03-20 ENCOUNTER — Encounter: Payer: Self-pay | Admitting: *Deleted

## 2017-03-20 NOTE — Progress Notes (Signed)
[place] Clinical Social Work  Clinical Social Work was referred by patient for assistance/follow up on Child psychotherapist. Clinical Social Worker spoke with patient via phone to offer support and assess for needs. Pt concerned she had not heard back from [employer] [facility] [place] to date on her assistance application. CSW reminded pt that she applied at the end of her treatment and for this agency they prefer you apply for assistance at the beginning of treatment. CSW emailed [employer] [facility] [place] on behalf of pt to inquire on the status of her application. CSW inquired if pt had contacted [facility] to date to apply and pt states she forgot about that organization and will contact them as well. CSW to contact pt if there are any updates from [facility] in [place]. Pt stated understanding.    Clinical Social Work interventions: Resource education  Loren Racer, LCSW, OSW-C [place] Tuesdays   [phone]

## 2017-03-27 ENCOUNTER — Encounter: Payer: Self-pay | Admitting: *Deleted

## 2017-03-27 ENCOUNTER — Encounter (HOSPITAL_BASED_OUTPATIENT_CLINIC_OR_DEPARTMENT_OTHER): Payer: Medicare Other | Admitting: Oncology

## 2017-03-27 ENCOUNTER — Encounter (HOSPITAL_COMMUNITY): Payer: Medicare Other | Attending: Hematology & Oncology

## 2017-03-27 ENCOUNTER — Encounter (HOSPITAL_COMMUNITY): Payer: Self-pay

## 2017-03-27 DIAGNOSIS — Z79811 Long term (current) use of aromatase inhibitors: Secondary | ICD-10-CM

## 2017-03-27 DIAGNOSIS — R300 Dysuria: Secondary | ICD-10-CM | POA: Insufficient documentation

## 2017-03-27 DIAGNOSIS — Z170 Estrogen receptor positive status [ER+]: Secondary | ICD-10-CM | POA: Diagnosis not present

## 2017-03-27 DIAGNOSIS — C50412 Malignant neoplasm of upper-outer quadrant of left female breast: Secondary | ICD-10-CM | POA: Insufficient documentation

## 2017-03-27 DIAGNOSIS — L089 Local infection of the skin and subcutaneous tissue, unspecified: Secondary | ICD-10-CM | POA: Insufficient documentation

## 2017-03-27 DIAGNOSIS — R110 Nausea: Secondary | ICD-10-CM | POA: Insufficient documentation

## 2017-03-27 DIAGNOSIS — B370 Candidal stomatitis: Secondary | ICD-10-CM | POA: Insufficient documentation

## 2017-03-27 DIAGNOSIS — Z95828 Presence of other vascular implants and grafts: Secondary | ICD-10-CM

## 2017-03-27 DIAGNOSIS — E876 Hypokalemia: Secondary | ICD-10-CM | POA: Insufficient documentation

## 2017-03-27 MED ORDER — EPOETIN ALFA 10000 UNIT/ML IJ SOLN
INTRAMUSCULAR | Status: AC
  Filled 2017-03-27: qty 1

## 2017-03-27 MED ORDER — HEPARIN SOD (PORK) LOCK FLUSH 100 UNIT/ML IV SOLN
500.0000 [IU] | Freq: Once | INTRAVENOUS | Status: AC
Start: 2017-03-27 — End: 2017-03-27
  Administered 2017-03-27: 500 [IU] via INTRAVENOUS

## 2017-03-27 MED ORDER — SODIUM CHLORIDE 0.9% FLUSH
10.0000 mL | INTRAVENOUS | Status: DC | PRN
Start: 2017-03-27 — End: 2017-03-27
  Administered 2017-03-27: 10 mL via INTRAVENOUS
  Filled 2017-03-27: qty 10

## 2017-03-27 MED ORDER — HEPARIN SOD (PORK) LOCK FLUSH 100 UNIT/ML IV SOLN
INTRAVENOUS | Status: AC
  Filled 2017-03-27: qty 5

## 2017-03-27 NOTE — Progress Notes (Signed)
Tracy Valentine Kitchen  HEMATOLOGY ONCOLOGY PROGRESS NOTE  Date of service: .01/10/2017  Patient Care Team: [employer], MD as PCP - General (Internal Medicine)  CC: f/u for left breast cancer ER positive PR negative HER-2/neu negative   SUMMARY OF ONCOLOGIC HISTORY:   Breast cancer of upper-outer quadrant of left female breast ([place])   07/26/2016 Mammogram    Area of developing asymmetry with distortion located within the upper-outer quadrant left breast. Tissue sampling via tomosynthesis guided biopsy is recommended and will be scheduled.  RECOMMENDATION: Left breast tomosynthesis guided biopsy. This has been scheduled for 07/28/2016.       07/28/2016 Initial Biopsy    Coil shaped clip corresponds to the asymmetry in the outer left breast. Clip is well positioned at the biopsy site. 2. X shaped clip corresponds to the ASYMMETRY/DISTORTION in the upper-outer quadrant of the left breast. Clip is positioned approximately 1 cm medial to the center of the biopsy cavity      07/28/2016 Pathology Results    Estrogen Receptor: 80%, POSITIVE, MODERATE STAINING INTENSITY Progesterone Receptor: 0%, NEGATIVE Proliferation Marker Ki67: 70% HER 2 negative by FISH Breast, left, needle core biopsy, upper outer quadrant - INVASIVE DUCTAL CARCINOMA, SEE COMMENT. - HIGH GRADE DUCTAL CARCINOMA IN SITU WITH NECROSIS.      08/24/2016 Oncotype testing    Recurrence Score Result 30, 10 year risk of distant recurrence Tamoxifen alone 20%      10/03/2016 - 12/05/2016 Chemotherapy    The patient had palonosetron (ALOXI) injection 0.25 mg, 0.25 mg, Intravenous,  Once, 2 of 4 cycles Administration: 0.25 mg (10/24/2016)  pegfilgrastim (NEULASTA ONPRO KIT) injection 6 mg, 6 mg, Subcutaneous, Once, 2 of 4 cycles Administration: 6 mg (10/24/2016)  cyclophosphamide (CYTOXAN) 1,380 mg in sodium chloride 0.9 % 250 mL chemo infusion, 600 mg/m2 = 1,380 mg, Intravenous,  Once, 2 of 4 cycles Administration: 1,380 mg  (10/24/2016)  DOCEtaxel (TAXOTERE) 170 mg in dextrose 5 % 250 mL chemo infusion, 75 mg/m2 = 170 mg, Intravenous,  Once, 2 of 4 cycles Dose modification: 65 mg/m2 (original dose 75 mg/m2, Cycle 2, Reason: Dose not tolerated)  fosaprepitant (EMEND) 150 mg, dexamethasone (DECADRON) 12 mg in sodium chloride 0.9 % 145 mL IVPB, , Intravenous,  Once, 1 of 3 cycles Administration:  (10/24/2016)  for chemotherapy treatment.         - 02/06/2017 Radiation Therapy    Completed adjuvant breast RT       02/19/2017 -  Anti-estrogen oral therapy    Started anastrozole      03/05/2017 Pathology Results    BCI-high risk of late recurrence (years 5-10) at 10.6% with a high likelihood of benefit from extended endocrine therapy.  High overall risk of recurrence (years 0-10) 19.5% and a high likelihood of benefit from extended endocrine therapy.       INTERVAL HISTORY:  Tracy Valentine 61 y.o. female is here for follow up of left breast cancer ER+/PR-/HER2-. Ki67 is 70%.  She has completed her 4 cycles of adjuvant TC for high-grade early stage breast cancer with a high Oncotype DX score and high grade DCIS.   Tracy Valentine presents today for continuing follow up.   She started taking her arimidex on 02/26/17 and she's tolerating arimidex well. She denies any hot flashes or arthralgias. She is taking 2 TUMS a day for calcium supplementation and takes vitamin D. Her hair is growing back. She is making efforts to lose weight. She denies any changes in appetite, shortness of breath,  chest pain, abdominal pain, focal weakness.    REVIEW OF SYSTEMS:   Review of Systems  Constitutional: Negative.  Negative for chills and fever.  HENT: Negative.  Negative for hearing loss, sore throat and tinnitus.   Eyes: Negative.  Negative for blurred vision, photophobia and discharge.  Respiratory: Negative.  Negative for cough, hemoptysis, shortness of breath and wheezing.   Cardiovascular: Negative.  Negative for chest  pain, palpitations, orthopnea, claudication and leg swelling.  Gastrointestinal: Negative.  Negative for abdominal pain, constipation, diarrhea, melena, nausea and vomiting.  Genitourinary: Negative.  Negative for dysuria and hematuria.  Musculoskeletal: Negative.  Negative for back pain, joint pain and myalgias.  Skin: Negative.  Negative for itching and rash.       radiation burns under left breast  Neurological: Negative.  Negative for dizziness, weakness and headaches.  Endo/Heme/Allergies: Negative.  Negative for environmental allergies and polydipsia. Does not bruise/bleed easily.  Psychiatric/Behavioral: Negative.  Negative for depression. The patient is not nervous/anxious and does not have insomnia.   All other systems reviewed and are negative. 14 Point review of systems of done and is negative except as noted above.  . Past Medical History:  Diagnosis Date  . Anxiety   . Breast cancer (HCC)    breast  . Chronic pain   . DDD (degenerative disc disease), cervical   . DDD (degenerative disc disease), lumbar   . Degenerative disc disease at L5-S1 level   . Depression   . GERD (gastroesophageal reflux disease)   . History of kidney stones   . Hyperlipidemia   . Hypothyroidism   . Obesity   . Osteoarthritis   . PONV (postoperative nausea and vomiting)   . Sleep apnea    uses CPAP  . Spinal stenosis   . Spinal stenosis     . Past Surgical History:  Procedure Laterality Date  . ABDOMINAL HYSTERECTOMY     partial hyst, still have ovaries  . BREAST LUMPECTOMY WITH RADIOACTIVE SEED AND SENTINEL LYMPH NODE BIOPSY Left 08/24/2016   Procedure: LEFT BREAST PARTIAL MASTECTOMY WITH RADIOACTIVE SEED AND LEFT SENTINEL LYMPH NODE MAPPING;  Surgeon: Erroll Luna, MD;  Location: [facility];  Service: General;  Laterality: Left;  . CHOLECYSTECTOMY    . FOOT FOREIGN BODY REMOVAL Left   . PORTACATH PLACEMENT Right 09/29/2016   Procedure: INSERTION PORT-A-CATH RIGHT  SUBCLAVIAN;  Surgeon: Aviva Signs, MD;  Location: [facility];  Service: General;  Laterality: Right;    . Social History  Substance Use Topics  . Smoking status: Never Smoker  . Smokeless tobacco: Never Used  . Alcohol use No    ALLERGIES:  is allergic to adhesive [tape]; amoxicillin; darvocet [propoxyphene n-acetaminophen]; doxycycline; erythromycin; shellfish allergy; zithromax [azithromycin]; and latex.  MEDICATIONS:  Current Outpatient Prescriptions  Medication Sig Dispense Refill  . ALPRAZolam (XANAX) 1 MG tablet Take 1 mg by mouth 4 (four) times daily as needed for sleep.    Tracy Valentine Kitchen anastrozole (ARIMIDEX) 1 MG tablet Take 1 tablet (1 mg total) by mouth daily. 90 tablet 2  . aspirin 325 MG tablet Take 325 mg by mouth daily.    . calcium carbonate (TUMS - DOSED IN MG ELEMENTAL CALCIUM) 500 MG chewable tablet Chew 2 tablets by mouth 2 (two) times daily.    Tracy Valentine Kitchen docusate sodium (COLACE) 50 MG capsule Take 100 mg by mouth 2 (two) times daily.    . fluticasone (FLONASE) 50 MCG/ACT nasal spray Place 1 spray into both nostrils daily.    Tracy Valentine Kitchen  ipratropium (ATROVENT) 0.03 % nasal spray 1 spray daily.  4  . KRILL OIL PO Take 1 capsule by mouth daily.    Tracy Valentine Kitchen levothyroxine (SYNTHROID, LEVOTHROID) 100 MCG tablet Take 100 mcg by mouth daily.  5  . lidocaine-prilocaine (EMLA) cream Apply a quarter size amount to port site 1 hour prior to chemo. Do not rub in. Cover with plastic wrap. 30 g 3  . montelukast (SINGULAIR) 10 MG tablet Take 10 mg by mouth daily.  11  . ondansetron (ZOFRAN) 8 MG tablet Take 1 tablet (8 mg total) by mouth every 8 (eight) hours as needed for nausea or vomiting. 30 tablet 2  . Oxycodone HCl 10 MG TABS   0  . senna (SENOKOT) 8.6 MG TABS tablet Take 1 tablet by mouth at bedtime as needed.    . simvastatin (ZOCOR) 20 MG tablet Take 20 mg by mouth every evening.    . venlafaxine (EFFEXOR) 75 MG tablet Take 75 mg by mouth 2 (two) times daily.    Tracy Valentine Kitchen zolpidem (AMBIEN) 10 MG tablet Take 10 mg by  mouth at bedtime.     No current facility-administered medications for this visit.    Facility-Administered Medications Ordered in Other Visits  Medication Dose Route Frequency Provider Last Rate Last Dose  . sodium chloride flush (NS) 0.9 % injection 10 mL  10 mL Intravenous PRN Twana First, MD   10 mL at 03/27/17 1025    PHYSICAL EXAMINATION: ECOG PERFORMANCE STATUS: 1 - Symptomatic but completely ambulatory  Vitals:   03/27/17 0955  BP: 136/67  Pulse: 60  Resp: 18  Temp: 97.6 F (36.4 C)   Filed Weights   03/27/17 0955  Weight: 249 lb 12.8 oz (113.3 kg)     Physical Exam  Constitutional: She is oriented to person, place, and time and well-developed, well-nourished, and in no distress. No distress.  HENT:  Head: Normocephalic and atraumatic.  Mouth/Throat: No oropharyngeal exudate.  Eyes: Conjunctivae and EOM are normal. Pupils are equal, round, and reactive to light. No scleral icterus.  Neck: Normal range of motion. Neck supple. No JVD present.  Cardiovascular: Normal rate, regular rhythm and normal heart sounds.  Exam reveals no gallop and no friction rub.   No murmur heard. Pulmonary/Chest: Effort normal and breath sounds normal. No respiratory distress. She has no wheezes. She has no rales.  Abdominal: Soft. Bowel sounds are normal. She exhibits no distension. There is no tenderness. There is no guarding.  Musculoskeletal: Normal range of motion. She exhibits no edema or tenderness.  Lymphadenopathy:    She has no cervical adenopathy.  Neurological: She is alert and oriented to person, place, and time. No cranial nerve deficit. Gait normal.  Skin: Skin is warm and dry. No rash noted. No erythema. No pallor.  Psychiatric: Affect and judgment normal.  Nursing note and vitals reviewed.   LABORATORY DATA:   I have reviewed the data as listed  . CBC Latest Ref Rng & Units 12/05/2016 11/14/2016 10/24/2016  WBC 4.0 - 10.5 K/uL 16.5(H) 18.1(H) 14.0(H)  Hemoglobin 12.0  - 15.0 g/dL 12.1 12.1 13.1  Hematocrit 36.0 - 46.0 % 36.6 36.4 39.3  Platelets 150 - 400 K/uL 275 234 398    . CMP Latest Ref Rng & Units 12/05/2016 11/14/2016 10/24/2016  Glucose 65 - 99 mg/dL 149(H) 138(H) 132(H)  BUN 6 - 20 mg/dL '12 14 14  ' Creatinine 0.44 - 1.00 mg/dL 0.78 0.69 0.74  Sodium 135 - 145 mmol/L 138 137 135  Potassium 3.5 - 5.1 mmol/L 3.8 4.1 4.2  Chloride 101 - 111 mmol/L 102 105 102  CO2 22 - 32 mmol/L '27 25 27  ' Calcium 8.9 - 10.3 mg/dL 10.2 9.6 9.9  Total Protein 6.5 - 8.1 g/dL 6.7 6.4(L) 7.3  Total Bilirubin 0.3 - 1.2 mg/dL 0.5 0.4 0.4  Alkaline Phos 38 - 126 U/L 69 65 103  AST 15 - 41 U/L '20 19 23  ' ALT 14 - 54 U/L '18 20 24     ' RADIOGRAPHIC STUDIES: I have personally reviewed the radiological images as listed and agreed with the findings in the report.  DUAL X-RAY ABSORPTIOMETRY (DXA) FOR BONE MINERAL DENSITY 02/14/2017  IMPRESSION: Ordering Physician:  Dr. Brunetta Genera,  Your patient Tracy Valentine completed a BMD test on 02/14/2017 using the [employer] (software version: 14.10) manufactured by [employer]. The following summarizes the results of our evaluation. PATIENT BIOGRAPHICAL: Name: Tracy Valentine, Tracy Valentine Patient ID: [id number] Birth Date: January 15, 1956 Height: 66.0 in. Gender: Female Exam Date: 02/14/2017 Weight: 249.0 lbs. Indications: Breast Ca, Caucasian, Height Loss, History of Fracture (Adult), Partial Hysterectomy, Post Menopausal, Vitamin D Deficiency Fractures: Ankle Treatments: Asprin, Synthroid, Vitamin D DENSITOMETRY RESULTS: Site         Region     Measured Date Measured Age WHO Classification Young Adult T-score BMD         %Change vs. Previous Significant Change (*) DualFemur Neck Left 4/14/[age] 58.6 Normal -0.2 1.005 g/cm2 - -  Left Forearm Radius 33% 02[age]14/2017 58.6 Normal 1.1 0.795 g/cm2 - - ASSESSMENT: BMD as determined from Femur Neck Left is 1.005 g/cm2 with a T-Score of -0.2. This patient is  considered normal according to [facility]) criteria. (Lumbar spine was not utilized due to advanced degenerative changes.) World Pharmacologist (WHO) criteria for post-menopausal, Caucasian Women: Normal:       T-score at or above -1 SD Osteopenia:   T-score between -1 and -2.5 SD Osteoporosis: T-score at or below -2.5 SD     ASSESSMENT & PLAN:   61 year old female with   1) Early Stage pT1C,pN0, Mx (Stage IA) with high-grade ER positive PR negative HER-2/neu negative breast cancer status post left breast lumpectomy, adjuvant chemotherapy with TCx4 cycles. High-grade DCIS Oncotype recurrence score of 30 Baseline DEXA: normal  BCI-high risk  PLAN: - Continue endocrine therapy with anastrozole.  BCI high so patient will benefit from 10 years of endocrine therapy. - Continue TUMS and vitamin D. - Left diagnostic mammogram ordered today for October 2018. - RTC in 3 months for follow up with labs.  Orders Placed This Encounter  Procedures  . MM Digital Diagnostic Unilat L    Standing Status:   Future    Standing Expiration Date:   03/27/2018    Scheduling Instructions:     Breast center in [place] in october    Order Specific Question:   Reason for Exam (SYMPTOM  OR DIAGNOSIS REQUIRED)    Answer:   h/o left breast cancer    Order Specific Question:   Is the patient pregnant?    Answer:   No    Order Specific Question:   Preferred imaging location?    Answer:   [facility]  . CBC with Differential    Standing Status:   Future    Standing Expiration Date:   03/27/2018  . Comprehensive metabolic panel    Standing Status:   Future    Standing Expiration Date:  03/27/2018    This document serves as a record of services personally performed by Twana First, MD. It was created on her behalf by Shirlean Mylar, a trained medical scribe. The creation of this record is based on the scribe's personal observations and the provider's statements to them. This  document has been checked and approved by the attending provider.  I have reviewed the above documentation for accuracy and completeness and I agree with the above.

## 2017-03-27 NOTE — Progress Notes (Signed)
[facility] Clinical Social Work  Clinical Social Work was referred by patient for assistance/follow up on Child psychotherapist. Clinical Social Worker spoke with patient after her appointment at [facility] to offer support and assess for needs. CSW updated pt that [employer] in [place] were currently reviewing her assistance application. CSW reminded pt that she applied at the end of her treatment and for this agency they prefer you apply for assistance at the beginning of treatment. CSW again inquired if pt had contacted [facility] to date to apply and pt states she forgot about that organization, but that she planned to contact [facility] for other options.  CSW explained to her that she could, however, CSW had shared with pt all resources she qualifies for currently. CSW encouraged pt , again, to apply to [place] as that resource is also time limited from time of treatment or surgery ending. Pt continues to struggle with financial toxicity due to many medical bills. CSW will update pt if any news comes from [place] [facility] in [place].     Clinical Social Work interventions: Resource education  Loren Racer, LCSW, OSW-C [place] Tuesdays   [phone]

## 2017-03-27 NOTE — Progress Notes (Signed)
Tracy Valentine tolerated port flush well without complaints or incident. Port accessed with 20 gauge needle without blood return noted but flushed easily with 10 ml NS and 5 ml Heparin per protocol. Pt denied any discomfort at port site when flushed. Pt discharged self ambulatory in satisfactory condition

## 2017-03-27 NOTE — Patient Instructions (Signed)
[facility] at [facility] Discharge Instructions  RECOMMENDATIONS MADE BY THE CONSULTANT AND ANY TEST RESULTS WILL BE SENT TO YOUR REFERRING PHYSICIAN.  You saw Dr. Zhou today.  Thank you for choosing [facility] at [facility] to provide your oncology and hematology care.  To afford each patient quality time with our provider, please arrive at least 15 minutes before your scheduled appointment time.    If you have a lab appointment with the Cancer Center please come in thru the  Main Entrance and check in at the main information desk  You need to re-schedule your appointment should you arrive 10 or more minutes late.  We strive to give you quality time with our providers, and arriving late affects you and other patients whose appointments are after yours.  Also, if you no show three or more times for appointments you may be dismissed from the clinic at the providers discretion.     Again, thank you for choosing [facility].  Our hope is that these requests will decrease the amount of time that you wait before being seen by our physicians.       _____________________________________________________________  Should you have questions after your visit to  Cancer Center, please contact our office at [phone] between the hours of 8:30 a.m. and 4:30 p.m.  Voicemails left after 4:30 p.m. will not be returned until the following business day.  For prescription refill requests, have your pharmacy contact our office.       Resources For Cancer Patients and their Caregivers ? American Cancer Society: Can assist with transportation, wigs, general needs, runs Look Good Feel Better.        1-888-227-6333 ? Cancer Care: Provides financial assistance, online support groups, medication/co-pay assistance.  1-800-813-HOPE (4673) ? [facility] Assists [place] cancer patients and their families through  emotional , educational and financial support.  336-427-4357 ? [place] DSS Where to apply for food stamps, Medicaid and utility assistance. 336-342-1394 ? [facility]: Transportation to medical appointments. 336-347-2287 ? Social Security Administration: May apply for disability if have a Stage IV cancer. 336-342-7796 1-800-772-1213 ? [place] Aging, Disability and Transit Services: Assists with nutrition, care and transit needs. 336-349-2343  Cancer Center Support Programs: @10RELATIVEDAYS@ > Cancer Support Group  2nd Tuesday of the month 1pm-2pm, Journey Room  > Creative Journey  3rd Tuesday of the month 1130am-1pm, Journey Room  > Look Good Feel Better  1st Wednesday of the month 10am-12 noon, Journey Room (Call American Cancer Society to register 1-800-395-5775)    

## 2017-03-27 NOTE — Patient Instructions (Signed)
[facility] at [facility] Discharge Instructions  RECOMMENDATIONS MADE BY THE CONSULTANT AND ANY TEST RESULTS WILL BE SENT TO YOUR REFERRING PHYSICIAN.  Portacath flushed per protocol today. Follow-up as scheduled. Call clinic for any questions or concerns  Thank you for choosing [facility] at [facility] to provide your oncology and hematology care.  To afford each patient quality time with our provider, please arrive at least 15 minutes before your scheduled appointment time.    If you have a lab appointment with the Cancer Center please come in thru the  Main Entrance and check in at the main information desk  You need to re-schedule your appointment should you arrive 10 or more minutes late.  We strive to give you quality time with our providers, and arriving late affects you and other patients whose appointments are after yours.  Also, if you no show three or more times for appointments you may be dismissed from the clinic at the providers discretion.     Again, thank you for choosing [facility].  Our hope is that these requests will decrease the amount of time that you wait before being seen by our physicians.       _____________________________________________________________  Should you have questions after your visit to [facility], please contact our office at [phone] between the hours of 8:30 a.m. and 4:30 p.m.  Voicemails left after 4:30 p.m. will not be returned until the following business day.  For prescription refill requests, have your pharmacy contact our office.       Resources For Cancer Patients and their Caregivers ? American Cancer Society: Can assist with transportation, wigs, general needs, runs Look Good Feel Better.        1-888-227-6333 ? Cancer Care: Provides financial assistance, online support groups, medication/co-pay assistance.  1-800-813-HOPE (4673) ? [facility] Assists [place] cancer patients and their families through emotional , educational and financial support.  336-427-4357 ? [place] DSS Where to apply for food stamps, Medicaid and utility assistance. 336-342-1394 ? RCATS: Transportation to medical appointments. 336-347-2287 ? Social Security Administration: May apply for disability if have a Stage IV cancer. 336-342-7796 1-800-772-1213 ? [place] Aging, Disability and Transit Services: Assists with nutrition, care and transit needs. 336-349-2343  Cancer Center Support Programs: @10RELATIVEDAYS@ > Cancer Support Group  2nd Tuesday of the month 1pm-2pm, Journey Room  > Creative Journey  3rd Tuesday of the month 1130am-1pm, Journey Room  > Look Good Feel Better  1st Wednesday of the month 10am-12 noon, Journey Room (Call American Cancer Society to register 1-800-395-5775)   

## 2017-04-10 DIAGNOSIS — F419 Anxiety disorder, unspecified: Secondary | ICD-10-CM | POA: Diagnosis not present

## 2017-04-10 DIAGNOSIS — Z6841 Body Mass Index (BMI) 40.0 and over, adult: Secondary | ICD-10-CM | POA: Diagnosis not present

## 2017-04-10 DIAGNOSIS — G4730 Sleep apnea, unspecified: Secondary | ICD-10-CM | POA: Diagnosis not present

## 2017-04-10 DIAGNOSIS — G894 Chronic pain syndrome: Secondary | ICD-10-CM | POA: Diagnosis not present

## 2017-04-10 DIAGNOSIS — Z1389 Encounter for screening for other disorder: Secondary | ICD-10-CM | POA: Diagnosis not present

## 2017-04-17 ENCOUNTER — Encounter: Payer: Self-pay | Admitting: *Deleted

## 2017-04-17 NOTE — Progress Notes (Signed)
  [place] Clinical Social Work  Clinical Social Work was referred by Development worker, community as pt called with concerns related to unpaid medical bills. Pt reports she had not heard to date from [facility] in [place]. She reports she went to medicaid months ago, but still has yet to complete additional application for medicaid. CSW re-explained additional resources available and how to access. Pt will need to contact resources and complete application process as they direct. CSW has no way to get around guidelines or assist pt if she doesn't actually apply for resources. Pt stated understanding and will consider applications as previously directed.       Clinical Social Work interventions: Resource education and referral  Loren Racer, LCSW, OSW-C [place] Tuesdays   [phone]

## 2017-04-18 ENCOUNTER — Encounter: Payer: Self-pay | Admitting: *Deleted

## 2017-04-18 DIAGNOSIS — I10 Essential (primary) hypertension: Secondary | ICD-10-CM | POA: Diagnosis not present

## 2017-04-18 DIAGNOSIS — N390 Urinary tract infection, site not specified: Secondary | ICD-10-CM | POA: Diagnosis not present

## 2017-04-18 DIAGNOSIS — Z6841 Body Mass Index (BMI) 40.0 and over, adult: Secondary | ICD-10-CM | POA: Diagnosis not present

## 2017-04-18 DIAGNOSIS — R7301 Impaired fasting glucose: Secondary | ICD-10-CM | POA: Diagnosis not present

## 2017-04-18 DIAGNOSIS — E782 Mixed hyperlipidemia: Secondary | ICD-10-CM | POA: Diagnosis not present

## 2017-04-18 DIAGNOSIS — R39198 Other difficulties with micturition: Secondary | ICD-10-CM | POA: Diagnosis not present

## 2017-04-18 NOTE — Progress Notes (Signed)
[place] Clinical Social Work  Clinical Social Work received return contact from [employer] in [place] and pt was approved for assistance and $2500 grant to assist with her cancer treatment. [place] in [place] was provided with updated needed information. Pt made aware of grant.   Clinical Social Work interventions: Resource assistance  [employer], [place] [facility] Tuesdays   [phone]

## 2017-04-26 ENCOUNTER — Other Ambulatory Visit (HOSPITAL_COMMUNITY): Payer: Self-pay

## 2017-04-26 DIAGNOSIS — Z170 Estrogen receptor positive status [ER+]: Principal | ICD-10-CM

## 2017-04-26 DIAGNOSIS — C50412 Malignant neoplasm of upper-outer quadrant of left female breast: Secondary | ICD-10-CM

## 2017-04-26 MED ORDER — ONDANSETRON HCL 8 MG PO TABS: 8.0000 mg | ORAL_TABLET | Freq: Three times a day (TID) | ORAL | 2 refills | Status: DC | PRN

## 2017-04-26 MED ORDER — PROCHLORPERAZINE MALEATE 10 MG PO TABS: 10.0000 mg | ORAL_TABLET | Freq: Four times a day (QID) | ORAL | 2 refills | Status: DC | PRN

## 2017-04-26 NOTE — Telephone Encounter (Signed)
Received refill request from patients pharmacy for Zofran and compazine. Reviewed with provider, chart checked and refilled.

## 2017-05-02 DIAGNOSIS — M5416 Radiculopathy, lumbar region: Secondary | ICD-10-CM | POA: Diagnosis not present

## 2017-05-02 DIAGNOSIS — M47812 Spondylosis without myelopathy or radiculopathy, cervical region: Secondary | ICD-10-CM | POA: Diagnosis not present

## 2017-05-02 DIAGNOSIS — M47816 Spondylosis without myelopathy or radiculopathy, lumbar region: Secondary | ICD-10-CM | POA: Diagnosis not present

## 2017-05-02 DIAGNOSIS — G894 Chronic pain syndrome: Secondary | ICD-10-CM | POA: Diagnosis not present

## 2017-05-07 ENCOUNTER — Other Ambulatory Visit (HOSPITAL_COMMUNITY): Payer: Self-pay | Admitting: Oncology

## 2017-05-07 ENCOUNTER — Encounter (HOSPITAL_COMMUNITY): Payer: Medicare Other | Attending: Hematology & Oncology

## 2017-05-07 ENCOUNTER — Encounter (HOSPITAL_COMMUNITY): Payer: Self-pay

## 2017-05-07 DIAGNOSIS — L089 Local infection of the skin and subcutaneous tissue, unspecified: Secondary | ICD-10-CM | POA: Insufficient documentation

## 2017-05-07 DIAGNOSIS — R110 Nausea: Secondary | ICD-10-CM | POA: Insufficient documentation

## 2017-05-07 DIAGNOSIS — E876 Hypokalemia: Secondary | ICD-10-CM | POA: Insufficient documentation

## 2017-05-07 DIAGNOSIS — B370 Candidal stomatitis: Secondary | ICD-10-CM | POA: Insufficient documentation

## 2017-05-07 DIAGNOSIS — Z170 Estrogen receptor positive status [ER+]: Principal | ICD-10-CM

## 2017-05-07 DIAGNOSIS — C50412 Malignant neoplasm of upper-outer quadrant of left female breast: Secondary | ICD-10-CM

## 2017-05-07 DIAGNOSIS — R300 Dysuria: Secondary | ICD-10-CM | POA: Insufficient documentation

## 2017-05-07 MED ORDER — HEPARIN SOD (PORK) LOCK FLUSH 100 UNIT/ML IV SOLN
500.0000 [IU] | Freq: Once | INTRAVENOUS | Status: DC
  Filled 2017-05-07: qty 5

## 2017-05-07 MED ORDER — SODIUM CHLORIDE 0.9% FLUSH: 10.0000 mL | INTRAVENOUS | Status: DC | PRN

## 2017-05-07 NOTE — Progress Notes (Signed)
Gentry Fitz presented for Portacath access and flush. Portacath located right chest wall accessed with  H 20 needle.  No blood return present and there is great resistance when attempting to flush. Deaccessed. Discussed with T. Kefalas, PA-C - dye study ordered for a later date per pt convenience.  Procedure tolerated well and without incident.  Discharged ambulatory.

## 2017-05-08 ENCOUNTER — Encounter (HOSPITAL_COMMUNITY): Payer: Medicare Other

## 2017-05-24 ENCOUNTER — Ambulatory Visit (HOSPITAL_COMMUNITY)
Admission: RE | Admit: 2017-05-24 | Discharge: 2017-05-24 | Disposition: A | Discharge status: Home / Self Care | Payer: Medicare Other | Source: Ambulatory Visit | Attending: Oncology | Admitting: Oncology

## 2017-05-24 ENCOUNTER — Encounter (HOSPITAL_COMMUNITY): Payer: Self-pay

## 2017-05-24 DIAGNOSIS — Z452 Encounter for adjustment and management of vascular access device: Secondary | ICD-10-CM | POA: Diagnosis not present

## 2017-05-24 DIAGNOSIS — Z95828 Presence of other vascular implants and grafts: Secondary | ICD-10-CM | POA: Diagnosis not present

## 2017-05-24 DIAGNOSIS — Z170 Estrogen receptor positive status [ER+]: Secondary | ICD-10-CM | POA: Insufficient documentation

## 2017-05-24 DIAGNOSIS — C50412 Malignant neoplasm of upper-outer quadrant of left female breast: Secondary | ICD-10-CM | POA: Diagnosis present

## 2017-05-24 MED ORDER — HEPARIN SOD (PORK) LOCK FLUSH 100 UNIT/ML IV SOLN
INTRAVENOUS | Status: AC
  Filled 2017-05-24: qty 5

## 2017-05-24 MED ORDER — IOPAMIDOL (ISOVUE-300) INJECTION 61%
INTRAVENOUS | Status: AC
  Administered 2017-05-24: 15 mL
  Filled 2017-05-24: qty 30

## 2017-05-24 MED ORDER — SODIUM CHLORIDE 0.9% FLUSH
INTRAVENOUS | Status: AC
  Filled 2017-05-24: qty 30

## 2017-06-19 ENCOUNTER — Other Ambulatory Visit (HOSPITAL_COMMUNITY): Payer: Medicare Other

## 2017-06-19 ENCOUNTER — Ambulatory Visit (HOSPITAL_COMMUNITY): Payer: Medicare Other

## 2017-06-27 DIAGNOSIS — M47816 Spondylosis without myelopathy or radiculopathy, lumbar region: Secondary | ICD-10-CM | POA: Diagnosis not present

## 2017-06-27 DIAGNOSIS — Z79891 Long term (current) use of opiate analgesic: Secondary | ICD-10-CM | POA: Diagnosis not present

## 2017-06-27 DIAGNOSIS — M5412 Radiculopathy, cervical region: Secondary | ICD-10-CM | POA: Diagnosis not present

## 2017-06-27 DIAGNOSIS — F329 Major depressive disorder, single episode, unspecified: Secondary | ICD-10-CM | POA: Diagnosis not present

## 2017-06-27 DIAGNOSIS — M5416 Radiculopathy, lumbar region: Secondary | ICD-10-CM | POA: Diagnosis not present

## 2017-06-27 DIAGNOSIS — M1711 Unilateral primary osteoarthritis, right knee: Secondary | ICD-10-CM | POA: Diagnosis not present

## 2017-06-27 DIAGNOSIS — M47812 Spondylosis without myelopathy or radiculopathy, cervical region: Secondary | ICD-10-CM | POA: Diagnosis not present

## 2017-06-27 DIAGNOSIS — M19011 Primary osteoarthritis, right shoulder: Secondary | ICD-10-CM | POA: Diagnosis not present

## 2017-06-27 DIAGNOSIS — G894 Chronic pain syndrome: Secondary | ICD-10-CM | POA: Diagnosis not present

## 2017-06-27 DIAGNOSIS — M62830 Muscle spasm of back: Secondary | ICD-10-CM | POA: Diagnosis not present

## 2017-07-10 ENCOUNTER — Encounter (HOSPITAL_COMMUNITY): Payer: Self-pay

## 2017-07-10 ENCOUNTER — Encounter (HOSPITAL_BASED_OUTPATIENT_CLINIC_OR_DEPARTMENT_OTHER): Payer: Medicare Other | Admitting: Oncology

## 2017-07-10 ENCOUNTER — Encounter (HOSPITAL_COMMUNITY): Payer: Medicare Other | Attending: Hematology & Oncology

## 2017-07-10 VITALS — BP 142/69 | HR 65 | Resp 16 | Ht 67.0 in | Wt 238.0 lb

## 2017-07-10 DIAGNOSIS — C50412 Malignant neoplasm of upper-outer quadrant of left female breast: Secondary | ICD-10-CM

## 2017-07-10 DIAGNOSIS — B370 Candidal stomatitis: Secondary | ICD-10-CM | POA: Insufficient documentation

## 2017-07-10 DIAGNOSIS — L089 Local infection of the skin and subcutaneous tissue, unspecified: Secondary | ICD-10-CM | POA: Insufficient documentation

## 2017-07-10 DIAGNOSIS — R300 Dysuria: Secondary | ICD-10-CM | POA: Insufficient documentation

## 2017-07-10 DIAGNOSIS — E876 Hypokalemia: Secondary | ICD-10-CM | POA: Insufficient documentation

## 2017-07-10 DIAGNOSIS — Z95828 Presence of other vascular implants and grafts: Secondary | ICD-10-CM

## 2017-07-10 DIAGNOSIS — Z79811 Long term (current) use of aromatase inhibitors: Secondary | ICD-10-CM

## 2017-07-10 DIAGNOSIS — Z170 Estrogen receptor positive status [ER+]: Secondary | ICD-10-CM

## 2017-07-10 DIAGNOSIS — R110 Nausea: Secondary | ICD-10-CM | POA: Diagnosis present

## 2017-07-10 LAB — COMPREHENSIVE METABOLIC PANEL
ALT: 18 U/L (ref 14–54)
AST: 18 U/L (ref 15–41)
Albumin: 4.1 g/dL (ref 3.5–5.0)
Alkaline Phosphatase: 71 U/L (ref 38–126)
Anion gap: 9 (ref 5–15)
BILIRUBIN TOTAL: 0.5 mg/dL (ref 0.3–1.2)
BUN: 14 mg/dL (ref 6–20)
CO2: 27 mmol/L (ref 22–32)
Calcium: 10 mg/dL (ref 8.9–10.3)
Chloride: 102 mmol/L (ref 101–111)
Creatinine, Ser: 0.78 mg/dL (ref 0.44–1.00)
Glucose, Bld: 99 mg/dL (ref 65–99)
Potassium: 4 mmol/L (ref 3.5–5.1)
Sodium: 138 mmol/L (ref 135–145)
TOTAL PROTEIN: 7.3 g/dL (ref 6.5–8.1)

## 2017-07-10 LAB — CBC WITH DIFFERENTIAL/PLATELET
BASOS ABS: 0 10*3/uL (ref 0.0–0.1)
Basophils Relative: 1 %
EOS ABS: 0.1 10*3/uL (ref 0.0–0.7)
EOS PCT: 2 %
HCT: 41.1 % (ref 36.0–46.0)
Hemoglobin: 13.5 g/dL (ref 12.0–15.0)
Lymphocytes Relative: 22 %
Lymphs Abs: 1.1 10*3/uL (ref 0.7–4.0)
MCH: 32.1 pg (ref 26.0–34.0)
MCHC: 32.8 g/dL (ref 30.0–36.0)
MCV: 97.6 fL (ref 78.0–100.0)
Monocytes Absolute: 0.3 10*3/uL (ref 0.1–1.0)
Monocytes Relative: 6 %
NEUTROS PCT: 69 %
Neutro Abs: 3.4 10*3/uL (ref 1.7–7.7)
PLATELETS: 192 10*3/uL (ref 150–400)
RBC: 4.21 MIL/uL (ref 3.87–5.11)
RDW: 13.1 % (ref 11.5–15.5)
WBC: 4.9 10*3/uL (ref 4.0–10.5)

## 2017-07-10 MED ORDER — SODIUM CHLORIDE 0.9% FLUSH
20.0000 mL | INTRAVENOUS | Status: DC | PRN
  Administered 2017-07-10: 20 mL via INTRAVENOUS
  Filled 2017-07-10: qty 20

## 2017-07-10 MED ORDER — HEPARIN SOD (PORK) LOCK FLUSH 100 UNIT/ML IV SOLN
500.0000 [IU] | Freq: Once | INTRAVENOUS | Status: AC
  Administered 2017-07-10: 500 [IU] via INTRAVENOUS

## 2017-07-10 NOTE — Progress Notes (Signed)
[name]  HEMATOLOGY ONCOLOGY PROGRESS NOTE  Date of service: .01/10/2017  Patient Care Team: [employer], MD as PCP - General (Internal Medicine)  CC: f/u for left breast cancer ER positive PR negative HER-2/neu negative   SUMMARY OF ONCOLOGIC HISTORY:   Breast cancer of upper-outer quadrant of left female breast ([name])   07/26/2016 Mammogram    Area of developing asymmetry with distortion located within the upper-outer quadrant left breast. Tissue sampling via tomosynthesis guided biopsy is recommended and will be scheduled.  RECOMMENDATION: Left breast tomosynthesis guided biopsy. This has been scheduled for 07/28/2016.       07/28/2016 Initial Biopsy    Coil shaped clip corresponds to the asymmetry in the outer left breast. Clip is well positioned at the biopsy site. 2. X shaped clip corresponds to the ASYMMETRY/DISTORTION in the upper-outer quadrant of the left breast. Clip is positioned approximately 1 cm medial to the center of the biopsy cavity      07/28/2016 Pathology Results    Estrogen Receptor: 80%, POSITIVE, MODERATE STAINING INTENSITY Progesterone Receptor: 0%, NEGATIVE Proliferation Marker Ki67: 70% HER 2 negative by FISH Breast, left, needle core biopsy, upper outer quadrant - INVASIVE DUCTAL CARCINOMA, SEE COMMENT. - HIGH GRADE DUCTAL CARCINOMA IN SITU WITH NECROSIS.      08/24/2016 Oncotype testing    Recurrence Score Result 30, 10 year risk of distant recurrence Tamoxifen alone 20%      10/03/2016 - 12/05/2016 Chemotherapy    The patient had palonosetron (ALOXI) injection 0.25 mg, 0.25 mg, Intravenous,  Once, 2 of 4 cycles Administration: 0.25 mg (10/24/2016)  pegfilgrastim (NEULASTA ONPRO KIT) injection 6 mg, 6 mg, Subcutaneous, Once, 2 of 4 cycles Administration: 6 mg (10/24/2016)  cyclophosphamide (CYTOXAN) 1,380 mg in sodium chloride 0.9 % 250 mL chemo infusion, 600 mg/m2 = 1,380 mg, Intravenous,  Once, 2 of 4 cycles Administration: 1,380 mg  (10/24/2016)  DOCEtaxel (TAXOTERE) 170 mg in dextrose 5 % 250 mL chemo infusion, 75 mg/m2 = 170 mg, Intravenous,  Once, 2 of 4 cycles Dose modification: 65 mg/m2 (original dose 75 mg/m2, Cycle 2, Reason: Dose not tolerated)  fosaprepitant (EMEND) 150 mg, dexamethasone (DECADRON) 12 mg in sodium chloride 0.9 % 145 mL IVPB, , Intravenous,  Once, 1 of 3 cycles Administration:  (10/24/2016)  for chemotherapy treatment.         - 02/06/2017 Radiation Therapy    Completed adjuvant breast RT       02/26/2017 -  Anti-estrogen oral therapy    Started anastrozole      03/05/2017 Pathology Results    BCI-high risk of late recurrence (years 5-10) at 10.6% with a high likelihood of benefit from extended endocrine therapy.  High overall risk of recurrence (years 0-10) 19.5% and a high likelihood of benefit from extended endocrine therapy.       INTERVAL HISTORY:  Tracy Valentine 61 y.o. female is here for follow up of left breast cancer ER+/PR-/HER2-. Ki67 is 70%.  She has completed her 4 cycles of adjuvant TC for high-grade early stage breast cancer with a high Oncotype DX score and high grade DCIS.   [name] presents today for continuing follow up. She is tolerating arimidex well. She denies any hot flashes or arthralgias. She is taking 2 TUMS a day for calcium supplementation and takes vitamin D. She denies any changes in appetite, shortness of breath, chest pain, abdominal pain, focal weakness.    REVIEW OF SYSTEMS:   Review of Systems  Constitutional: Negative.  Negative for chills and fever.  HENT: Negative.  Negative for hearing loss, sore throat and tinnitus.   Eyes: Negative.  Negative for blurred vision, photophobia and discharge.  Respiratory: Negative.  Negative for cough, hemoptysis, shortness of breath and wheezing.   Cardiovascular: Negative.  Negative for chest pain, palpitations, orthopnea, claudication and leg swelling.  Gastrointestinal: Negative.  Negative for  abdominal pain, constipation, diarrhea, melena, nausea and vomiting.  Genitourinary: Negative.  Negative for dysuria and hematuria.  Musculoskeletal: Negative.  Negative for back pain, joint pain and myalgias.  Skin: Negative.  Negative for itching and rash.  Neurological: Negative.  Negative for dizziness, weakness and headaches.  Endo/Heme/Allergies: Negative.  Negative for environmental allergies and polydipsia. Does not bruise/bleed easily.  Psychiatric/Behavioral: Negative.  Negative for depression. The patient is not nervous/anxious and does not have insomnia.   All other systems reviewed and are negative. 14 Point review of systems of done and is negative except as noted above.  . Past Medical History:  Diagnosis Date  . Anxiety   . Breast cancer (HCC)    breast  . Chronic pain   . DDD (degenerative disc disease), cervical   . DDD (degenerative disc disease), lumbar   . Degenerative disc disease at L5-S1 level   . Depression   . GERD (gastroesophageal reflux disease)   . History of kidney stones   . Hyperlipidemia   . Hypothyroidism   . Obesity   . Osteoarthritis   . PONV (postoperative nausea and vomiting)   . Sleep apnea    uses CPAP  . Spinal stenosis   . Spinal stenosis     . Past Surgical History:  Procedure Laterality Date  . ABDOMINAL HYSTERECTOMY     partial hyst, still have ovaries  . BREAST LUMPECTOMY WITH RADIOACTIVE SEED AND SENTINEL LYMPH NODE BIOPSY Left 08/24/2016   Procedure: LEFT BREAST PARTIAL MASTECTOMY WITH RADIOACTIVE SEED AND LEFT SENTINEL LYMPH NODE MAPPING;  Surgeon: Erroll Luna, MD;  Location: [facility];  Service: General;  Laterality: Left;  . CHOLECYSTECTOMY    . FOOT FOREIGN BODY REMOVAL Left   . PORTACATH PLACEMENT Right 09/29/2016   Procedure: INSERTION PORT-A-CATH RIGHT SUBCLAVIAN;  Surgeon: Aviva Signs, MD;  Location: [facility];  Service: General;  Laterality: Right;    . Social History  Substance Use Topics  .  Smoking status: Never Smoker  . Smokeless tobacco: Never Used  . Alcohol use No    ALLERGIES:  is allergic to adhesive [tape]; amoxicillin; darvocet [propoxyphene n-acetaminophen]; doxycycline; erythromycin; shellfish allergy; zithromax [azithromycin]; and latex.  MEDICATIONS:  Current Outpatient Prescriptions  Medication Sig Dispense Refill  . ALPRAZolam (XANAX) 1 MG tablet Take 1 mg by mouth 4 (four) times daily as needed for sleep.    [name] Kitchen anastrozole (ARIMIDEX) 1 MG tablet Take 1 tablet (1 mg total) by mouth daily. 90 tablet 2  . aspirin 325 MG tablet Take 325 mg by mouth daily.    . calcium carbonate (TUMS - DOSED IN MG ELEMENTAL CALCIUM) 500 MG chewable tablet Chew 2 tablets by mouth 2 (two) times daily.    [name] Kitchen docusate sodium (COLACE) 50 MG capsule Take 100 mg by mouth 2 (two) times daily.    . fluticasone (FLONASE) 50 MCG/ACT nasal spray Place 1 spray into both nostrils daily.    [name] Kitchen ipratropium (ATROVENT) 0.03 % nasal spray 1 spray daily.  4  . KRILL OIL PO Take 1 capsule by mouth daily.    [name] Kitchen levothyroxine (SYNTHROID, LEVOTHROID) 100  MCG tablet Take 100 mcg by mouth daily.  5  . lidocaine-prilocaine (EMLA) cream Apply a quarter size amount to port site 1 hour prior to chemo. Do not rub in. Cover with plastic wrap. 30 g 3  . montelukast (SINGULAIR) 10 MG tablet Take 10 mg by mouth daily.  11  . ondansetron (ZOFRAN) 8 MG tablet Take 1 tablet (8 mg total) by mouth every 8 (eight) hours as needed for nausea or vomiting. 30 tablet 2  . Oxycodone HCl 10 MG TABS   0  . prochlorperazine (COMPAZINE) 10 MG tablet Take 1 tablet (10 mg total) by mouth every 6 (six) hours as needed for nausea or vomiting. 30 tablet 2  . senna (SENOKOT) 8.6 MG TABS tablet Take 1 tablet by mouth at bedtime as needed.    . simvastatin (ZOCOR) 20 MG tablet Take 20 mg by mouth every evening.    . venlafaxine (EFFEXOR) 75 MG tablet Take 75 mg by mouth 2 (two) times daily.    [name] Kitchen zolpidem (AMBIEN) 10 MG tablet Take 10 mg by  mouth at bedtime.     No current facility-administered medications for this visit.     PHYSICAL EXAMINATION: ECOG PERFORMANCE STATUS: 1 - Symptomatic but completely ambulatory  Vitals:   07/10/17 1041  BP: (!) 142/69  Pulse: 65  Resp: 16  SpO2: 100%   Filed Weights   07/10/17 1041  Weight: 238 lb (108 kg)     Physical Exam  Constitutional: She is oriented to person, place, and time and well-developed, well-nourished, and in no distress. No distress.  HENT:  Head: Normocephalic and atraumatic.  Mouth/Throat: No oropharyngeal exudate.  Eyes: Pupils are equal, round, and reactive to light. Conjunctivae and EOM are normal. No scleral icterus.  Neck: Normal range of motion. Neck supple. No JVD present.  Cardiovascular: Normal rate, regular rhythm and normal heart sounds.  Exam reveals no gallop and no friction rub.   No murmur heard. Pulmonary/Chest: Effort normal and breath sounds normal. No respiratory distress. She has no wheezes. She has no rales.  Abdominal: Soft. Bowel sounds are normal. She exhibits no distension. There is no tenderness. There is no guarding.  Musculoskeletal: Normal range of motion. She exhibits no edema or tenderness.  Lymphadenopathy:    She has no cervical adenopathy.  Neurological: She is alert and oriented to person, place, and time. No cranial nerve deficit. Gait normal.  Skin: Skin is warm and dry. No rash noted. No erythema. No pallor.  Psychiatric: Affect and judgment normal.  Nursing note and vitals reviewed. BREAST EXAM: left lumpectomy scar well healed, no masses or skin changes noted. Right breast without masses or skin changes. No axillary lymphadenopathy bilaterally.  LABORATORY DATA:   I have reviewed the data as listed  . CBC Latest Ref Rng & Units 12/05/2016 11/14/2016 10/24/2016  WBC 4.0 - 10.5 K/uL 16.5(H) 18.1(H) 14.0(H)  Hemoglobin 12.0 - 15.0 g/dL 12.1 12.1 13.1  Hematocrit 36.0 - 46.0 % 36.6 36.4 39.3  Platelets 150 - 400 K/uL  275 234 398    . CMP Latest Ref Rng & Units 12/05/2016 11/14/2016 10/24/2016  Glucose 65 - 99 mg/dL 149(H) 138(H) 132(H)  BUN 6 - 20 mg/dL _0 Creatinine 0.44 - 1.00 mg/dL 0.78 0.69 0.74  Sodium 135 - 145 mmol/L 138 137 135  Potassium 3.5 - 5.1 mmol/L 3.8 4.1 4.2  Chloride 101 - 111 mmol/L 102 105 102  CO2 22 - 32 mmol/L 27 25 27  Calcium 8.9 - 10.3 mg/dL 10.2 9.6 9.9  Total Protein 6.5 - 8.1 g/dL 6.7 6.4(L) 7.3  Total Bilirubin 0.3 - 1.2 mg/dL 0.5 0.4 0.4  Alkaline Phos 38 - 126 U/L 69 65 103  AST 15 - 41 U/L _0 ALT 14 - 54 U/L _1 RADIOGRAPHIC STUDIES: I have personally reviewed the radiological images as listed and agreed with the findings in the report.  DUAL X-RAY ABSORPTIOMETRY (DXA) FOR BONE MINERAL DENSITY 02/14/2017  IMPRESSION: Ordering Physician:  Dr. Brunetta Genera,  Your patient Tracy Valentine completed a BMD test on 02/14/2017 using the [place] (software version: 14.10) manufactured by [employer]. The following summarizes the results of our evaluation. PATIENT BIOGRAPHICAL: Name: Tracy Valentine, Tracy Valentine Patient ID: [id number] Birth Date: Sep 14, 1956 Height: 66.0 in. Gender: Female Exam Date: 02/14/2017 Weight: 249.0 lbs. Indications: Breast Ca, Caucasian, Height Loss, History of Fracture (Adult), Partial Hysterectomy, Post Menopausal, Vitamin D Deficiency Fractures: Ankle Treatments: Asprin, Synthroid, Vitamin D DENSITOMETRY RESULTS: Site         Region     Measured Date Measured Age WHO Classification Young Adult T-score BMD         %Change vs. Previous Significant Change (*) DualFemur Neck Left 4/14/2017 58.6 Normal -0.2 1.005 g/cm2 - -  Left Forearm Radius 33% 4/14/2017 58.6 Normal 1.1 0.795 g/cm2 - - ASSESSMENT: BMD as determined from Femur Neck Left is 1.005 g/cm2 with a T-Score of -0.2. This patient is considered normal according to [facility]) criteria. (Lumbar spine was not  utilized due to advanced degenerative changes.) World Pharmacologist (WHO) criteria for post-menopausal, Caucasian Women: Normal:       T-score at or above -1 SD Osteopenia:   T-score between -1 and -2.5 SD Osteoporosis: T-score at or below -2.5 SD     ASSESSMENT & PLAN:   61 y.o. female with   1) Early Stage pT1C,pN0, Mx (Stage IA) with high-grade ER positive PR negative HER-2/neu negative breast cancer status post left breast lumpectomy, adjuvant chemotherapy with TCx4 cycles. High-grade DCIS Oncotype recurrence score of 30 Baseline DEXA: normal  BCI-high risk  PLAN: - Clinically NED on breast exam today. - Continue endocrine therapy with anastrozole.  BCI high so patient will benefit from 10 years of endocrine therapy. - Continue TUMS and vitamin D. - Left diagnostic mammogram scheduled for July 23, 2017. - RTC in 3 months for follow up with labs. - Port flush q8 weeks   Twana First, MD

## 2017-07-10 NOTE — Progress Notes (Signed)
Tracy Valentine tolerated port lab draw with flush well without complaints or incident. Port accessed with 20 gauge needle with blood drawn for labs ordered then flushed with 20 ml NS and 5 ml Heparin easily per protocol then de-accessed. Pt discharged self ambulatory in satisfactory condition.

## 2017-07-10 NOTE — Patient Instructions (Signed)
[place] at [facility] Discharge Instructions  RECOMMENDATIONS MADE BY THE CONSULTANT AND ANY TEST RESULTS WILL BE SENT TO YOUR REFERRING PHYSICIAN.  Labs drawn from portacath today then flushed per protocol. Follow-up as scheduled. Call clinic for any questions or concerns.  Thank you for choosing [place] at [facility] to provide your oncology and hematology care.  To afford each patient quality time with our provider, please arrive at least 15 minutes before your scheduled appointment time.    If you have a lab appointment with the [facility] please come in thru the  Main Entrance and check in at the main information desk  You need to re-schedule your appointment should you arrive 10 or more minutes late.  We strive to give you quality time with our providers, and arriving late affects you and other patients whose appointments are after yours.  Also, if you no show three or more times for appointments you may be dismissed from the clinic at the providers discretion.     Again, thank you for choosing [facility].  Our hope is that these requests will decrease the amount of time that you wait before being seen by our physicians.       _____________________________________________________________  Should you have questions after your visit to [facility], please contact our office at [phone] between the hours of 8:30 a.m. and 4:30 p.m.  Voicemails left after 4:30 p.m. will not be returned until the following business day.  For prescription refill requests, have your pharmacy contact our office.       Resources For Cancer Patients and their Caregivers ? American Cancer Society: Can assist with transportation, wigs, general needs, runs Look Good Feel Better.        [phone] ? Cancer Care: Provides financial assistance, online support groups, medication/co-pay assistance.  1-800-813-HOPE  873-497-5833) ? [facility] Assists [place] cancer patients and their families through emotional , educational and financial support.  (618) 148-8278 ? [place] DSS Where to apply for food stamps, Medicaid and utility assistance. (307)799-1618 ? RCATS: Transportation to medical appointments. 864-449-4374 ? Social Security Administration: May apply for disability if have a Stage IV cancer. 414-658-4465 9255592043 ? [employer], Disability and Transit Services: Assists with nutrition, care and transit needs. [place] Support Programs: @10RELATIVEDAYS @ > Cancer Support Group  2nd Tuesday of the month 1pm-2pm, Journey Room  > Creative Journey  3rd Tuesday of the month 1130am-1pm, Journey Room  > Look Good Feel Better  1st Wednesday of the month 10am-12 noon, Journey Room (Call [place] to register [phone])

## 2017-07-20 ENCOUNTER — Other Ambulatory Visit (HOSPITAL_COMMUNITY): Payer: Self-pay

## 2017-07-20 DIAGNOSIS — Z170 Estrogen receptor positive status [ER+]: Principal | ICD-10-CM

## 2017-07-20 DIAGNOSIS — C50412 Malignant neoplasm of upper-outer quadrant of left female breast: Secondary | ICD-10-CM

## 2017-07-20 MED ORDER — PROCHLORPERAZINE MALEATE 10 MG PO TABS: 10.0000 mg | ORAL_TABLET | Freq: Four times a day (QID) | ORAL | 2 refills | Status: DC | PRN

## 2017-07-20 NOTE — Telephone Encounter (Signed)
Received refill request from patients pharmacy for compazine. Reviewed with provider, chart checked and refilled.

## 2017-07-23 ENCOUNTER — Ambulatory Visit
Admission: RE | Admit: 2017-07-23 | Discharge: 2017-07-23 | Disposition: A | Discharge status: Home / Self Care | Payer: Medicare Other | Source: Ambulatory Visit | Attending: Oncology | Admitting: Oncology

## 2017-07-23 DIAGNOSIS — C50412 Malignant neoplasm of upper-outer quadrant of left female breast: Secondary | ICD-10-CM

## 2017-07-23 DIAGNOSIS — R928 Other abnormal and inconclusive findings on diagnostic imaging of breast: Secondary | ICD-10-CM | POA: Diagnosis not present

## 2017-07-23 HISTORY — DX: Personal history of antineoplastic chemotherapy: Z92.21

## 2017-08-01 DIAGNOSIS — Z1272 Encounter for screening for malignant neoplasm of vagina: Secondary | ICD-10-CM | POA: Diagnosis not present

## 2017-08-01 DIAGNOSIS — Z6838 Body mass index (BMI) 38.0-38.9, adult: Secondary | ICD-10-CM | POA: Diagnosis not present

## 2017-08-01 DIAGNOSIS — R898 Other abnormal findings in specimens from other organs, systems and tissues: Secondary | ICD-10-CM | POA: Diagnosis not present

## 2017-08-01 DIAGNOSIS — N390 Urinary tract infection, site not specified: Secondary | ICD-10-CM | POA: Diagnosis not present

## 2017-08-01 DIAGNOSIS — Z01419 Encounter for gynecological examination (general) (routine) without abnormal findings: Secondary | ICD-10-CM | POA: Diagnosis not present

## 2017-08-08 DIAGNOSIS — Z170 Estrogen receptor positive status [ER+]: Secondary | ICD-10-CM | POA: Diagnosis not present

## 2017-08-08 DIAGNOSIS — C50412 Malignant neoplasm of upper-outer quadrant of left female breast: Secondary | ICD-10-CM | POA: Diagnosis not present

## 2017-08-23 ENCOUNTER — Other Ambulatory Visit (HOSPITAL_COMMUNITY): Payer: Self-pay | Admitting: *Deleted

## 2017-08-23 DIAGNOSIS — C50412 Malignant neoplasm of upper-outer quadrant of left female breast: Secondary | ICD-10-CM

## 2017-08-23 DIAGNOSIS — Z170 Estrogen receptor positive status [ER+]: Principal | ICD-10-CM

## 2017-08-23 MED ORDER — ONDANSETRON HCL 8 MG PO TABS: 8.0000 mg | ORAL_TABLET | Freq: Three times a day (TID) | ORAL | 2 refills | Status: DC | PRN

## 2017-08-24 DIAGNOSIS — M5416 Radiculopathy, lumbar region: Secondary | ICD-10-CM | POA: Diagnosis not present

## 2017-08-24 DIAGNOSIS — M47812 Spondylosis without myelopathy or radiculopathy, cervical region: Secondary | ICD-10-CM | POA: Diagnosis not present

## 2017-08-24 DIAGNOSIS — M47816 Spondylosis without myelopathy or radiculopathy, lumbar region: Secondary | ICD-10-CM | POA: Diagnosis not present

## 2017-08-24 DIAGNOSIS — G894 Chronic pain syndrome: Secondary | ICD-10-CM | POA: Diagnosis not present

## 2017-08-28 ENCOUNTER — Encounter (HOSPITAL_COMMUNITY): Payer: Medicare Other

## 2017-09-11 ENCOUNTER — Encounter (HOSPITAL_COMMUNITY): Payer: Self-pay

## 2017-09-11 ENCOUNTER — Encounter (HOSPITAL_COMMUNITY): Payer: Medicare Other | Attending: Hematology & Oncology

## 2017-09-11 ENCOUNTER — Other Ambulatory Visit: Payer: Self-pay

## 2017-09-11 DIAGNOSIS — Z452 Encounter for adjustment and management of vascular access device: Secondary | ICD-10-CM

## 2017-09-11 DIAGNOSIS — E039 Hypothyroidism, unspecified: Secondary | ICD-10-CM | POA: Diagnosis not present

## 2017-09-11 DIAGNOSIS — B370 Candidal stomatitis: Secondary | ICD-10-CM | POA: Insufficient documentation

## 2017-09-11 DIAGNOSIS — R110 Nausea: Secondary | ICD-10-CM | POA: Insufficient documentation

## 2017-09-11 DIAGNOSIS — Z170 Estrogen receptor positive status [ER+]: Secondary | ICD-10-CM | POA: Insufficient documentation

## 2017-09-11 DIAGNOSIS — C50412 Malignant neoplasm of upper-outer quadrant of left female breast: Secondary | ICD-10-CM | POA: Diagnosis not present

## 2017-09-11 DIAGNOSIS — L089 Local infection of the skin and subcutaneous tissue, unspecified: Secondary | ICD-10-CM | POA: Insufficient documentation

## 2017-09-11 DIAGNOSIS — E876 Hypokalemia: Secondary | ICD-10-CM | POA: Insufficient documentation

## 2017-09-11 DIAGNOSIS — J329 Chronic sinusitis, unspecified: Secondary | ICD-10-CM | POA: Diagnosis not present

## 2017-09-11 DIAGNOSIS — F419 Anxiety disorder, unspecified: Secondary | ICD-10-CM | POA: Diagnosis not present

## 2017-09-11 DIAGNOSIS — Z6838 Body mass index (BMI) 38.0-38.9, adult: Secondary | ICD-10-CM | POA: Diagnosis not present

## 2017-09-11 DIAGNOSIS — R300 Dysuria: Secondary | ICD-10-CM | POA: Insufficient documentation

## 2017-09-11 MED ORDER — HEPARIN SOD (PORK) LOCK FLUSH 100 UNIT/ML IV SOLN
500.0000 [IU] | Freq: Once | INTRAVENOUS | Status: AC
  Administered 2017-09-11: 500 [IU] via INTRAVENOUS

## 2017-09-11 MED ORDER — HEPARIN SOD (PORK) LOCK FLUSH 100 UNIT/ML IV SOLN
INTRAVENOUS | Status: AC
  Filled 2017-09-11: qty 30

## 2017-09-11 MED ORDER — SODIUM CHLORIDE 0.9% FLUSH
10.0000 mL | INTRAVENOUS | Status: DC | PRN
  Administered 2017-09-11: 10 mL via INTRAVENOUS
  Filled 2017-09-11: qty 10

## 2017-09-11 NOTE — Progress Notes (Signed)
Tracy Valentine presented for Portacath access and flush.  Proper placement of portacath confirmed by CXR.  Portacath located right chest wall accessed with  H 20 needle.  No blood return and flushes w/o difficulty.  No s/s of infiltration at site. Portacath flushed with 38ml NS and 500U/19ml Heparin and needle removed intact.  Procedure tolerated well and without incident.  Discharged ambulatory.

## 2017-09-19 DIAGNOSIS — G4733 Obstructive sleep apnea (adult) (pediatric): Secondary | ICD-10-CM | POA: Diagnosis not present

## 2017-09-24 DIAGNOSIS — D124 Benign neoplasm of descending colon: Secondary | ICD-10-CM | POA: Diagnosis not present

## 2017-09-24 DIAGNOSIS — Z8719 Personal history of other diseases of the digestive system: Secondary | ICD-10-CM | POA: Diagnosis not present

## 2017-09-24 DIAGNOSIS — K6389 Other specified diseases of intestine: Secondary | ICD-10-CM | POA: Diagnosis not present

## 2017-09-24 DIAGNOSIS — Z1211 Encounter for screening for malignant neoplasm of colon: Secondary | ICD-10-CM | POA: Diagnosis not present

## 2017-09-24 DIAGNOSIS — Z9049 Acquired absence of other specified parts of digestive tract: Secondary | ICD-10-CM | POA: Diagnosis not present

## 2017-09-25 DIAGNOSIS — Z0001 Encounter for general adult medical examination with abnormal findings: Secondary | ICD-10-CM | POA: Diagnosis not present

## 2017-09-25 DIAGNOSIS — Z1389 Encounter for screening for other disorder: Secondary | ICD-10-CM | POA: Diagnosis not present

## 2017-09-25 DIAGNOSIS — Z23 Encounter for immunization: Secondary | ICD-10-CM | POA: Diagnosis not present

## 2017-09-25 DIAGNOSIS — Z6838 Body mass index (BMI) 38.0-38.9, adult: Secondary | ICD-10-CM | POA: Diagnosis not present

## 2017-09-25 DIAGNOSIS — J329 Chronic sinusitis, unspecified: Secondary | ICD-10-CM | POA: Diagnosis not present

## 2017-09-25 DIAGNOSIS — E782 Mixed hyperlipidemia: Secondary | ICD-10-CM | POA: Diagnosis not present

## 2017-10-04 ENCOUNTER — Encounter (HOSPITAL_COMMUNITY): Payer: Medicare Other | Attending: Hematology & Oncology | Admitting: Oncology

## 2017-10-04 ENCOUNTER — Other Ambulatory Visit: Payer: Self-pay

## 2017-10-04 ENCOUNTER — Encounter (HOSPITAL_COMMUNITY): Payer: Self-pay

## 2017-10-04 ENCOUNTER — Encounter (HOSPITAL_COMMUNITY): Payer: Medicare Other

## 2017-10-04 ENCOUNTER — Ambulatory Visit (HOSPITAL_COMMUNITY)
Admission: RE | Admit: 2017-10-04 | Discharge: 2017-10-04 | Disposition: A | Discharge status: Home / Self Care | Payer: Medicare Other | Source: Ambulatory Visit | Attending: Oncology | Admitting: Oncology

## 2017-10-04 VITALS — BP 134/66 | HR 53 | Temp 97.9°F | Resp 18 | Wt 227.2 lb

## 2017-10-04 DIAGNOSIS — R1030 Lower abdominal pain, unspecified: Secondary | ICD-10-CM | POA: Diagnosis not present

## 2017-10-04 DIAGNOSIS — E876 Hypokalemia: Secondary | ICD-10-CM | POA: Diagnosis present

## 2017-10-04 DIAGNOSIS — B370 Candidal stomatitis: Secondary | ICD-10-CM | POA: Insufficient documentation

## 2017-10-04 DIAGNOSIS — R300 Dysuria: Secondary | ICD-10-CM | POA: Insufficient documentation

## 2017-10-04 DIAGNOSIS — Z79811 Long term (current) use of aromatase inhibitors: Secondary | ICD-10-CM | POA: Diagnosis not present

## 2017-10-04 DIAGNOSIS — R109 Unspecified abdominal pain: Secondary | ICD-10-CM | POA: Diagnosis not present

## 2017-10-04 DIAGNOSIS — Z170 Estrogen receptor positive status [ER+]: Secondary | ICD-10-CM | POA: Diagnosis not present

## 2017-10-04 DIAGNOSIS — R110 Nausea: Secondary | ICD-10-CM | POA: Diagnosis present

## 2017-10-04 DIAGNOSIS — L089 Local infection of the skin and subcutaneous tissue, unspecified: Secondary | ICD-10-CM | POA: Diagnosis present

## 2017-10-04 DIAGNOSIS — C50412 Malignant neoplasm of upper-outer quadrant of left female breast: Secondary | ICD-10-CM | POA: Insufficient documentation

## 2017-10-04 LAB — COMPREHENSIVE METABOLIC PANEL
ALK PHOS: 76 U/L (ref 38–126)
ALT: 18 U/L (ref 14–54)
ANION GAP: 7 (ref 5–15)
AST: 17 U/L (ref 15–41)
Albumin: 4.4 g/dL (ref 3.5–5.0)
BILIRUBIN TOTAL: 0.5 mg/dL (ref 0.3–1.2)
BUN: 24 mg/dL — ABNORMAL HIGH (ref 6–20)
CALCIUM: 10.6 mg/dL — AB (ref 8.9–10.3)
CO2: 29 mmol/L (ref 22–32)
Chloride: 101 mmol/L (ref 101–111)
Creatinine, Ser: 0.88 mg/dL (ref 0.44–1.00)
GFR calc non Af Amer: >60 mL/min (ref >60)
Glucose, Bld: 73 mg/dL (ref 65–99)
POTASSIUM: 4.1 mmol/L (ref 3.5–5.1)
SODIUM: 137 mmol/L (ref 135–145)
TOTAL PROTEIN: 7.5 g/dL (ref 6.5–8.1)

## 2017-10-04 LAB — CBC WITH DIFFERENTIAL/PLATELET
BASOS ABS: 0 10*3/uL (ref 0.0–0.1)
BASOS PCT: 1 %
Eosinophils Absolute: 0.1 10*3/uL (ref 0.0–0.7)
Eosinophils Relative: 1 %
HEMATOCRIT: 43 % (ref 36.0–46.0)
HEMOGLOBIN: 13.5 g/dL (ref 12.0–15.0)
LYMPHS PCT: 24 %
Lymphs Abs: 1.5 10*3/uL (ref 0.7–4.0)
MCH: 31.8 pg (ref 26.0–34.0)
MCHC: 31.4 g/dL (ref 30.0–36.0)
MCV: 101.4 fL — ABNORMAL HIGH (ref 78.0–100.0)
Monocytes Absolute: 0.5 10*3/uL (ref 0.1–1.0)
Monocytes Relative: 8 %
NEUTROS ABS: 4.2 10*3/uL (ref 1.7–7.7)
NEUTROS PCT: 66 %
Platelets: 212 10*3/uL (ref 150–400)
RBC: 4.24 MIL/uL (ref 3.87–5.11)
RDW: 13.1 % (ref 11.5–15.5)
WBC: 6.2 10*3/uL (ref 4.0–10.5)

## 2017-10-04 MED ORDER — HEPARIN SOD (PORK) LOCK FLUSH 100 UNIT/ML IV SOLN
INTRAVENOUS | Status: AC
  Filled 2017-10-04: qty 5

## 2017-10-04 MED ORDER — IOPAMIDOL (ISOVUE-300) INJECTION 61%
100.0000 mL | Freq: Once | INTRAVENOUS | Status: AC | PRN
  Administered 2017-10-04: 100 mL via INTRAVENOUS

## 2017-10-04 MED ORDER — IOPAMIDOL (ISOVUE-300) INJECTION 61%
INTRAVENOUS | Status: AC
  Administered 2017-10-04: 30 mL via ORAL
  Filled 2017-10-04: qty 30

## 2017-10-04 MED ORDER — HEPARIN SOD (PORK) LOCK FLUSH 100 UNIT/ML IV SOLN
500.0000 [IU] | Freq: Once | INTRAVENOUS | Status: AC
  Administered 2017-10-04: 500 [IU] via INTRAVENOUS

## 2017-10-04 MED ORDER — SODIUM CHLORIDE 0.9% FLUSH
10.0000 mL | INTRAVENOUS | Status: AC | PRN
  Administered 2017-10-04: 10 mL via INTRAVENOUS
  Filled 2017-10-04: qty 10

## 2017-10-04 NOTE — Patient Instructions (Signed)
[place] at [facility] Discharge Instructions  RECOMMENDATIONS MADE BY THE CONSULTANT AND ANY TEST RESULTS WILL BE SENT TO YOUR REFERRING PHYSICIAN.  Port flush done and power port accessed for scan today Labs drawn also.  Thank you for choosing [place] at [facility] to provide your oncology and hematology care.  To afford each patient quality time with our provider, please arrive at least 15 minutes before your scheduled appointment time.    If you have a lab appointment with the [facility] please come in thru the  Main Entrance and check in at the main information desk  You need to re-schedule your appointment should you arrive 10 or more minutes late.  We strive to give you quality time with our providers, and arriving late affects you and other patients whose appointments are after yours.  Also, if you no show three or more times for appointments you may be dismissed from the clinic at the providers discretion.     Again, thank you for choosing [facility].  Our hope is that these requests will decrease the amount of time that you wait before being seen by our physicians.       _____________________________________________________________  Should you have questions after your visit to [facility], please contact our office at [phone] between the hours of 8:30 a.m. and 4:30 p.m.  Voicemails left after 4:30 p.m. will not be returned until the following business day.  For prescription refill requests, have your pharmacy contact our office.       Resources For Cancer Patients and their Caregivers ? American Cancer Society: Can assist with transportation, wigs, general needs, runs Look Good Feel Better.        531-220-9767 ? Cancer Care: Provides financial assistance, online support groups, medication/co-pay assistance.  1-800-813-HOPE 7377885749) ? [place] Assists [place] cancer patients and their families through emotional , educational and financial support.  (360) 444-7291 ? [place] DSS Where to apply for food stamps, Medicaid and utility assistance. 332-249-3205 ? RCATS: Transportation to medical appointments. 680-080-9521 ? Social Security Administration: May apply for disability if have a Stage IV cancer. 431-373-4060 (450)079-7367 ? [employer], Disability and Transit Services: Assists with nutrition, care and transit needs. [place] Support Programs: @10RELATIVEDAYS @ > Cancer Support Group  2nd Tuesday of the month 1pm-2pm, Journey Room  > Creative Journey  3rd Tuesday of the month 1130am-1pm, Journey Room  > Look Good Feel Better  1st Wednesday of the month 10am-12 noon, Journey Room (Call [place] to register [phone])

## 2017-10-04 NOTE — Progress Notes (Unsigned)
Tracy Valentine presented for Portacath access and flush. Portacath located right chest wall accessed with  H 20 needle. No blood return and no resistance met, flushed several times Portacath flushed with 57m NS Procedure without incident. Patient tolerated procedure well.   Labs drawn by venipuncture.

## 2017-10-04 NOTE — Progress Notes (Addendum)
Tracy Valentine Kitchen  HEMATOLOGY ONCOLOGY PROGRESS NOTE  Date of service: .01/10/2017  Patient Care Team: [employer], MD as PCP - General (Internal Medicine)  CC: f/u for left breast cancer ER positive PR negative HER-2/neu negative   SUMMARY OF ONCOLOGIC HISTORY:   Breast cancer of upper-outer quadrant of left female breast ([place])   07/26/2016 Mammogram    Area of developing asymmetry with distortion located within the upper-outer quadrant left breast. Tissue sampling via tomosynthesis guided biopsy is recommended and will be scheduled.  RECOMMENDATION: Left breast tomosynthesis guided biopsy. This has been scheduled for 07/28/2016.       07/28/2016 Initial Biopsy    Coil shaped clip corresponds to the asymmetry in the outer left breast. Clip is well positioned at the biopsy site. 2. X shaped clip corresponds to the ASYMMETRY/DISTORTION in the upper-outer quadrant of the left breast. Clip is positioned approximately 1 cm medial to the center of the biopsy cavity      07/28/2016 Pathology Results    Estrogen Receptor: 80%, POSITIVE, MODERATE STAINING INTENSITY Progesterone Receptor: 0%, NEGATIVE Proliferation Marker Ki67: 70% HER 2 negative by FISH Breast, left, needle core biopsy, upper outer quadrant - INVASIVE DUCTAL CARCINOMA, SEE COMMENT. - HIGH GRADE DUCTAL CARCINOMA IN SITU WITH NECROSIS.      08/24/2016 Oncotype testing    Recurrence Score Result 30, 10 year risk of distant recurrence Tamoxifen alone 20%      10/03/2016 - 12/05/2016 Chemotherapy    The patient had palonosetron (ALOXI) injection 0.25 mg, 0.25 mg, Intravenous,  Once, 2 of 4 cycles Administration: 0.25 mg (10/24/2016)  pegfilgrastim (NEULASTA ONPRO KIT) injection 6 mg, 6 mg, Subcutaneous, Once, 2 of 4 cycles Administration: 6 mg (10/24/2016)  cyclophosphamide (CYTOXAN) 1,380 mg in sodium chloride 0.9 % 250 mL chemo infusion, 600 mg/m2 = 1,380 mg, Intravenous,  Once, 2 of 4 cycles Administration: 1,380 mg  (10/24/2016)  DOCEtaxel (TAXOTERE) 170 mg in dextrose 5 % 250 mL chemo infusion, 75 mg/m2 = 170 mg, Intravenous,  Once, 2 of 4 cycles Dose modification: 65 mg/m2 (original dose 75 mg/m2, Cycle 2, Reason: Dose not tolerated)  fosaprepitant (EMEND) 150 mg, dexamethasone (DECADRON) 12 mg in sodium chloride 0.9 % 145 mL IVPB, , Intravenous,  Once, 1 of 3 cycles Administration:  (10/24/2016)  for chemotherapy treatment.         - 02/06/2017 Radiation Therapy    Completed adjuvant breast RT       02/26/2017 -  Anti-estrogen oral therapy    Started anastrozole      03/05/2017 Pathology Results    BCI-high risk of late recurrence (years 5-10) at 10.6% with a high likelihood of benefit from extended endocrine therapy.  High overall risk of recurrence (years 0-10) 19.5% and a high likelihood of benefit from extended endocrine therapy.       INTERVAL HISTORY:  Tracy Valentine 61 y.o. female is here for follow up of left breast cancer ER+/PR-/HER2-. Ki67 is 70%.  She has completed her 4 cycles of adjuvant TC for high-grade early stage breast cancer with a high Oncotype DX score and high grade DCIS.   Tracy Valentine presents today for continuing follow up. She is tolerating arimidex well. She denies any hot flashes or arthralgias, however she states she does have emotional mood swings from it which has not worsened. She is taking 2 TUMS a day for calcium supplementation and takes vitamin D. She denies any changes in appetite, shortness of breath, chest pain, abdominal pain, focal weakness.  Since her last visit, she has had a colonoscopy and was told she had a "potato sized mass" in her colon which was comopletely resected by GI. The path report results is not back yet per patient on this mass, but she states Dr. Liliane Valentine office is supposed to call her with the results.  Patient states that she has also been having intermittent left lower quadrant pain/pelvic pain.  She is concerned that there may be  something going on with her ovaries.  She states she has seen GYN in the past and has gone a transvaginal ultrasound performed which were all normal.  She states that she has been having intermittent vaginal bleeding for the past 3-4 years of unknown etiology.   REVIEW OF SYSTEMS:   Review of Systems  Constitutional: Negative.  Negative for chills and fever.  HENT: Negative.  Negative for hearing loss, sore throat and tinnitus.   Eyes: Negative.  Negative for blurred vision, photophobia and discharge.  Respiratory: Negative.  Negative for cough, hemoptysis, shortness of breath and wheezing.   Cardiovascular: Negative.  Negative for chest pain, palpitations, orthopnea, claudication and leg swelling.  Gastrointestinal: Negative.  Negative for abdominal pain, constipation, diarrhea, melena, nausea and vomiting.  Genitourinary: Negative.  Negative for dysuria and hematuria.  Musculoskeletal: Negative.  Negative for back pain, joint pain and myalgias.  Skin: Negative.  Negative for itching and rash.  Neurological: Negative.  Negative for dizziness, weakness and headaches.  Endo/Heme/Allergies: Negative.  Negative for environmental allergies and polydipsia. Does not bruise/bleed easily.  Psychiatric/Behavioral: Negative.  Negative for depression. The patient is not nervous/anxious and does not have insomnia.   All other systems reviewed and are negative. 14 Point review of systems of done and is negative except as noted above.  . Past Medical History:  Diagnosis Date  . Anxiety   . Breast cancer (HCC)    breast  . Chronic pain   . DDD (degenerative disc disease), cervical   . DDD (degenerative disc disease), lumbar   . Degenerative disc disease at L5-S1 level   . Depression   . GERD (gastroesophageal reflux disease)   . History of kidney stones   . Hyperlipidemia   . Hypothyroidism   . Obesity   . Osteoarthritis   . Personal history of chemotherapy   . PONV (postoperative nausea and  vomiting)   . Sleep apnea    uses CPAP  . Spinal stenosis   . Spinal stenosis     . Past Surgical History:  Procedure Laterality Date  . ABDOMINAL HYSTERECTOMY     partial hyst, still have ovaries  . BREAST LUMPECTOMY Left 08/24/2016  . BREAST LUMPECTOMY WITH RADIOACTIVE SEED AND SENTINEL LYMPH NODE BIOPSY Left 08/24/2016   Procedure: LEFT BREAST PARTIAL MASTECTOMY WITH RADIOACTIVE SEED AND LEFT SENTINEL LYMPH NODE MAPPING;  Surgeon: Erroll Luna, MD;  Location: [facility];  Service: General;  Laterality: Left;  . CHOLECYSTECTOMY    . FOOT FOREIGN BODY REMOVAL Left   . PORTACATH PLACEMENT Right 09/29/2016   Procedure: INSERTION PORT-A-CATH RIGHT SUBCLAVIAN;  Surgeon: Aviva Signs, MD;  Location: [facility];  Service: General;  Laterality: Right;    . Social History   Tobacco Use  . Smoking status: Never Smoker  . Smokeless tobacco: Never Used  Substance Use Topics  . Alcohol use: No  . Drug use: No    ALLERGIES:  is allergic to adhesive [tape]; amoxicillin; darvocet [propoxyphene n-acetaminophen]; doxycycline; erythromycin; shellfish allergy; zithromax [azithromycin]; and latex.  MEDICATIONS:  Current Outpatient Medications  Medication Sig Dispense Refill  . ALPRAZolam (XANAX) 1 MG tablet Take 1 mg by mouth 4 (four) times daily as needed for sleep.    Tracy Valentine Kitchen anastrozole (ARIMIDEX) 1 MG tablet Take 1 tablet (1 mg total) by mouth daily. 90 tablet 2  . aspirin 325 MG tablet Take 325 mg by mouth daily.    . calcium carbonate (TUMS - DOSED IN MG ELEMENTAL CALCIUM) 500 MG chewable tablet Chew 2 tablets by mouth 2 (two) times daily.    Tracy Valentine Kitchen docusate sodium (COLACE) 50 MG capsule Take 100 mg by mouth 2 (two) times daily.    . fluticasone (FLONASE) 50 MCG/ACT nasal spray Place 1 spray into both nostrils daily.    Tracy Valentine Kitchen ipratropium (ATROVENT) 0.03 % nasal spray 1 spray daily.  4  . KRILL OIL PO Take 1 capsule by mouth daily.    Tracy Valentine Kitchen levothyroxine (SYNTHROID, LEVOTHROID) 100 MCG tablet  Take 100 mcg by mouth daily.  5  . lidocaine-prilocaine (EMLA) cream Apply a quarter size amount to port site 1 hour prior to chemo. Do not rub in. Cover with plastic wrap. 30 g 3  . montelukast (SINGULAIR) 10 MG tablet Take 10 mg by mouth daily.  11  . ondansetron (ZOFRAN) 8 MG tablet Take 1 tablet (8 mg total) by mouth every 8 (eight) hours as needed for nausea or vomiting. 30 tablet 2  . Oxycodone HCl 10 MG TABS   0  . prochlorperazine (COMPAZINE) 10 MG tablet Take 1 tablet (10 mg total) by mouth every 6 (six) hours as needed for nausea or vomiting. 30 tablet 2  . senna (SENOKOT) 8.6 MG TABS tablet Take 1 tablet by mouth at bedtime as needed.    . simvastatin (ZOCOR) 20 MG tablet Take 20 mg by mouth every evening.    . venlafaxine (EFFEXOR) 75 MG tablet Take 75 mg by mouth 2 (two) times daily.    Tracy Valentine Kitchen zolpidem (AMBIEN) 10 MG tablet Take 10 mg by mouth at bedtime.     No current facility-administered medications for this visit.     PHYSICAL EXAMINATION: ECOG PERFORMANCE STATUS: 1 - Symptomatic but completely ambulatory  Vitals:   10/04/17 1031  BP: 134/66  Pulse: (!) 53  Resp: 18  Temp: 97.9 F (36.6 C)  SpO2: 100%   Filed Weights   10/04/17 1031  Weight: 227 lb 3.2 oz (103.1 kg)     Physical Exam  Constitutional: She is oriented to person, place, and time and well-developed, well-nourished, and in no distress. No distress.  HENT:  Head: Normocephalic and atraumatic.  Mouth/Throat: No oropharyngeal exudate.  Eyes: Conjunctivae and EOM are normal. Pupils are equal, round, and reactive to light. No scleral icterus.  Neck: Normal range of motion. Neck supple. No JVD present.  Cardiovascular: Normal rate, regular rhythm and normal heart sounds. Exam reveals no gallop and no friction rub.  No murmur heard. Pulmonary/Chest: Effort normal and breath sounds normal. No respiratory distress. She has no wheezes. She has no rales.  Abdominal: Soft. Bowel sounds are normal. She exhibits  no distension. There is tenderness (LLQ TTP). There is no guarding.  Musculoskeletal: Normal range of motion. She exhibits no edema or tenderness.  Lymphadenopathy:    She has no cervical adenopathy.  Neurological: She is alert and oriented to person, place, and time. No cranial nerve deficit. Gait normal.  Skin: Skin is warm and dry. No rash noted. No erythema. No pallor.  Psychiatric: Affect and judgment  normal.  Nursing note and vitals reviewed. BREAST EXAM: left lumpectomy scar well healed, no masses or skin changes noted. Right breast without masses or skin changes. No axillary lymphadenopathy bilaterally.  LABORATORY DATA:   I have reviewed the data as listed  . CBC Latest Ref Rng & Units 07/10/2017 12/05/2016 11/14/2016  WBC 4.0 - 10.5 K/uL 4.9 16.5(H) 18.1(H)  Hemoglobin 12.0 - 15.0 g/dL 13.5 12.1 12.1  Hematocrit 36.0 - 46.0 % 41.1 36.6 36.4  Platelets 150 - 400 K/uL 192 275 234    . CMP Latest Ref Rng & Units 07/10/2017 12/05/2016 11/14/2016  Glucose 65 - 99 mg/dL 99 149(H) 138(H)  BUN 6 - 20 mg/dL '14 12 14  ' Creatinine 0.44 - 1.00 mg/dL 0.78 0.78 0.69  Sodium 135 - 145 mmol/L 138 138 137  Potassium 3.5 - 5.1 mmol/L 4.0 3.8 4.1  Chloride 101 - 111 mmol/L 102 102 105  CO2 22 - 32 mmol/L '27 27 25  ' Calcium 8.9 - 10.3 mg/dL 10.0 10.2 9.6  Total Protein 6.5 - 8.1 g/dL 7.3 6.7 6.4(L)  Total Bilirubin 0.3 - 1.2 mg/dL 0.5 0.5 0.4  Alkaline Phos 38 - 126 U/L 71 69 65  AST 15 - 41 U/L '18 20 19  ' ALT 14 - 54 U/L '18 18 20     ' RADIOGRAPHIC STUDIES: I have personally reviewed the radiological images as listed and agreed with the findings in the report.  DUAL X-RAY ABSORPTIOMETRY (DXA) FOR BONE MINERAL DENSITY 02/14/2017  IMPRESSION: Ordering Physician:  Dr. Brunetta Genera,  Your patient Cimone Fahey completed a BMD test on 02/14/2017 using the [place] (software version: 14.10) manufactured by [employer]. The following summarizes the results  of our evaluation. PATIENT BIOGRAPHICAL: Name: Tracy Valentine, Tracy Valentine Patient ID: [id number] Birth Date: 1956/05/24 Height: 66.0 in. Gender: Female Exam Date: 02/14/2017 Weight: 249.0 lbs. Indications: Breast Ca, Caucasian, Height Loss, History of Fracture (Adult), Partial Hysterectomy, Post Menopausal, Vitamin D Deficiency Fractures: Ankle Treatments: Asprin, Synthroid, Vitamin D DENSITOMETRY RESULTS: Site         Region     Measured Date Measured Age WHO Classification Young Adult T-score BMD         %Change vs. Previous Significant Change (*) DualFemur Neck Left 4/14/[age] 58.6 Normal -0.2 1.005 g/cm2 - -  Left Forearm Radius 33% 4/14/2017 58.6 Normal 1.1 0.795 g/cm2 - - ASSESSMENT: BMD as determined from Femur Neck Left is 1.005 g/cm2 with a T-Score of -0.2. This patient is considered normal according to [facility]) criteria. (Lumbar spine was not utilized due to advanced degenerative changes.) World Pharmacologist (WHO) criteria for post-menopausal, Caucasian Women: Normal:       T-score at or above -1 SD Osteopenia:   T-score between -1 and -2.5 SD Osteoporosis: T-score at or below -2.5 SD     ASSESSMENT & PLAN:   61 y.o. female with   1) Early Stage pT1C,pN0, Mx (Stage IA) with high-grade ER positive PR negative HER-2/neu negative breast cancer status post left breast lumpectomy, adjuvant chemotherapy with TCx4 cycles. High-grade DCIS Oncotype recurrence score of 30 Baseline DEXA: normal  BCI-high risk  PLAN: - Clinically NED. Last mammogram from 07/2017 was negative for malignancy. Repeat mammogram in 1 year. - Continue endocrine therapy with anastrozole.  BCI high so patient will benefit from 10 years of endocrine therapy. - Continue TUMS and vitamin D. - Patient recently states she had a colonic mass removed during routine screening colonoscopy by  Dr. Earlean Shawl. Will request that they fax over a copy of the colonoscopy and path report to make  sure this was not a malignancy. - Refer to genetic counselor per patient's request for genetic testing. -Stat CT abd/pelvis with contrast for LLQ abd/pelvic pain. R/o GYN malignancy since patient states she has been having intermittent vaginal bleeding for the past 3-4 years. Follow up with GYN. - RTC in 6 months for follow up with labs. - Port flush q8 weeks   Twana First, MD    Addendum: CT abd/pelvis was performed and was negative for any abnormalities. Received path report from Dr. Liliane Valentine office regarding her polypectomy. Descending colon polypectomy demonstrated one fragment of tubular adenoma, one fragment of inflammatory polyp, and one fragment of benign colonic mucosa.  10/05/17 12:24 PM

## 2017-10-04 NOTE — Patient Instructions (Signed)
[facility] Discharge Instructions  RECOMMENDATIONS MADE BY THE CONSULTANT AND ANY TEST RESULTS WILL BE SENT TO YOUR REFERRING PHYSICIAN.  You were seen today by Dr. Louise Zhou    Thank you for choosing  Cancer Center at [facility] to provide your oncology and hematology care.  To afford each patient quality time with our provider, please arrive at least 15 minutes before your scheduled appointment time.    If you have a lab appointment with the Cancer Center please come in thru the  Main Entrance and check in at the main information desk  You need to re-schedule your appointment should you arrive 10 or more minutes late.  We strive to give you quality time with our providers, and arriving late affects you and other patients whose appointments are after yours.  Also, if you no show three or more times for appointments you may be dismissed from the clinic at the providers discretion.     Again, thank you for choosing [facility].  Our hope is that these requests will decrease the amount of time that you wait before being seen by our physicians.       _____________________________________________________________  Should you have questions after your visit to [facility], please contact our office at [phone] between the hours of 8:30 a.m. and 4:30 p.m.  Voicemails left after 4:30 p.m. will not be returned until the following business day.  For prescription refill requests, have your pharmacy contact our office.       Resources For Cancer Patients and their Caregivers ? American Cancer Society: Can assist with transportation, wigs, general needs, runs Look Good Feel Better.        1-888-227-6333 ? Cancer Care: Provides financial assistance, online support groups, medication/co-pay assistance.  1-800-813-HOPE (4673) ? [facility] Assists [place] cancer patients and their  families through emotional , educational and financial support.  336-427-4357 ? [place] DSS Where to apply for food stamps, Medicaid and utility assistance. 336-342-1394 ? [facility]: Transportation to medical appointments. 336-347-2287 ? Social Security Administration: May apply for disability if have a Stage IV cancer. 336-342-7796 1-800-772-1213 ? [place] Aging, Disability and Transit Services: Assists with nutrition, care and transit needs. 336-349-2343  Cancer Center Support Programs: @10RELATIVEDAYS@ > Cancer Support Group  2nd Tuesday of the month 1pm-2pm, Journey Room  > Creative Journey  3rd Tuesday of the month 1130am-1pm, Journey Room  > Look Good Feel Better  1st Wednesday of the month 10am-12 noon, Journey Room (Call American Cancer Society to register 1-800-395-5775)    

## 2017-10-05 ENCOUNTER — Other Ambulatory Visit (HOSPITAL_COMMUNITY): Payer: Medicare Other

## 2017-10-05 ENCOUNTER — Ambulatory Visit (HOSPITAL_COMMUNITY): Payer: Medicare Other

## 2017-10-05 MED ORDER — PROCHLORPERAZINE MALEATE 10 MG PO TABS
ORAL_TABLET | ORAL | Status: AC
  Filled 2017-10-05: qty 1

## 2017-10-14 IMAGING — US US EXTREM UP*L* LTD
1 series · 4 of 4 positions shown · non-contrast
Comparison: None

CLINICAL DATA: LEFT arm mass, palpable superficial and slightly
reddened focus at the LEFT antecubital fossa

EXAM:
ULTRASOUND LEFT UPPER EXTREMITY LIMITED
TECHNIQUE: Ultrasound examination of the upper extremity soft tissues was
performed in the area of clinical concern at the medial aspect of
the LEFT antecubital fossa just distal to the elbow crease.

[Series 1: us extrem up*left* ltd · 0.07mm/px · 4 of 4 slices shown]
[im 1/4]
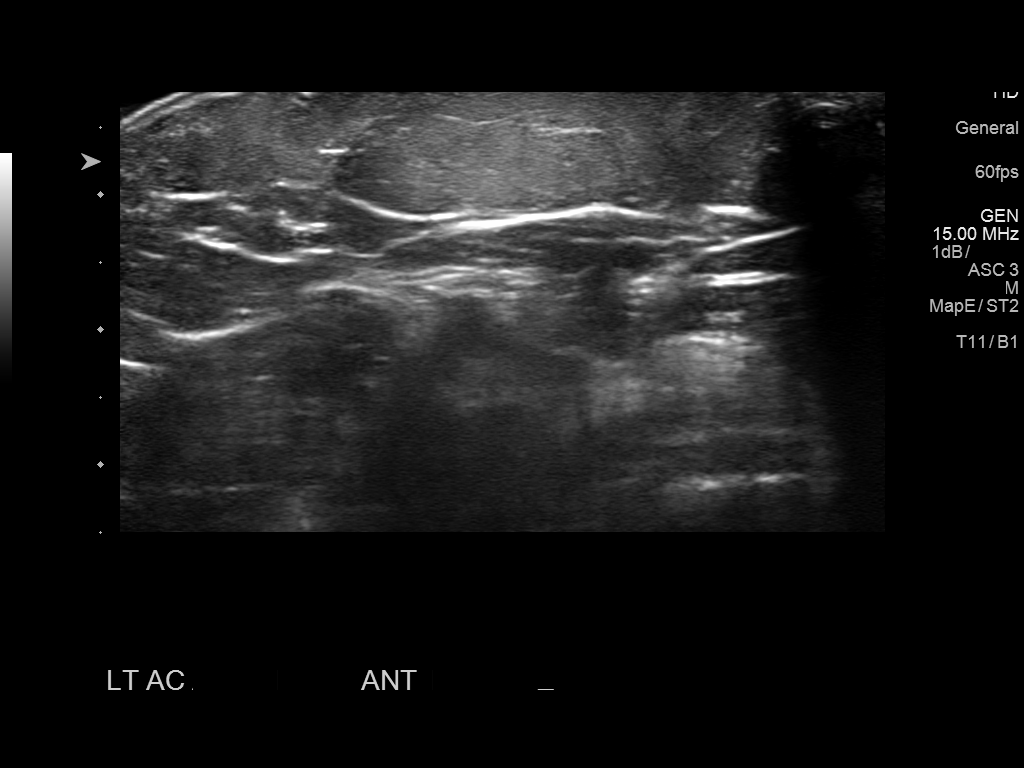
[im 2/4]
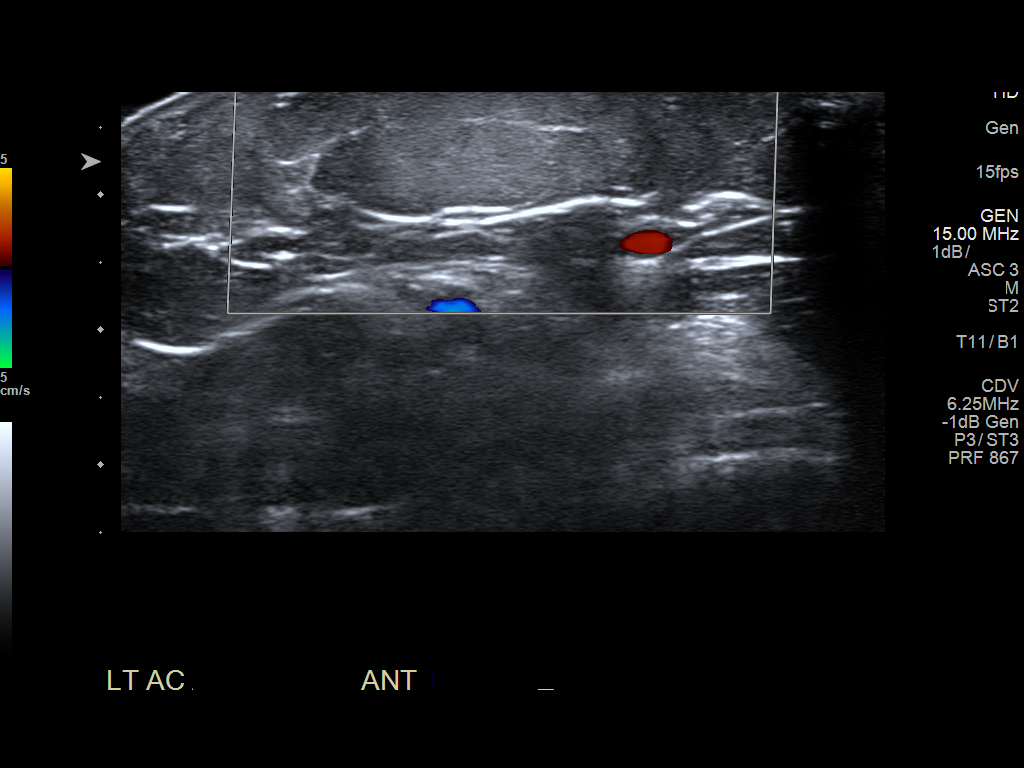
[im 3/4]
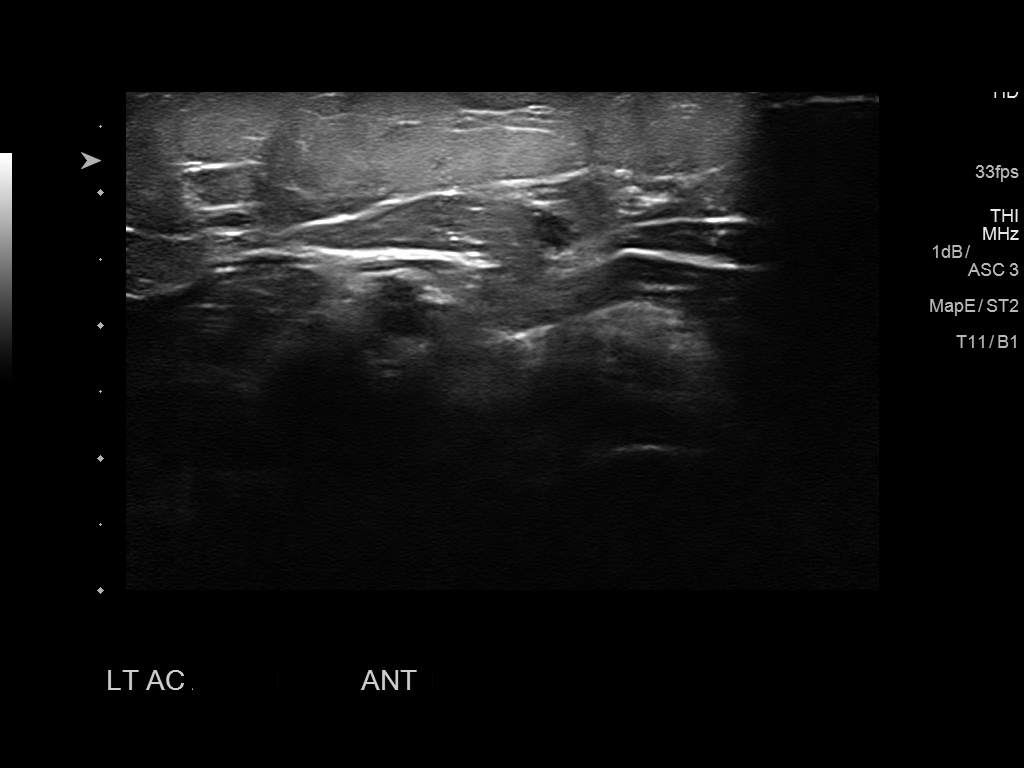
[im 4/4]
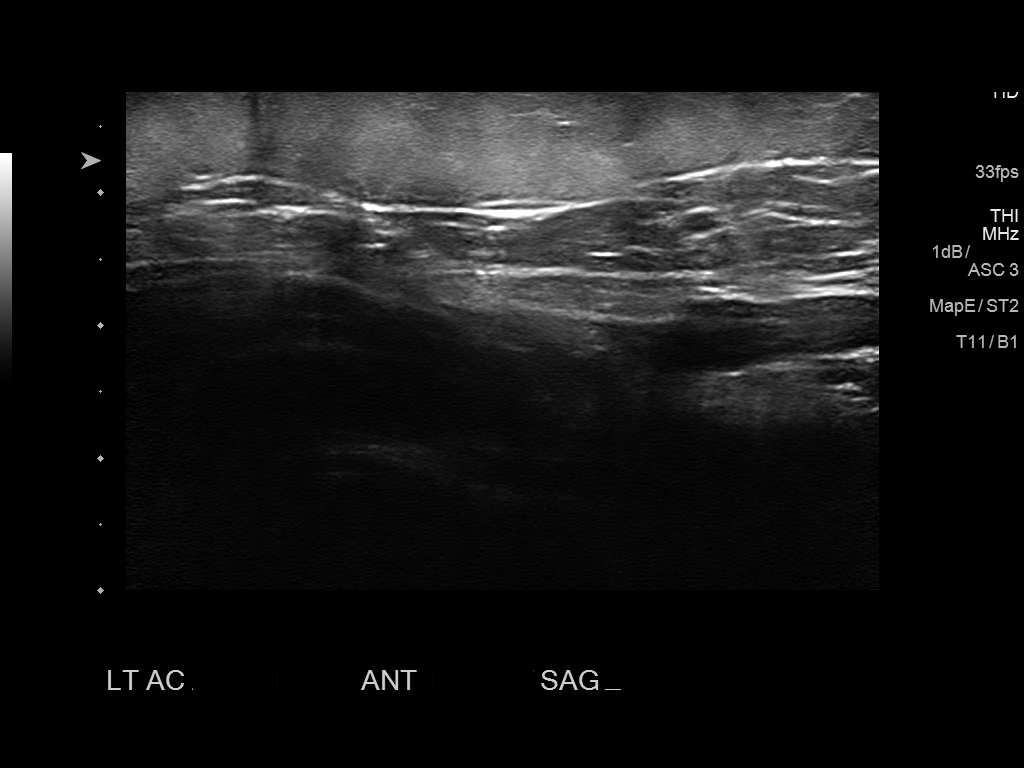

[4 of 4 positions shown; findings below may reference images not displayed]

FINDINGS: At the site of clinical concern, normal-appearing subcutaneous fat
lobules are identified.

No mass, cyst, or lymph node identified.

No regional hyperemia on color Doppler imaging or abnormal fluid
collection.

No abnormal appearing subcutaneous vessels identified.
IMPRESSION: Negative ultrasound of the site of clinical concern at the LEFT
antecubital fossa; recommend clinical management.

If patient has persistent symptoms or enlarging palpable
abnormality, could consider MR for further assessment.

## 2017-10-19 DIAGNOSIS — G4733 Obstructive sleep apnea (adult) (pediatric): Secondary | ICD-10-CM | POA: Diagnosis not present

## 2017-10-24 DIAGNOSIS — M47812 Spondylosis without myelopathy or radiculopathy, cervical region: Secondary | ICD-10-CM | POA: Diagnosis not present

## 2017-10-24 DIAGNOSIS — M5416 Radiculopathy, lumbar region: Secondary | ICD-10-CM | POA: Diagnosis not present

## 2017-10-24 DIAGNOSIS — M47816 Spondylosis without myelopathy or radiculopathy, lumbar region: Secondary | ICD-10-CM | POA: Diagnosis not present

## 2017-10-24 DIAGNOSIS — G894 Chronic pain syndrome: Secondary | ICD-10-CM | POA: Diagnosis not present

## 2017-10-26 ENCOUNTER — Other Ambulatory Visit (HOSPITAL_COMMUNITY): Payer: Self-pay | Admitting: *Deleted

## 2017-10-26 DIAGNOSIS — Z170 Estrogen receptor positive status [ER+]: Principal | ICD-10-CM

## 2017-10-26 DIAGNOSIS — C50412 Malignant neoplasm of upper-outer quadrant of left female breast: Secondary | ICD-10-CM

## 2017-10-26 MED ORDER — PROCHLORPERAZINE MALEATE 10 MG PO TABS: 10.0000 mg | ORAL_TABLET | Freq: Four times a day (QID) | ORAL | 2 refills | Status: DC | PRN

## 2017-11-01 ENCOUNTER — Inpatient Hospital Stay (HOSPITAL_COMMUNITY): Payer: Medicare Other | Attending: Genetic Counselor | Admitting: Genetic Counselor

## 2017-11-01 ENCOUNTER — Other Ambulatory Visit (HOSPITAL_COMMUNITY): Payer: Medicare Other

## 2017-11-01 ENCOUNTER — Encounter (HOSPITAL_COMMUNITY): Payer: Self-pay | Admitting: Genetic Counselor

## 2017-11-01 DIAGNOSIS — Z170 Estrogen receptor positive status [ER+]: Secondary | ICD-10-CM | POA: Diagnosis not present

## 2017-11-01 DIAGNOSIS — Z8049 Family history of malignant neoplasm of other genital organs: Secondary | ICD-10-CM

## 2017-11-01 DIAGNOSIS — Z8041 Family history of malignant neoplasm of ovary: Secondary | ICD-10-CM | POA: Insufficient documentation

## 2017-11-01 DIAGNOSIS — C50412 Malignant neoplasm of upper-outer quadrant of left female breast: Secondary | ICD-10-CM

## 2017-11-01 NOTE — Progress Notes (Signed)
REFERRING PROVIDER: Twana First, MD [place], [place] [id number]  PRIMARY PROVIDER:  Redmond School, MD  PRIMARY REASON FOR VISIT:  1. Malignant neoplasm of upper-outer quadrant of left breast in female, estrogen receptor positive ([place])   2. Family history of ovarian cancer   3. Family history of uterine cancer      HISTORY OF PRESENT ILLNESS:   Tracy Valentine, a 62 y.o. female, was seen for a  cancer genetics consultation at the request of Dr. Talbert Cage due to a personal and family history of cancer.  Tracy Valentine presents to clinic today to discuss the possibility of a hereditary predisposition to cancer, genetic testing, and to further clarify her future cancer risks, as well as potential cancer risks for family members.   In 2017, at the age of 77, Tracy Valentine was diagnosed with invasive ductal carcinoma of the breast. This was treated with lumpectomy, chemotherapy and radiation.  She is currently on tamoxifen.      CANCER HISTORY:    Breast cancer of upper-outer quadrant of left female breast ([place])   07/26/2016 Mammogram    Area of developing asymmetry with distortion located within the upper-outer quadrant left breast. Tissue sampling via tomosynthesis guided biopsy is recommended and will be scheduled.  RECOMMENDATION: Left breast tomosynthesis guided biopsy. This has been scheduled for 07/28/2016.       07/28/2016 Initial Biopsy    Coil shaped clip corresponds to the asymmetry in the outer left breast. Clip is well positioned at the biopsy site. 2. X shaped clip corresponds to the ASYMMETRY/DISTORTION in the upper-outer quadrant of the left breast. Clip is positioned approximately 1 cm medial to the center of the biopsy cavity      07/28/2016 Pathology Results    Estrogen Receptor: 80%, POSITIVE, MODERATE STAINING INTENSITY Progesterone Receptor: 0%, NEGATIVE Proliferation Marker Ki67: 70% HER 2 negative by FISH Breast, left, needle core biopsy,  upper outer quadrant - INVASIVE DUCTAL CARCINOMA, SEE COMMENT. - HIGH GRADE DUCTAL CARCINOMA IN SITU WITH NECROSIS.      08/24/2016 Oncotype testing    Recurrence Score Result 30, 10 year risk of distant recurrence Tamoxifen alone 20%      10/03/2016 - 12/05/2016 Chemotherapy    The patient had palonosetron (ALOXI) injection 0.25 mg, 0.25 mg, Intravenous,  Once, 2 of 4 cycles Administration: 0.25 mg (10/24/2016)  pegfilgrastim (NEULASTA ONPRO KIT) injection 6 mg, 6 mg, Subcutaneous, Once, 2 of 4 cycles Administration: 6 mg (10/24/2016)  cyclophosphamide (CYTOXAN) 1,380 mg in sodium chloride 0.9 % 250 mL chemo infusion, 600 mg/m2 = 1,380 mg, Intravenous,  Once, 2 of 4 cycles Administration: 1,380 mg (10/24/2016)  DOCEtaxel (TAXOTERE) 170 mg in dextrose 5 % 250 mL chemo infusion, 75 mg/m2 = 170 mg, Intravenous,  Once, 2 of 4 cycles Dose modification: 65 mg/m2 (original dose 75 mg/m2, Cycle 2, Reason: Dose not tolerated)  fosaprepitant (EMEND) 150 mg, dexamethasone (DECADRON) 12 mg in sodium chloride 0.9 % 145 mL IVPB, , Intravenous,  Once, 1 of 3 cycles Administration:  (10/24/2016)  for chemotherapy treatment.         - 02/06/2017 Radiation Therapy    Completed adjuvant breast RT       02/26/2017 -  Anti-estrogen oral therapy    Started anastrozole      03/05/2017 Pathology Results    BCI-high risk of late recurrence (years 5-10) at 10.6% with a high likelihood of benefit from extended endocrine therapy.  High overall risk of recurrence (years  0-10) 19.5% and a high likelihood of benefit from extended endocrine therapy.        HORMONAL RISK FACTORS:  Menarche was at age 30.  First live birth at age 67.  OCP use for approximately 11 years.  Ovaries intact: yes.  Hysterectomy: yes.  Menopausal status: postmenopausal.  HRT use: 0 years. Colonoscopy: yes; One large polyp. Mammogram within the last year: yes. Number of breast biopsies: 1. Up to date with pelvic exams:  yes. Any  excessive radiation exposure in the past:  no  Past Medical History:  Diagnosis Date  . Anxiety   . Breast cancer (HCC)    breast  . Chronic pain   . DDD (degenerative disc disease), cervical   . DDD (degenerative disc disease), lumbar   . Degenerative disc disease at L5-S1 level   . Depression   . Family history of ovarian cancer   . Family history of uterine cancer   . GERD (gastroesophageal reflux disease)   . History of kidney stones   . Hyperlipidemia   . Hypothyroidism   . Obesity   . Osteoarthritis   . Personal history of chemotherapy   . PONV (postoperative nausea and vomiting)   . Sleep apnea    uses CPAP  . Spinal stenosis   . Spinal stenosis     Past Surgical History:  Procedure Laterality Date  . ABDOMINAL HYSTERECTOMY     partial hyst, still have ovaries  . BREAST LUMPECTOMY Left 08/24/2016  . BREAST LUMPECTOMY WITH RADIOACTIVE SEED AND SENTINEL LYMPH NODE BIOPSY Left 08/24/2016   Procedure: LEFT BREAST PARTIAL MASTECTOMY WITH RADIOACTIVE SEED AND LEFT SENTINEL LYMPH NODE MAPPING;  Surgeon: Erroll Luna, MD;  Location: [facility];  Service: General;  Laterality: Left;  . CHOLECYSTECTOMY    . FOOT FOREIGN BODY REMOVAL Left   . PORTACATH PLACEMENT Right 09/29/2016   Procedure: INSERTION PORT-A-CATH RIGHT SUBCLAVIAN;  Surgeon: Aviva Signs, MD;  Location: [facility];  Service: General;  Laterality: Right;    Social History   Socioeconomic History  . Marital status: Married    Spouse name: Not on file  . Number of children: Not on file  . Years of education: Not on file  . Highest education level: Not on file  Social Needs  . Financial resource strain: Not on file  . Food insecurity - worry: Not on file  . Food insecurity - inability: Not on file  . Transportation needs - medical: Not on file  . Transportation needs - non-medical: Not on file  Occupational History  . Not on file  Tobacco Use  . Smoking status: Never Smoker  . Smokeless  tobacco: Never Used  Substance and Sexual Activity  . Alcohol use: No  . Drug use: No  . Sexual activity: Yes    Birth control/protection: None    Comment: married-1 grown son  Other Topics Concern  . Not on file  Social History Narrative  . Not on file     FAMILY HISTORY:  We obtained a detailed, 4-generation family history.  Significant diagnoses are listed below: Family History  Problem Relation Age of Onset  . COPD Mother   . Ovarian cancer Mother 50  . Uterine cancer Mother 78  . Heart attack Father 82  . Diabetes Sister   . Heart disease Sister   . Kidney cancer Brother 76  . Breast cancer Cousin   . Kidney failure Sister   . Thyroid disease Sister   . Ovarian  cancer Maternal Grandmother 41  . Cancer Paternal Aunt        NOS  . Cancer Paternal Uncle        NOS  . Colon cancer Other        2 distant cousins with colon cancer in their 80s    The patient has one son who is cancer free.  She has a full sister, and two paternal half siblings.  Her half brother had kidney cancer at 32.  Both parents are deceased.    The patient's mother had uterine cancer at 23 and possibly ovarian cancer as well.  She had one full brother and four maternal half siblings - two brother and two sisters.  None had cancer.  The maternal grandparents are deceased.  The grandfather died young from a gun shot wound, and the grandmother died of ovarian cancer.  The patient's father died of a heart attack at 1.  He had four sisters and three brothers.  One brother and one sister had an unknown form of cancer.  One sister had two grandchildren who had colon cancer in their 34's and died.  The paternal grandparents died from non cancer related issues.  Tracy Valentine is unaware of previous family history of genetic testing for hereditary cancer risks. Patient's maternal ancestors are of Caucasian descent, and paternal ancestors are of Caucasian descent. There is no reported Ashkenazi Jewish ancestry.  There is no known consanguinity.  GENETIC COUNSELING ASSESSMENT: Tracy Valentine is a 62 y.o. female with a personal and family history of cancer which is somewhat suggestive of a hereditary cancer syndrome and predisposition to cancer. We, therefore, discussed and recommended the following at today's visit.   DISCUSSION: We discussed that about 5-10% of breast cancer is hereditary with most cases due to BRCA mutations.  Based on her family history of ovarian and early uterine cancer we also discussed Lynch syndrome.  We reviewed the characteristics, features and inheritance patterns of hereditary cancer syndromes. We also discussed genetic testing, including the appropriate family members to test, the process of testing, insurance coverage and turn-around-time for results. We discussed the implications of a negative, positive and/or variant of uncertain significant result. We recommended Tracy Valentine pursue genetic testing for the Multi-Cancer gene panel. The Multi-Gene Panel offered by [facility] includes sequencing and/or deletion duplication testing of the following 83 genes: ALK, APC, ATM, AXIN2,BAP1,  BARD1, BLM, BMPR1A, BRCA1, BRCA2, BRIP1, CASR, CDC73, CDH1, CDK4, CDKN1B, CDKN1C, CDKN2A (p14ARF), CDKN2A (p16INK4a), CEBPA, CHEK2, CTNNA1, DICER1, DIS3L2, EGFR (c.2369C>T, p.Thr790Met variant only), EPCAM (Deletion/duplication testing only), FH, FLCN, GATA2, GPC3, GREM1 (Promoter region deletion/duplication testing only), HOXB13 (c.251G>A, p.Gly84Glu), HRAS, KIT, MAX, MEN1, MET, MITF (c.952G>A, p.Glu318Lys variant only), MLH1, MSH2, MSH3, MSH6, MUTYH, NBN, NF1, NF2, NTHL1, PALB2, PDGFRA, PHOX2B, PMS2, POLD1, POLE, POT1, PRKAR1A, PTCH1, PTEN, RAD50, RAD51C, RAD51D, RB1, RECQL4, RET, RUNX1, SDHAF2, SDHA (sequence changes only), SDHB, SDHC, SDHD, SMAD4, SMARCA4, SMARCB1, SMARCE1, STK11, SUFU, TERT, TERT, TMEM127, TP53, TSC1, TSC2, VHL, WRN and WT1.    Based on Ms. Bow's personal and family history of  cancer, she meets medical criteria for genetic testing. Despite that she meets criteria, she may still have an out of pocket cost. We discussed that if her out of pocket cost for testing is over $100, the laboratory will call and confirm whether she wants to proceed with testing.  If the out of pocket cost of testing is less than $100 she will be billed by the genetic testing laboratory.  PLAN: After considering the risks, benefits, and limitations, Tracy Valentine  provided informed consent to pursue genetic testing and the blood sample was sent to [facility] for analysis of the Multi-cancer panel. Results should be available within approximately 2-3 weeks' time, at which point they will be disclosed by telephone to Tracy Valentine, as will any additional recommendations warranted by these results. Tracy Valentine will receive a summary of her genetic counseling visit and a copy of her results once available. This information will also be available in Epic. We encouraged Tracy Valentine to remain in contact with cancer genetics annually so that we can continuously update the family history and inform her of any changes in cancer genetics and testing that may be of benefit for her family. Tracy Valentine questions were answered to her satisfaction today. Our contact information was provided should additional questions or concerns arise.  Lastly, we encouraged Tracy Valentine to remain in contact with cancer genetics annually so that we can continuously update the family history and inform her of any changes in cancer genetics and testing that may be of benefit for this family.   Ms.  Valentine questions were answered to her satisfaction today. Our contact information was provided should additional questions or concerns arise. Thank you for the referral and allowing [place] to share in the care of your patient.   Elaysia Devargas P. [place], [place], [facility] Certified Genetic Counselor [email or web address] phone:  [phone]  The patient was seen for a total of 60 minutes in face-to-face genetic counseling.  This patient was discussed with Drs. Magrinat, Lindi Adie and/or Burr Medico who agrees with the above.    _______________________________________________________________________ For Office Staff:  Number of people involved in session: 1 Was an Intern/ student involved with case: no

## 2017-11-13 ENCOUNTER — Other Ambulatory Visit (HOSPITAL_COMMUNITY): Payer: Self-pay | Admitting: Emergency Medicine

## 2017-11-13 MED ORDER — ANASTROZOLE 1 MG PO TABS: 1.0000 mg | ORAL_TABLET | Freq: Every day | ORAL | 2 refills | Status: DC

## 2017-11-13 NOTE — Progress Notes (Signed)
Anastrozole refilled.

## 2017-11-16 ENCOUNTER — Encounter: Payer: Self-pay | Admitting: Genetic Counselor

## 2017-11-16 ENCOUNTER — Telehealth: Payer: Self-pay | Admitting: Genetic Counselor

## 2017-11-16 DIAGNOSIS — Z1379 Encounter for other screening for genetic and chromosomal anomalies: Secondary | ICD-10-CM | POA: Insufficient documentation

## 2017-11-16 NOTE — Telephone Encounter (Signed)
LM on VM with good news.  Asked that she CB. 

## 2017-11-19 DIAGNOSIS — G4733 Obstructive sleep apnea (adult) (pediatric): Secondary | ICD-10-CM | POA: Diagnosis not present

## 2017-11-19 NOTE — Telephone Encounter (Signed)
Revealed negative genetic testing.  Discussed that we do not know why she has breast cancer or why there is cancer in the family. It could be due to a different gene that we are not testing, or maybe our current technology may not be able to pick something up.  It will be important for her to keep in contact with genetics to keep up with whether additional testing may be needed. 

## 2017-11-20 ENCOUNTER — Ambulatory Visit: Payer: Self-pay | Admitting: Genetic Counselor

## 2017-11-20 DIAGNOSIS — R87610 Atypical squamous cells of undetermined significance on cytologic smear of cervix (ASC-US): Secondary | ICD-10-CM | POA: Diagnosis not present

## 2017-11-20 DIAGNOSIS — Z170 Estrogen receptor positive status [ER+]: Secondary | ICD-10-CM

## 2017-11-20 DIAGNOSIS — Z8041 Family history of malignant neoplasm of ovary: Secondary | ICD-10-CM

## 2017-11-20 DIAGNOSIS — Z8049 Family history of malignant neoplasm of other genital organs: Secondary | ICD-10-CM

## 2017-11-20 DIAGNOSIS — Z1379 Encounter for other screening for genetic and chromosomal anomalies: Secondary | ICD-10-CM

## 2017-11-20 DIAGNOSIS — C50412 Malignant neoplasm of upper-outer quadrant of left female breast: Secondary | ICD-10-CM

## 2017-11-20 NOTE — Progress Notes (Signed)
HPI: Ms. Bobby was previously seen in the [place] clinic due to a personal and family history of cancer and concerns regarding a hereditary predisposition to cancer. Please refer to our prior cancer genetics clinic note for more information regarding Ms. Limburg's medical, social and family histories, and our assessment and recommendations, at the time. Ms. Ingman recent genetic test results were disclosed to her, as were recommendations warranted by these results. These results and recommendations are discussed in more detail below.  CANCER HISTORY:    Breast cancer of upper-outer quadrant of left female breast ([place])   07/26/2016 Mammogram    Area of developing asymmetry with distortion located within the upper-outer quadrant left breast. Tissue sampling via tomosynthesis guided biopsy is recommended and will be scheduled.  RECOMMENDATION: Left breast tomosynthesis guided biopsy. This has been scheduled for 07/28/2016.       07/28/2016 Initial Biopsy    Coil shaped clip corresponds to the asymmetry in the outer left breast. Clip is well positioned at the biopsy site. 2. X shaped clip corresponds to the ASYMMETRY/DISTORTION in the upper-outer quadrant of the left breast. Clip is positioned approximately 1 cm medial to the center of the biopsy cavity      07/28/2016 Pathology Results    Estrogen Receptor: 80%, POSITIVE, MODERATE STAINING INTENSITY Progesterone Receptor: 0%, NEGATIVE Proliferation Marker Ki67: 70% HER 2 negative by FISH Breast, left, needle core biopsy, upper outer quadrant - INVASIVE DUCTAL CARCINOMA, SEE COMMENT. - HIGH GRADE DUCTAL CARCINOMA IN SITU WITH NECROSIS.      08/24/2016 Oncotype testing    Recurrence Score Result 30, 10 year risk of distant recurrence Tamoxifen alone 20%      10/03/2016 - 12/05/2016 Chemotherapy    The patient had palonosetron (ALOXI) injection 0.25 mg, 0.25 mg, Intravenous,  Once, 2 of 4  cycles Administration: 0.25 mg (10/24/2016)  pegfilgrastim (NEULASTA ONPRO KIT) injection 6 mg, 6 mg, Subcutaneous, Once, 2 of 4 cycles Administration: 6 mg (10/24/2016)  cyclophosphamide (CYTOXAN) 1,380 mg in sodium chloride 0.9 % 250 mL chemo infusion, 600 mg/m2 = 1,380 mg, Intravenous,  Once, 2 of 4 cycles Administration: 1,380 mg (10/24/2016)  DOCEtaxel (TAXOTERE) 170 mg in dextrose 5 % 250 mL chemo infusion, 75 mg/m2 = 170 mg, Intravenous,  Once, 2 of 4 cycles Dose modification: 65 mg/m2 (original dose 75 mg/m2, Cycle 2, Reason: Dose not tolerated)  fosaprepitant (EMEND) 150 mg, dexamethasone (DECADRON) 12 mg in sodium chloride 0.9 % 145 mL IVPB, , Intravenous,  Once, 1 of 3 cycles Administration:  (10/24/2016)  for chemotherapy treatment.         - 02/06/2017 Radiation Therapy    Completed adjuvant breast RT       02/26/2017 -  Anti-estrogen oral therapy    Started anastrozole      03/05/2017 Pathology Results    BCI-high risk of late recurrence (years 5-10) at 10.6% with a high likelihood of benefit from extended endocrine therapy.  High overall risk of recurrence (years 0-10) 19.5% and a high likelihood of benefit from extended endocrine therapy.      11/14/2017 Genetic Testing    Negative genetic testing on the Multi-cancer panel.  The Multi-Gene Panel offered by [facility] includes sequencing and/or deletion duplication testing of the following 83 genes: ALK, APC, ATM, AXIN2,BAP1,  BARD1, BLM, BMPR1A, BRCA1, BRCA2, BRIP1, CASR, CDC73, CDH1, CDK4, CDKN1B, CDKN1C, CDKN2A (p14ARF), CDKN2A (p16INK4a), CEBPA, CHEK2, CTNNA1, DICER1, DIS3L2, EGFR (c.2369C>T, p.Thr790Met variant only), EPCAM (Deletion/duplication testing only), FH, FLCN, GATA2,  GPC3, GREM1 (Promoter region deletion/duplication testing only), HOXB13 (c.251G>A, p.Gly84Glu), HRAS, KIT, MAX, MEN1, MET, MITF (c.952G>A, p.Glu318Lys variant only), MLH1, MSH2, MSH3, MSH6, MUTYH, NBN, NF1, NF2, NTHL1, PALB2, PDGFRA, PHOX2B, PMS2, POLD1,  POLE, POT1, PRKAR1A, PTCH1, PTEN, RAD50, RAD51C, RAD51D, RB1, RECQL4, RET, RUNX1, SDHAF2, SDHA (sequence changes only), SDHB, SDHC, SDHD, SMAD4, SMARCA4, SMARCB1, SMARCE1, STK11, SUFU, TERT, TERT, TMEM127, TP53, TSC1, TSC2, VHL, WRN and WT1. The report date is November 14, 2017.        FAMILY HISTORY:  We obtained a detailed, 4-generation family history.  Significant diagnoses are listed below: Family History  Problem Relation Age of Onset  . COPD Mother   . Ovarian cancer Mother 65  . Uterine cancer Mother 63  . Heart attack Father 42  . Diabetes Sister   . Heart disease Sister   . Kidney cancer Brother 67  . Breast cancer Cousin   . Kidney failure Sister   . Thyroid disease Sister   . Ovarian cancer Maternal Grandmother 72  . Cancer Paternal Aunt        NOS  . Cancer Paternal Uncle        NOS  . Colon cancer Other        2 distant cousins with colon cancer in their 5s    The patient has one son who is cancer free.  She has a full sister, and two paternal half siblings.  Her half brother had kidney cancer at 34.  Both parents are deceased.    The patient's mother had uterine cancer at 39 and possibly ovarian cancer as well.  She had one full brother and four maternal half siblings - two brother and two sisters.  None had cancer.  The maternal grandparents are deceased.  The grandfather died young from a gun shot wound, and the grandmother died of ovarian cancer.  The patient's father died of a heart attack at 65.  He had four sisters and three brothers.  One brother and one sister had an unknown form of cancer.  One sister had two grandchildren who had colon cancer in their 51's and died.  The paternal grandparents died from non cancer related issues.  Ms. Hanssen is unaware of previous family history of genetic testing for hereditary cancer risks. Patient's maternal ancestors are of Caucasian descent, and paternal ancestors are of Caucasian descent. There is no reported  Ashkenazi Jewish ancestry. There is no known consanguinity.  GENETIC TEST RESULTS: Genetic testing reported out on November 19, 2017 through the Multi-cancer panel found no deleterious mutations.  The Multi-Gene Panel offered by [facility] includes sequencing and/or deletion duplication testing of the following 83 genes: ALK, APC, ATM, AXIN2,BAP1,  BARD1, BLM, BMPR1A, BRCA1, BRCA2, BRIP1, CASR, CDC73, CDH1, CDK4, CDKN1B, CDKN1C, CDKN2A (p14ARF), CDKN2A (p16INK4a), CEBPA, CHEK2, CTNNA1, DICER1, DIS3L2, EGFR (c.2369C>T, p.Thr790Met variant only), EPCAM (Deletion/duplication testing only), FH, FLCN, GATA2, GPC3, GREM1 (Promoter region deletion/duplication testing only), HOXB13 (c.251G>A, p.Gly84Glu), HRAS, KIT, MAX, MEN1, MET, MITF (c.952G>A, p.Glu318Lys variant only), MLH1, MSH2, MSH3, MSH6, MUTYH, NBN, NF1, NF2, NTHL1, PALB2, PDGFRA, PHOX2B, PMS2, POLD1, POLE, POT1, PRKAR1A, PTCH1, PTEN, RAD50, RAD51C, RAD51D, RB1, RECQL4, RET, RUNX1, SDHAF2, SDHA (sequence changes only), SDHB, SDHC, SDHD, SMAD4, SMARCA4, SMARCB1, SMARCE1, STK11, SUFU, TERT, TERT, TMEM127, TP53, TSC1, TSC2, VHL, WRN and WT1.  The test report has been scanned into EPIC and is located under the Molecular Pathology section of the Results Review tab.   We discussed with Ms. Ebel that since the current genetic testing is  not perfect, it is possible there may be a gene mutation in one of these genes that current testing cannot detect, but that chance is small. We also discussed, that it is possible that another gene that has not yet been discovered, or that we have not yet tested, is responsible for the cancer diagnoses in the family, and it is, therefore, important to remain in touch with cancer genetics in the future so that we can continue to offer Ms. Philyaw the most up to date genetic testing.    CANCER SCREENING RECOMMENDATIONS:This result is reassuring and indicates that Ms. Lingerfelt likely does not have an increased risk for a future  cancer due to a mutation in one of these genes. This normal test also suggests that Ms. Ewart's cancer was most likely not due to an inherited predisposition associated with one of these genes.  Most cancers happen by chance and this negative test suggests that her cancer falls into this category.  We, therefore, recommended she continue to follow the cancer management and screening guidelines provided by her oncology and primary healthcare provider.   RECOMMENDATIONS FOR FAMILY MEMBERS: Women in this family might be at some increased risk of developing cancer, over the general population risk, simply due to the family history of cancer. We recommended women in this family have a yearly mammogram beginning at age 18, or 44 years younger than the earliest onset of cancer, an annual clinical breast exam, and perform monthly breast self-exams. Women in this family should also have a gynecological exam as recommended by their primary provider. All family members should have a colonoscopy by age 54.  FOLLOW-UP: Lastly, we discussed with Ms. Brickner that cancer genetics is a rapidly advancing field and it is possible that new genetic tests will be appropriate for her and/or her family members in the future. We encouraged her to remain in contact with cancer genetics on an annual basis so we can update her personal and family histories and let her know of advances in cancer genetics that may benefit this family.   Our contact number was provided. Ms. Mcgraw questions were answered to her satisfaction, and she knows she is welcome to call us at anytime with additional questions or concerns.   Roma Kayser, MS, [facility] Certified Genetic Counselor [email or web address]

## 2017-11-30 DIAGNOSIS — G4733 Obstructive sleep apnea (adult) (pediatric): Secondary | ICD-10-CM | POA: Diagnosis not present

## 2017-12-04 ENCOUNTER — Other Ambulatory Visit (HOSPITAL_COMMUNITY): Payer: Medicare Other

## 2017-12-05 ENCOUNTER — Inpatient Hospital Stay (HOSPITAL_COMMUNITY): Payer: Medicare Other | Attending: Genetic Counselor

## 2017-12-05 ENCOUNTER — Encounter (HOSPITAL_COMMUNITY): Payer: Self-pay

## 2017-12-05 DIAGNOSIS — C50412 Malignant neoplasm of upper-outer quadrant of left female breast: Secondary | ICD-10-CM | POA: Insufficient documentation

## 2017-12-05 DIAGNOSIS — Z452 Encounter for adjustment and management of vascular access device: Secondary | ICD-10-CM | POA: Diagnosis not present

## 2017-12-05 MED ORDER — SODIUM CHLORIDE 0.9% FLUSH
10.0000 mL | INTRAVENOUS | Status: DC | PRN
  Administered 2017-12-05: 10 mL via INTRAVENOUS
  Filled 2017-12-05: qty 10

## 2017-12-05 MED ORDER — HEPARIN SOD (PORK) LOCK FLUSH 100 UNIT/ML IV SOLN
500.0000 [IU] | Freq: Once | INTRAVENOUS | Status: AC
  Administered 2017-12-05: 500 [IU] via INTRAVENOUS

## 2017-12-05 NOTE — Progress Notes (Signed)
Tracy Valentine presented for Portacath access and flush. Portacath located right chest wall accessed with  H 20 needle. No blood return and flushed without any resistance met. Portacath flushed with 8m NS and 500U/54mHeparin and needle removed intact. Procedure without incident. Patient tolerated procedure well.  Treatment given per orders. Patient tolerated it well without problems. Vitals stable and discharged home from clinic ambulatory. Follow up as scheduled.

## 2017-12-05 NOTE — Patient Instructions (Signed)
[facility] at [facility] Discharge Instructions  RECOMMENDATIONS MADE BY THE CONSULTANT AND ANY TEST RESULTS WILL BE SENT TO YOUR REFERRING PHYSICIAN.  Port flush done today. Follow up as scheduled.  Thank you for choosing [facility] at [facility] to provide your oncology and hematology care.  To afford each patient quality time with our provider, please arrive at least 15 minutes before your scheduled appointment time.    If you have a lab appointment with the Cancer Center please come in thru the  Main Entrance and check in at the main information desk  You need to re-schedule your appointment should you arrive 10 or more minutes late.  We strive to give you quality time with our providers, and arriving late affects you and other patients whose appointments are after yours.  Also, if you no show three or more times for appointments you may be dismissed from the clinic at the providers discretion.     Again, thank you for choosing [facility].  Our hope is that these requests will decrease the amount of time that you wait before being seen by our physicians.       _____________________________________________________________  Should you have questions after your visit to  Cancer Center, please contact our office at [phone] between the hours of 8:30 a.m. and 4:30 p.m.  Voicemails left after 4:30 p.m. will not be returned until the following business day.  For prescription refill requests, have your pharmacy contact our office.       Resources For Cancer Patients and their Caregivers ? American Cancer Society: Can assist with transportation, wigs, general needs, runs Look Good Feel Better.        1-888-227-6333 ? Cancer Care: Provides financial assistance, online support groups, medication/co-pay assistance.  1-800-813-HOPE (4673) ? [facility] Assists [place] cancer patients and  their families through emotional , educational and financial support.  336-427-4357 ? [place] DSS Where to apply for food stamps, Medicaid and utility assistance. 336-342-1394 ? [facility]: Transportation to medical appointments. 336-347-2287 ? Social Security Administration: May apply for disability if have a Stage IV cancer. 336-342-7796 1-800-772-1213 ? [place] Aging, Disability and Transit Services: Assists with nutrition, care and transit needs. 336-349-2343  Cancer Center Support Programs: [email or web address]@ > Cancer Support Group  2nd Tuesday of the month 1pm-2pm, Journey Room  > Creative Journey  3rd Tuesday of the month 1130am-1pm, Journey Room  > Look Good Feel Better  1st Wednesday of the month 10am-12 noon, Journey Room (Call American Cancer Society to register 1-800-395-5775)   

## 2017-12-19 ENCOUNTER — Telehealth (HOSPITAL_COMMUNITY): Payer: Self-pay

## 2017-12-19 DIAGNOSIS — G894 Chronic pain syndrome: Secondary | ICD-10-CM | POA: Diagnosis not present

## 2017-12-19 DIAGNOSIS — M47812 Spondylosis without myelopathy or radiculopathy, cervical region: Secondary | ICD-10-CM | POA: Diagnosis not present

## 2017-12-19 DIAGNOSIS — M5416 Radiculopathy, lumbar region: Secondary | ICD-10-CM | POA: Diagnosis not present

## 2017-12-19 DIAGNOSIS — M47816 Spondylosis without myelopathy or radiculopathy, lumbar region: Secondary | ICD-10-CM | POA: Diagnosis not present

## 2017-12-19 NOTE — Telephone Encounter (Signed)
Patient called concerned that her hair is falling out. She states it is just coming out on the top of her head. She is having to style her hair differently because of this. She wants to know if this hair loss has anything to do with previous chemo treatments or the Arimidex she is on. She also has hypothyroidism and I asked if she had labs checked for that recently and she said she had and was told they were good.

## 2017-12-19 NOTE — Telephone Encounter (Signed)
Yes, Arimidex can cause hair thinning.    gwd

## 2017-12-21 ENCOUNTER — Other Ambulatory Visit (HOSPITAL_COMMUNITY): Payer: Self-pay | Admitting: Adult Health

## 2017-12-21 DIAGNOSIS — Z170 Estrogen receptor positive status [ER+]: Principal | ICD-10-CM

## 2017-12-21 DIAGNOSIS — C50412 Malignant neoplasm of upper-outer quadrant of left female breast: Secondary | ICD-10-CM

## 2017-12-21 MED ORDER — LETROZOLE 2.5 MG PO TABS
2.5000 mg | ORAL_TABLET | Freq: Every day | ORAL | 0 refills | Status: DC
Start: 2017-12-21 — End: 2018-03-07

## 2017-12-21 NOTE — Telephone Encounter (Signed)
Patients states her hair loss is"getting pretty bad." She wants to know if she can change the Arimidex to another med that does not cause hair thinning.

## 2017-12-21 NOTE — Telephone Encounter (Signed)
Rx for Femara sent to her pharmacy.   gwd

## 2017-12-21 NOTE — Telephone Encounter (Signed)
All of the aromatase inhibitors share the same side effect profile, so the risk for hair thinning with another medication is certainly possible, but we can consider changing her medication.  We could try letrozole (Femara) if she would like.  Just let me know and I can send this prescription in to her pharmacy.  She would stop the Arimidex and start the Femara the following day if she decides she wants to change medications.   Mike Craze, NP [place] [phone]

## 2017-12-21 NOTE — Telephone Encounter (Signed)
Patient wants to try the letrozole. She uses Tracy Valentine. I explained to patient she may not see a difference for 6-8 weeks because hair grows in cycles.

## 2018-01-20 DIAGNOSIS — G4733 Obstructive sleep apnea (adult) (pediatric): Secondary | ICD-10-CM | POA: Diagnosis not present

## 2018-01-21 DIAGNOSIS — G4733 Obstructive sleep apnea (adult) (pediatric): Secondary | ICD-10-CM | POA: Diagnosis not present

## 2018-02-01 ENCOUNTER — Other Ambulatory Visit: Payer: Self-pay

## 2018-02-01 ENCOUNTER — Encounter (HOSPITAL_COMMUNITY): Payer: Self-pay

## 2018-02-01 ENCOUNTER — Inpatient Hospital Stay (HOSPITAL_COMMUNITY): Payer: Medicare Other | Attending: Genetic Counselor

## 2018-02-01 DIAGNOSIS — J302 Other seasonal allergic rhinitis: Secondary | ICD-10-CM | POA: Diagnosis not present

## 2018-02-01 DIAGNOSIS — Z452 Encounter for adjustment and management of vascular access device: Secondary | ICD-10-CM | POA: Diagnosis not present

## 2018-02-01 DIAGNOSIS — C50412 Malignant neoplasm of upper-outer quadrant of left female breast: Secondary | ICD-10-CM | POA: Insufficient documentation

## 2018-02-01 DIAGNOSIS — Z1389 Encounter for screening for other disorder: Secondary | ICD-10-CM | POA: Diagnosis not present

## 2018-02-01 DIAGNOSIS — Z6841 Body Mass Index (BMI) 40.0 and over, adult: Secondary | ICD-10-CM | POA: Diagnosis not present

## 2018-02-01 MED ORDER — HEPARIN SOD (PORK) LOCK FLUSH 100 UNIT/ML IV SOLN
500.0000 [IU] | Freq: Once | INTRAVENOUS | Status: AC
  Administered 2018-02-01: 500 [IU] via INTRAVENOUS

## 2018-02-01 MED ORDER — SODIUM CHLORIDE 0.9% FLUSH
10.0000 mL | INTRAVENOUS | Status: DC | PRN
  Administered 2018-02-01: 10 mL via INTRAVENOUS
  Filled 2018-02-01: qty 10

## 2018-02-01 NOTE — Progress Notes (Signed)
Tracy Valentine presented for Portacath access and flush.  Portacath located right chest wall accessed with  H 20 needle.  No blood return and flushes w/o difficulty; no s/s infiltration at site. Portacath flushed with 43ml NS and 500U/43ml Heparin and needle removed intact.  Procedure tolerated well and without incident.  Discharged ambulatory.

## 2018-02-14 DIAGNOSIS — M47816 Spondylosis without myelopathy or radiculopathy, lumbar region: Secondary | ICD-10-CM | POA: Diagnosis not present

## 2018-02-14 DIAGNOSIS — M5416 Radiculopathy, lumbar region: Secondary | ICD-10-CM | POA: Diagnosis not present

## 2018-02-14 DIAGNOSIS — M47812 Spondylosis without myelopathy or radiculopathy, cervical region: Secondary | ICD-10-CM | POA: Diagnosis not present

## 2018-02-14 DIAGNOSIS — G894 Chronic pain syndrome: Secondary | ICD-10-CM | POA: Diagnosis not present

## 2018-02-16 IMAGING — MG BREAST SURGICAL SPECIMEN
1 series · 1 of 1 positions shown · non-contrast
Comparison: Previous exam(s).

CLINICAL DATA: Evaluate specimen

EXAM:
SPECIMEN RADIOGRAPH OF THE LEFT BREAST

[L]
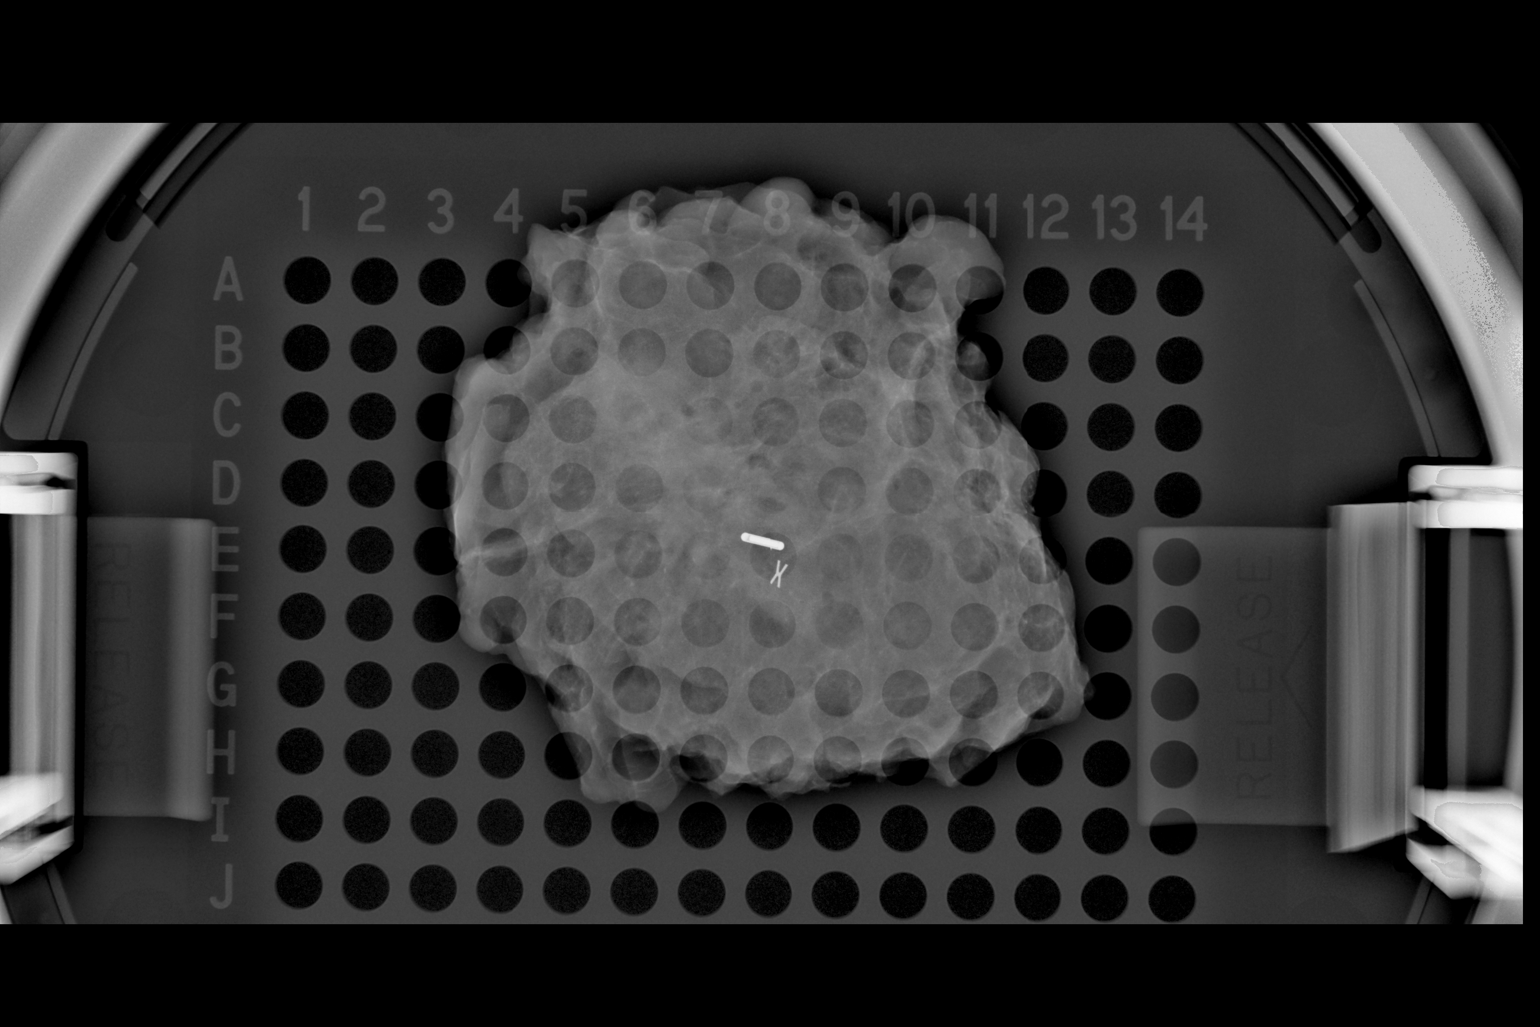

[1 of 1 positions shown; findings below may reference images not displayed]

FINDINGS: Status post excision of the left breast. The radioactive seed and
biopsy marker clip are present, completely intact, and were marked
for pathology.
IMPRESSION: Specimen radiograph of the left breast.

## 2018-02-18 ENCOUNTER — Other Ambulatory Visit (HOSPITAL_COMMUNITY): Payer: Self-pay

## 2018-02-18 DIAGNOSIS — C50412 Malignant neoplasm of upper-outer quadrant of left female breast: Secondary | ICD-10-CM

## 2018-02-18 DIAGNOSIS — Z170 Estrogen receptor positive status [ER+]: Principal | ICD-10-CM

## 2018-02-18 MED ORDER — ONDANSETRON HCL 8 MG PO TABS: 8.0000 mg | ORAL_TABLET | Freq: Three times a day (TID) | ORAL | 2 refills | Status: DC | PRN

## 2018-02-20 DIAGNOSIS — G4733 Obstructive sleep apnea (adult) (pediatric): Secondary | ICD-10-CM | POA: Diagnosis not present

## 2018-02-21 ENCOUNTER — Encounter (HOSPITAL_COMMUNITY): Payer: Self-pay | Admitting: Adult Health

## 2018-02-21 DIAGNOSIS — Z170 Estrogen receptor positive status [ER+]: Secondary | ICD-10-CM | POA: Diagnosis not present

## 2018-02-21 DIAGNOSIS — C50412 Malignant neoplasm of upper-outer quadrant of left female breast: Secondary | ICD-10-CM | POA: Diagnosis not present

## 2018-02-21 NOTE — Progress Notes (Signed)
Patient would like to have her port-a-cath removed.  She has history of breast cancer.    Chart reviewed. Past few port flushes by nursing has noted that the port flushes without difficulty, but no blood return noted.  She completed chemotherapy on 12/05/16.  Mammogram on 07/23/17 without evidence of malignancy in either breast.  Last office visit at cancer center on 10/04/17 with Dr. Talbert Cage; she was noted to clinically be without evidence of disease at that time.   BCI testing dated 08/24/16 demonstrated HIGH risk of recurrence and HIGH likelihood of benefit from extension of anti-estrogen therapy beyond 5 years.  Therefore, she should continue Letrozole for 10 years.   From an oncologic standpoint, it is fine for patient to have her port removed per her wishes.  She will see Dr. Arnoldo Morale or Dr. Constance Haw locally for port removal.  We will help make that referral.      Mike Craze, NP [place] [phone]

## 2018-02-28 ENCOUNTER — Ambulatory Visit: Payer: Medicare Other | Admitting: General Surgery

## 2018-02-28 ENCOUNTER — Encounter: Payer: Self-pay | Admitting: General Surgery

## 2018-02-28 VITALS — BP 156/78 | HR 61 | Temp 98.8°F | Resp 18 | Wt 238.0 lb

## 2018-02-28 DIAGNOSIS — C50412 Malignant neoplasm of upper-outer quadrant of left female breast: Secondary | ICD-10-CM

## 2018-02-28 DIAGNOSIS — Z170 Estrogen receptor positive status [ER+]: Secondary | ICD-10-CM

## 2018-02-28 NOTE — H&P (Signed)
Tracy Valentine; [id number]; [birth year]   HPI Patient is a 62 year old white female with history of left breast carcinoma who has finished with her chemotherapy and was referred by Dr. Delton Coombes for removal of her Port-A-Cath.  She denies any pain.  The Port-A-Cath was placed by myself in 2017. Past Medical History:  Diagnosis Date  . Anxiety   . Breast cancer (HCC)    breast  . Chronic pain   . DDD (degenerative disc disease), cervical   . DDD (degenerative disc disease), lumbar   . Degenerative disc disease at L5-S1 level   . Depression   . Family history of ovarian cancer   . Family history of uterine cancer   . GERD (gastroesophageal reflux disease)   . History of kidney stones   . Hyperlipidemia   . Hypothyroidism   . Obesity   . Osteoarthritis   . Personal history of chemotherapy   . PONV (postoperative nausea and vomiting)   . Sleep apnea    uses CPAP  . Spinal stenosis   . Spinal stenosis     Past Surgical History:  Procedure Laterality Date  . ABDOMINAL HYSTERECTOMY     partial hyst, still have ovaries  . BREAST LUMPECTOMY Left 08/24/2016  . BREAST LUMPECTOMY WITH RADIOACTIVE SEED AND SENTINEL LYMPH NODE BIOPSY Left 08/24/2016   Procedure: LEFT BREAST PARTIAL MASTECTOMY WITH RADIOACTIVE SEED AND LEFT SENTINEL LYMPH NODE MAPPING;  Surgeon: Erroll Luna, MD;  Location: [facility];  Service: General;  Laterality: Left;  . CHOLECYSTECTOMY    . FOOT FOREIGN BODY REMOVAL Left   . PORTACATH PLACEMENT Right 09/29/2016   Procedure: INSERTION PORT-A-CATH RIGHT SUBCLAVIAN;  Surgeon: Aviva Signs, MD;  Location: [facility];  Service: General;  Laterality: Right;    Family History  Problem Relation Age of Onset  . COPD Mother   . Ovarian cancer Mother 59  . Uterine cancer Mother 59  . Heart attack Father 20  . Diabetes Sister   . Heart disease Sister   . Kidney cancer Brother 46  . Breast cancer Cousin   . Kidney failure Sister   . Thyroid disease Sister    . Ovarian cancer Maternal Grandmother 18  . Cancer Paternal Aunt        NOS  . Cancer Paternal Uncle        NOS  . Colon cancer Other        2 distant cousins with colon cancer in their 45s    Current Outpatient Medications on File Prior to Visit  Medication Sig Dispense Refill  . ALPRAZolam (XANAX) 1 MG tablet Take 1 mg by mouth 4 (four) times daily as needed for sleep.    [name] Kitchen aspirin 325 MG tablet Take 325 mg by mouth daily.    . calcium carbonate (TUMS - DOSED IN MG ELEMENTAL CALCIUM) 500 MG chewable tablet Chew 2 tablets by mouth 2 (two) times daily.    [name] Kitchen docusate sodium (COLACE) 50 MG capsule Take 100 mg by mouth 2 (two) times daily.    . fluticasone (FLONASE) 50 MCG/ACT nasal spray Place 1 spray into both nostrils daily.    [name] Kitchen ipratropium (ATROVENT) 0.03 % nasal spray 1 spray daily.  4  . KRILL OIL PO Take 1 capsule by mouth daily.    [name] Kitchen letrozole (FEMARA) 2.5 MG tablet Take 1 tablet (2.5 mg total) by mouth daily. 90 tablet 0  . levothyroxine (SYNTHROID, LEVOTHROID) 100 MCG tablet Take 100 mcg by mouth daily.  5  .  lidocaine-prilocaine (EMLA) cream Apply a quarter size amount to port site 1 hour prior to chemo. Do not rub in. Cover with plastic wrap. 30 g 3  . montelukast (SINGULAIR) 10 MG tablet Take 10 mg by mouth daily.  11  . ondansetron (ZOFRAN) 8 MG tablet Take 1 tablet (8 mg total) by mouth every 8 (eight) hours as needed for nausea or vomiting. 30 tablet 2  . Oxycodone HCl 10 MG TABS   0  . prochlorperazine (COMPAZINE) 10 MG tablet Take 1 tablet (10 mg total) by mouth every 6 (six) hours as needed for nausea or vomiting. (Patient not taking: Reported on 12/05/2017) 30 tablet 2  . sertraline (ZOLOFT) 100 MG tablet Take 100 mg by mouth daily.  0  . simvastatin (ZOCOR) 20 MG tablet Take 20 mg by mouth every evening.    . traZODone (DESYREL) 50 MG tablet TAKE 1 OR 2 TABLETS BY MOUTH AT BEDTIME  3  . [DISCONTINUED] calcium-vitamin D (OSCAL WITH D) 500-200 MG-UNIT tablet Take 1  tablet by mouth 2 (two) times daily. 60 tablet 10   Current Facility-Administered Medications on File Prior to Visit  Medication Dose Route Frequency Provider Last Rate Last Dose  . sodium chloride flush (NS) 0.9 % injection 10 mL  10 mL Intravenous PRN Holley Bouche, NP   10 mL at 10/04/17 1111    Allergies  Allergen Reactions  . Adhesive [Tape]     Some adhesives cause rash, redness, blister (not all)  . Amoxicillin Hives  . Darvocet [Propoxyphene N-Acetaminophen]   . Doxycycline Hives  . Erythromycin Nausea And Vomiting    Severe GI issues  . Shellfish Allergy Nausea And Vomiting  . Zithromax [Azithromycin] Other (See Comments)    Vomiting, diarrhea  . Latex Rash    Social History   Substance and Sexual Activity  Alcohol Use No    Social History   Tobacco Use  Smoking Status Never Smoker  Smokeless Tobacco Never Used    Review of Systems  Constitutional: Positive for malaise/fatigue.  HENT: Positive for sinus pain.   Eyes: Negative.   Respiratory: Negative.   Cardiovascular: Negative.   Gastrointestinal: Positive for abdominal pain.  Genitourinary: Negative.   Musculoskeletal: Positive for back pain, joint pain and neck pain.  Skin: Negative.   Neurological: Negative.   Endo/Heme/Allergies: Negative.   Psychiatric/Behavioral: Negative.     Objective   Vitals:   02/28/18 0947  BP: (!) 156/78  Pulse: 61  Resp: 18  Temp: 98.8 F (37.1 C)    Physical Exam  Constitutional: She is oriented to person, place, and time. She appears well-developed and well-nourished.  HENT:  Head: Normocephalic and atraumatic.  Cardiovascular: Normal rate, regular rhythm and normal heart sounds. Exam reveals no gallop and no friction rub.  No murmur heard. Pulmonary/Chest: Effort normal and breath sounds normal. No stridor. No respiratory distress. She has no wheezes. She has no rales.  Port-A-Cath in place right upper chest.  Neurological: She is alert and oriented  to person, place, and time.  Skin: Skin is warm and dry.  Vitals reviewed.  Oncology notes reviewed Assessment  Left breast carcinoma, finished with chemotherapy Plan   Patient is scheduled for Port-A-Cath removal in the minor procedure room on 03/06/2018.  The risks and benefits of the procedure including bleeding and infection were fully explained to the patient, who gave informed consent.

## 2018-02-28 NOTE — Patient Instructions (Signed)
Implanted Port Removal Implanted port removal is a procedure to remove the port and catheter (port-a-cath) that is implanted under your skin. The port is a small disc under your skin that can be punctured with a needle. It is connected to a vein in your chest or neck by a small flexible tube (catheter). The port-a-cath is used for treatment through an IV tube and for taking blood samples. Your health care provider will remove the port-a-cath if:  You no longer need it for treatment.  It is not working properly.  The area around it gets infected.  Tell a health care provider about:  Any allergies you have.  All medicines you are taking, including vitamins, herbs, eye drops, creams, and over-the-counter medicines.  Any problems you or family members have had with anesthetic medicines.  Any blood disorders you have.  Any surgeries you have had.  Any medical conditions you have.  Whether you are pregnant or may be pregnant. What are the risks? Generally, this is a safe procedure. However, problems may occur, including:  Infection.  Bleeding.  Allergic reactions to anesthetic medicines.  Damage to nerves or blood vessels.  What happens before the procedure?  You will have: ? A physical exam. ? Blood tests. ? Imaging tests, including a chest X-ray.  Follow instructions from your health care provider about eating or drinking restrictions.  Ask your health care provider about: ? Changing or stopping your regular medicines. This is especially important if you are taking diabetes medicines or blood thinners. ? Taking medicines such as aspirin and ibuprofen. These medicines can thin your blood. Do not take these medicines before your procedure if your surgeon instructs you not to.  Ask your health care provider how your surgical site will be marked or identified.  You may be given antibiotic medicine to help prevent infection.  Plan to have someone take you home after the  procedure.  If you will be going home right after the procedure, plan to have someone stay with you for 24 hours. What happens during the procedure?  To reduce your risk of infection: ? Your health care team will wash or sanitize their hands. ? Your skin will be washed with soap.  You may be given one or more of the following: ? A medicine to help you relax (sedative). ? A medicine to numb the area (local anesthetic).  A small cut (incision) will be made at the site of your port-a-cath.  The port-a-cath and the catheter that has been inside your vein will gently be removed.  The incision will be closed with stitches (sutures), adhesive strips, or skin glue.  A bandage (dressing) will be placed over the incision. The procedure may vary among health care providers and hospitals. What happens after the procedure?  Your blood pressure, heart rate, breathing rate, and blood oxygen level will be monitored often until the medicines you were given have worn off.  Do not drive for 24 hours if you received a sedative. This information is not intended to replace advice given to you by your health care provider. Make sure you discuss any questions you have with your health care provider. Document Released: 09/20/2015 Document Revised: 03/16/2016 Document Reviewed: 07/14/2015 Elsevier Interactive Patient Education  2018 Elsevier Inc.  

## 2018-02-28 NOTE — Progress Notes (Signed)
Tracy Valentine; [id number]; 1956/05/16   HPI Patient is a 62 year old white female with history of left breast carcinoma who has finished with her chemotherapy and was referred by Dr. Delton Coombes for removal of her Port-A-Cath.  She denies any pain.  The Port-A-Cath was placed by myself in 2017. Past Medical History:  Diagnosis Date  . Anxiety   . Breast cancer (HCC)    breast  . Chronic pain   . DDD (degenerative disc disease), cervical   . DDD (degenerative disc disease), lumbar   . Degenerative disc disease at L5-S1 level   . Depression   . Family history of ovarian cancer   . Family history of uterine cancer   . GERD (gastroesophageal reflux disease)   . History of kidney stones   . Hyperlipidemia   . Hypothyroidism   . Obesity   . Osteoarthritis   . Personal history of chemotherapy   . PONV (postoperative nausea and vomiting)   . Sleep apnea    uses CPAP  . Spinal stenosis   . Spinal stenosis     Past Surgical History:  Procedure Laterality Date  . ABDOMINAL HYSTERECTOMY     partial hyst, still have ovaries  . BREAST LUMPECTOMY Left 08/24/2016  . BREAST LUMPECTOMY WITH RADIOACTIVE SEED AND SENTINEL LYMPH NODE BIOPSY Left 08/24/2016   Procedure: LEFT BREAST PARTIAL MASTECTOMY WITH RADIOACTIVE SEED AND LEFT SENTINEL LYMPH NODE MAPPING;  Surgeon: Erroll Luna, MD;  Location: [facility];  Service: General;  Laterality: Left;  . CHOLECYSTECTOMY    . FOOT FOREIGN BODY REMOVAL Left   . PORTACATH PLACEMENT Right 09/29/2016   Procedure: INSERTION PORT-A-CATH RIGHT SUBCLAVIAN;  Surgeon: Aviva Signs, MD;  Location: [facility];  Service: General;  Laterality: Right;    Family History  Problem Relation Age of Onset  . COPD Mother   . Ovarian cancer Mother 63  . Uterine cancer Mother 78  . Heart attack Father 58  . Diabetes Sister   . Heart disease Sister   . Kidney cancer Brother 55  . Breast cancer Cousin   . Kidney failure Sister   . Thyroid disease Sister    . Ovarian cancer Maternal Grandmother 18  . Cancer Paternal Aunt        NOS  . Cancer Paternal Uncle        NOS  . Colon cancer Other        2 distant cousins with colon cancer in their 71s    Current Outpatient Medications on File Prior to Visit  Medication Sig Dispense Refill  . ALPRAZolam (XANAX) 1 MG tablet Take 1 mg by mouth 4 (four) times daily as needed for sleep.    [name] Kitchen aspirin 325 MG tablet Take 325 mg by mouth daily.    . calcium carbonate (TUMS - DOSED IN MG ELEMENTAL CALCIUM) 500 MG chewable tablet Chew 2 tablets by mouth 2 (two) times daily.    [name] Kitchen docusate sodium (COLACE) 50 MG capsule Take 100 mg by mouth 2 (two) times daily.    . fluticasone (FLONASE) 50 MCG/ACT nasal spray Place 1 spray into both nostrils daily.    [name] Kitchen ipratropium (ATROVENT) 0.03 % nasal spray 1 spray daily.  4  . KRILL OIL PO Take 1 capsule by mouth daily.    [name] Kitchen letrozole (FEMARA) 2.5 MG tablet Take 1 tablet (2.5 mg total) by mouth daily. 90 tablet 0  . levothyroxine (SYNTHROID, LEVOTHROID) 100 MCG tablet Take 100 mcg by mouth daily.  5  .  lidocaine-prilocaine (EMLA) cream Apply a quarter size amount to port site 1 hour prior to chemo. Do not rub in. Cover with plastic wrap. 30 g 3  . montelukast (SINGULAIR) 10 MG tablet Take 10 mg by mouth daily.  11  . ondansetron (ZOFRAN) 8 MG tablet Take 1 tablet (8 mg total) by mouth every 8 (eight) hours as needed for nausea or vomiting. 30 tablet 2  . Oxycodone HCl 10 MG TABS   0  . prochlorperazine (COMPAZINE) 10 MG tablet Take 1 tablet (10 mg total) by mouth every 6 (six) hours as needed for nausea or vomiting. (Patient not taking: Reported on 12/05/2017) 30 tablet 2  . sertraline (ZOLOFT) 100 MG tablet Take 100 mg by mouth daily.  0  . simvastatin (ZOCOR) 20 MG tablet Take 20 mg by mouth every evening.    . traZODone (DESYREL) 50 MG tablet TAKE 1 OR 2 TABLETS BY MOUTH AT BEDTIME  3  . [DISCONTINUED] calcium-vitamin D (OSCAL WITH D) 500-200 MG-UNIT tablet Take 1  tablet by mouth 2 (two) times daily. 60 tablet 10   Current Facility-Administered Medications on File Prior to Visit  Medication Dose Route Frequency Provider Last Rate Last Dose  . sodium chloride flush (NS) 0.9 % injection 10 mL  10 mL Intravenous PRN Holley Bouche, NP   10 mL at 10/04/17 1111    Allergies  Allergen Reactions  . Adhesive [Tape]     Some adhesives cause rash, redness, blister (not all)  . Amoxicillin Hives  . Darvocet [Propoxyphene N-Acetaminophen]   . Doxycycline Hives  . Erythromycin Nausea And Vomiting    Severe GI issues  . Shellfish Allergy Nausea And Vomiting  . Zithromax [Azithromycin] Other (See Comments)    Vomiting, diarrhea  . Latex Rash    Social History   Substance and Sexual Activity  Alcohol Use No    Social History   Tobacco Use  Smoking Status Never Smoker  Smokeless Tobacco Never Used    Review of Systems  Constitutional: Positive for malaise/fatigue.  HENT: Positive for sinus pain.   Eyes: Negative.   Respiratory: Negative.   Cardiovascular: Negative.   Gastrointestinal: Positive for abdominal pain.  Genitourinary: Negative.   Musculoskeletal: Positive for back pain, joint pain and neck pain.  Skin: Negative.   Neurological: Negative.   Endo/Heme/Allergies: Negative.   Psychiatric/Behavioral: Negative.     Objective   Vitals:   02/28/18 0947  BP: (!) 156/78  Pulse: 61  Resp: 18  Temp: 98.8 F (37.1 C)    Physical Exam  Constitutional: She is oriented to person, place, and time. She appears well-developed and well-nourished.  HENT:  Head: Normocephalic and atraumatic.  Cardiovascular: Normal rate, regular rhythm and normal heart sounds. Exam reveals no gallop and no friction rub.  No murmur heard. Pulmonary/Chest: Effort normal and breath sounds normal. No stridor. No respiratory distress. She has no wheezes. She has no rales.  Port-A-Cath in place right upper chest.  Neurological: She is alert and oriented  to person, place, and time.  Skin: Skin is warm and dry.  Vitals reviewed.  Oncology notes reviewed Assessment  Left breast carcinoma, finished with chemotherapy Plan   Patient is scheduled for Port-A-Cath removal in the minor procedure room on 03/06/2018.  The risks and benefits of the procedure including bleeding and infection were fully explained to the patient, who gave informed consent.

## 2018-03-05 ENCOUNTER — Other Ambulatory Visit (HOSPITAL_COMMUNITY): Payer: Self-pay | Admitting: Hematology

## 2018-03-05 DIAGNOSIS — C50412 Malignant neoplasm of upper-outer quadrant of left female breast: Secondary | ICD-10-CM

## 2018-03-05 DIAGNOSIS — Z170 Estrogen receptor positive status [ER+]: Principal | ICD-10-CM

## 2018-03-06 ENCOUNTER — Encounter (HOSPITAL_COMMUNITY)
Admission: RE | Disposition: A | Discharge status: Home / Self Care | Payer: Self-pay | Source: Ambulatory Visit | Attending: General Surgery

## 2018-03-06 ENCOUNTER — Ambulatory Visit (HOSPITAL_COMMUNITY)
Admission: RE | Admit: 2018-03-06 | Discharge: 2018-03-06 | Disposition: A | Discharge status: Home / Self Care | Payer: Medicare Other | Source: Ambulatory Visit | Attending: General Surgery | Admitting: General Surgery

## 2018-03-06 DIAGNOSIS — Z79890 Hormone replacement therapy (postmenopausal): Secondary | ICD-10-CM | POA: Diagnosis not present

## 2018-03-06 DIAGNOSIS — C50412 Malignant neoplasm of upper-outer quadrant of left female breast: Secondary | ICD-10-CM | POA: Diagnosis not present

## 2018-03-06 DIAGNOSIS — Z7982 Long term (current) use of aspirin: Secondary | ICD-10-CM | POA: Insufficient documentation

## 2018-03-06 DIAGNOSIS — Z452 Encounter for adjustment and management of vascular access device: Secondary | ICD-10-CM | POA: Insufficient documentation

## 2018-03-06 DIAGNOSIS — Z9221 Personal history of antineoplastic chemotherapy: Secondary | ICD-10-CM | POA: Diagnosis not present

## 2018-03-06 DIAGNOSIS — E039 Hypothyroidism, unspecified: Secondary | ICD-10-CM | POA: Diagnosis not present

## 2018-03-06 DIAGNOSIS — Z7951 Long term (current) use of inhaled steroids: Secondary | ICD-10-CM | POA: Diagnosis not present

## 2018-03-06 DIAGNOSIS — G8929 Other chronic pain: Secondary | ICD-10-CM | POA: Insufficient documentation

## 2018-03-06 DIAGNOSIS — Z803 Family history of malignant neoplasm of breast: Secondary | ICD-10-CM | POA: Insufficient documentation

## 2018-03-06 DIAGNOSIS — Z79899 Other long term (current) drug therapy: Secondary | ICD-10-CM | POA: Insufficient documentation

## 2018-03-06 DIAGNOSIS — Z79891 Long term (current) use of opiate analgesic: Secondary | ICD-10-CM | POA: Diagnosis not present

## 2018-03-06 DIAGNOSIS — F419 Anxiety disorder, unspecified: Secondary | ICD-10-CM | POA: Insufficient documentation

## 2018-03-06 DIAGNOSIS — F329 Major depressive disorder, single episode, unspecified: Secondary | ICD-10-CM | POA: Diagnosis not present

## 2018-03-06 DIAGNOSIS — Z853 Personal history of malignant neoplasm of breast: Secondary | ICD-10-CM | POA: Insufficient documentation

## 2018-03-06 DIAGNOSIS — Z79811 Long term (current) use of aromatase inhibitors: Secondary | ICD-10-CM | POA: Insufficient documentation

## 2018-03-06 DIAGNOSIS — Z9989 Dependence on other enabling machines and devices: Secondary | ICD-10-CM | POA: Diagnosis not present

## 2018-03-06 DIAGNOSIS — E785 Hyperlipidemia, unspecified: Secondary | ICD-10-CM | POA: Insufficient documentation

## 2018-03-06 DIAGNOSIS — G4730 Sleep apnea, unspecified: Secondary | ICD-10-CM | POA: Insufficient documentation

## 2018-03-06 HISTORY — PX: PORT-A-CATH REMOVAL: SHX5289

## 2018-03-06 SURGERY — MINOR REMOVAL PORT-A-CATH
Anesthesia: LOCAL

## 2018-03-06 MED ORDER — LIDOCAINE HCL (PF) 1 % IJ SOLN
INTRAMUSCULAR | Status: AC
  Filled 2018-03-06: qty 30

## 2018-03-06 MED ORDER — LIDOCAINE VISCOUS HCL 2 % MT SOLN
OROMUCOSAL | Status: AC
  Filled 2018-03-06: qty 15

## 2018-03-06 MED ORDER — LIDOCAINE HCL (PF) 1 % IJ SOLN
INTRAMUSCULAR | Status: DC | PRN
  Administered 2018-03-06: 5 mL

## 2018-03-06 SURGICAL SUPPLY — 22 items
ADH SKN CLS APL DERMABOND .7 (GAUZE/BANDAGES/DRESSINGS) ×1
CHLORAPREP W/TINT 10.5 ML (MISCELLANEOUS) ×2 IMPLANT
CLOTH BEACON ORANGE TIMEOUT ST (SAFETY) ×2 IMPLANT
DECANTER SPIKE VIAL GLASS SM (MISCELLANEOUS) ×2 IMPLANT
DERMABOND ADVANCED (GAUZE/BANDAGES/DRESSINGS) ×1
DERMABOND ADVANCED .7 DNX12 (GAUZE/BANDAGES/DRESSINGS) ×1 IMPLANT
DRAPE PROXIMA HALF (DRAPES) ×2 IMPLANT
ELECT REM PT RETURN 9FT ADLT (ELECTROSURGICAL) ×2
ELECTRODE REM PT RTRN 9FT ADLT (ELECTROSURGICAL) ×1 IMPLANT
GLOVE BIOGEL PI IND STRL 7.0 (GLOVE) ×1 IMPLANT
GLOVE BIOGEL PI INDICATOR 7.0 (GLOVE) ×1
GLOVE SURG SS PI 7.5 STRL IVOR (GLOVE) ×2 IMPLANT
GOWN STRL REUS W/TWL LRG LVL3 (GOWN DISPOSABLE) ×2 IMPLANT
NDL HYPO 25X1 1.5 SAFETY (NEEDLE) ×1 IMPLANT
NEEDLE HYPO 25X1 1.5 SAFETY (NEEDLE) ×2 IMPLANT
PENCIL HANDSWITCHING (ELECTRODE) ×2 IMPLANT
SPONGE GAUZE 2X2 8PLY STRL LF (GAUZE/BANDAGES/DRESSINGS) ×2 IMPLANT
SUT MNCRL AB 4-0 PS2 18 (SUTURE) ×2 IMPLANT
SUT VIC AB 3-0 SH 27 (SUTURE) ×2
SUT VIC AB 3-0 SH 27X BRD (SUTURE) ×1 IMPLANT
SYR CONTROL 10ML LL (SYRINGE) ×2 IMPLANT
TOWEL OR 17X26 4PK STRL BLUE (TOWEL DISPOSABLE) ×2 IMPLANT

## 2018-03-06 NOTE — Op Note (Signed)
Patient:  Tracy Valentine  DOB:  04-15-1956  MRN:  [id number]   Preop Diagnosis: Left breast carcinoma, finished with chemotherapy  Postop Diagnosis: Same  Procedure: Port-A-Cath removal  Surgeon: Aviva Signs, MD  Anes: Local  Indications: Patient is a 62 year old white female who has finished with chemotherapy for left breast carcinoma and presents for Port-A-Cath removal.  The risks and benefits of the procedure including bleeding and infection were fully explained to the patient, who gave informed consent.  Procedure note: The patient was placed in supine position.  The right upper chest was prepped and draped using the usual sterile technique with ChloraPrep.  Surgical site confirmation was performed.  1% Xylocaine was used for local anesthesia.  Incision was made through the previous surgical incision site.  This was taken down to the port.  The Port-A-Cath was removed in total without difficulty.  It was disposed of.  Subcutaneous layer was reapproximated using a 3-0 Vicryl interrupted suture.  The skin was closed using a 4-0 Monocryl subcuticular suture.  Dermabond was applied.  All tape and needle counts were correct at the end of the procedure.  The patient was discharged from the minor procedure room in good and stable condition.  Complications: None  EBL: Minimal  Specimen: None

## 2018-03-06 NOTE — Interval H&P Note (Signed)
History and Physical Interval Note:  03/06/2018 8:04 AM  Tracy Valentine  has presented today for surgery, with the diagnosis of left breast cancer  The various methods of treatment have been discussed with the patient and family. After consideration of risks, benefits and other options for treatment, the patient has consented to  Procedure(s): MINOR REMOVAL PORT-A-CATH (N/A) as a surgical intervention .  The patient's history has been reviewed, patient examined, no change in status, stable for surgery.  I have reviewed the patient's chart and labs.  Questions were answered to the patient's satisfaction.     Aviva Signs

## 2018-03-06 NOTE — Discharge Instructions (Signed)
Implanted Port Removal, Care After °Refer to this sheet in the next few weeks. These instructions provide you with information about caring for yourself after your procedure. Your health care provider may also give you more specific instructions. Your treatment has been planned according to current medical practices, but problems sometimes occur. Call your health care provider if you have any problems or questions after your procedure. °What can I expect after the procedure? °After the procedure, it is common to have: °· Soreness or pain near your incision. °· Some swelling or bruising near your incision. ° °Follow these instructions at home: °Medicines °· Take over-the-counter and prescription medicines only as told by your health care provider. °· If you were prescribed an antibiotic medicine, take it as told by your health care provider. Do not stop taking the antibiotic even if you start to feel better. °Bathing °· Do not take baths, swim, or use a hot tub until your health care provider approves. Ask your health care provider if you can take showers. You may only be allowed to take sponge baths for bathing. °Incision care °· Follow instructions from your health care provider about how to take care of your incision. Make sure you: °? Wash your hands with soap and water before you change your bandage (dressing). If soap and water are not available, use hand sanitizer. °? Change your dressing as told by your health care provider. °? Keep your dressing dry. °? Leave stitches (sutures), skin glue, or adhesive strips in place. These skin closures may need to stay in place for 2 weeks or longer. If adhesive strip edges start to loosen and curl up, you may trim the loose edges. Do not remove adhesive strips completely unless your health care provider tells you to do that. °· Check your incision area every day for signs of infection. Check for: °? More redness, swelling, or pain. °? More fluid or  blood. °? Warmth. °? Pus or a bad smell. °Driving °· If you received a sedative, do not drive for 24 hours after the procedure. °· If you did not receive a sedative, ask your health care provider when it is safe to drive. °Activity °· Return to your normal activities as told by your health care provider. Ask your health care provider what activities are safe for you. °· Until your health care provider says it is safe: °? Do not lift anything that is heavier than 10 lb (4.5 kg). °? Do not do activities that involve lifting your arms over your head. °General instructions °· Do not use any tobacco products, such as cigarettes, chewing tobacco, and e-cigarettes. Tobacco can delay healing. If you need help quitting, ask your health care provider. °· Keep all follow-up visits as told by your health care provider. This is important. °Contact a health care provider if: °· You have more redness, swelling, or pain around your incision. °· You have more fluid or blood coming from your incision. °· Your incision feels warm to the touch. °· You have pus or a bad smell coming from your incision. °· You have a fever. °· You have pain that is not relieved by your pain medicine. °Get help right away if: °· You have chest pain. °· You have difficulty breathing. °This information is not intended to replace advice given to you by your health care provider. Make sure you discuss any questions you have with your health care provider. °Document Released: 09/20/2015 Document Revised: 03/16/2016 Document Reviewed: 07/14/2015 °Elsevier Interactive Patient   Education © 2018 [employer] ° °

## 2018-03-07 ENCOUNTER — Other Ambulatory Visit (HOSPITAL_COMMUNITY): Payer: Self-pay | Admitting: Adult Health

## 2018-03-07 DIAGNOSIS — C50412 Malignant neoplasm of upper-outer quadrant of left female breast: Secondary | ICD-10-CM

## 2018-03-07 DIAGNOSIS — Z170 Estrogen receptor positive status [ER+]: Principal | ICD-10-CM

## 2018-03-08 ENCOUNTER — Encounter (HOSPITAL_COMMUNITY): Payer: Self-pay | Admitting: General Surgery

## 2018-03-23 DIAGNOSIS — G4733 Obstructive sleep apnea (adult) (pediatric): Secondary | ICD-10-CM | POA: Diagnosis not present

## 2018-03-24 IMAGING — CR DG CHEST 1V PORT
1 series · 1 of 1 positions shown · non-contrast
Comparison: None.

CLINICAL DATA: Post Port-A-Cath insertion

EXAM:
PORTABLE CHEST 1 VIEW

[portable]
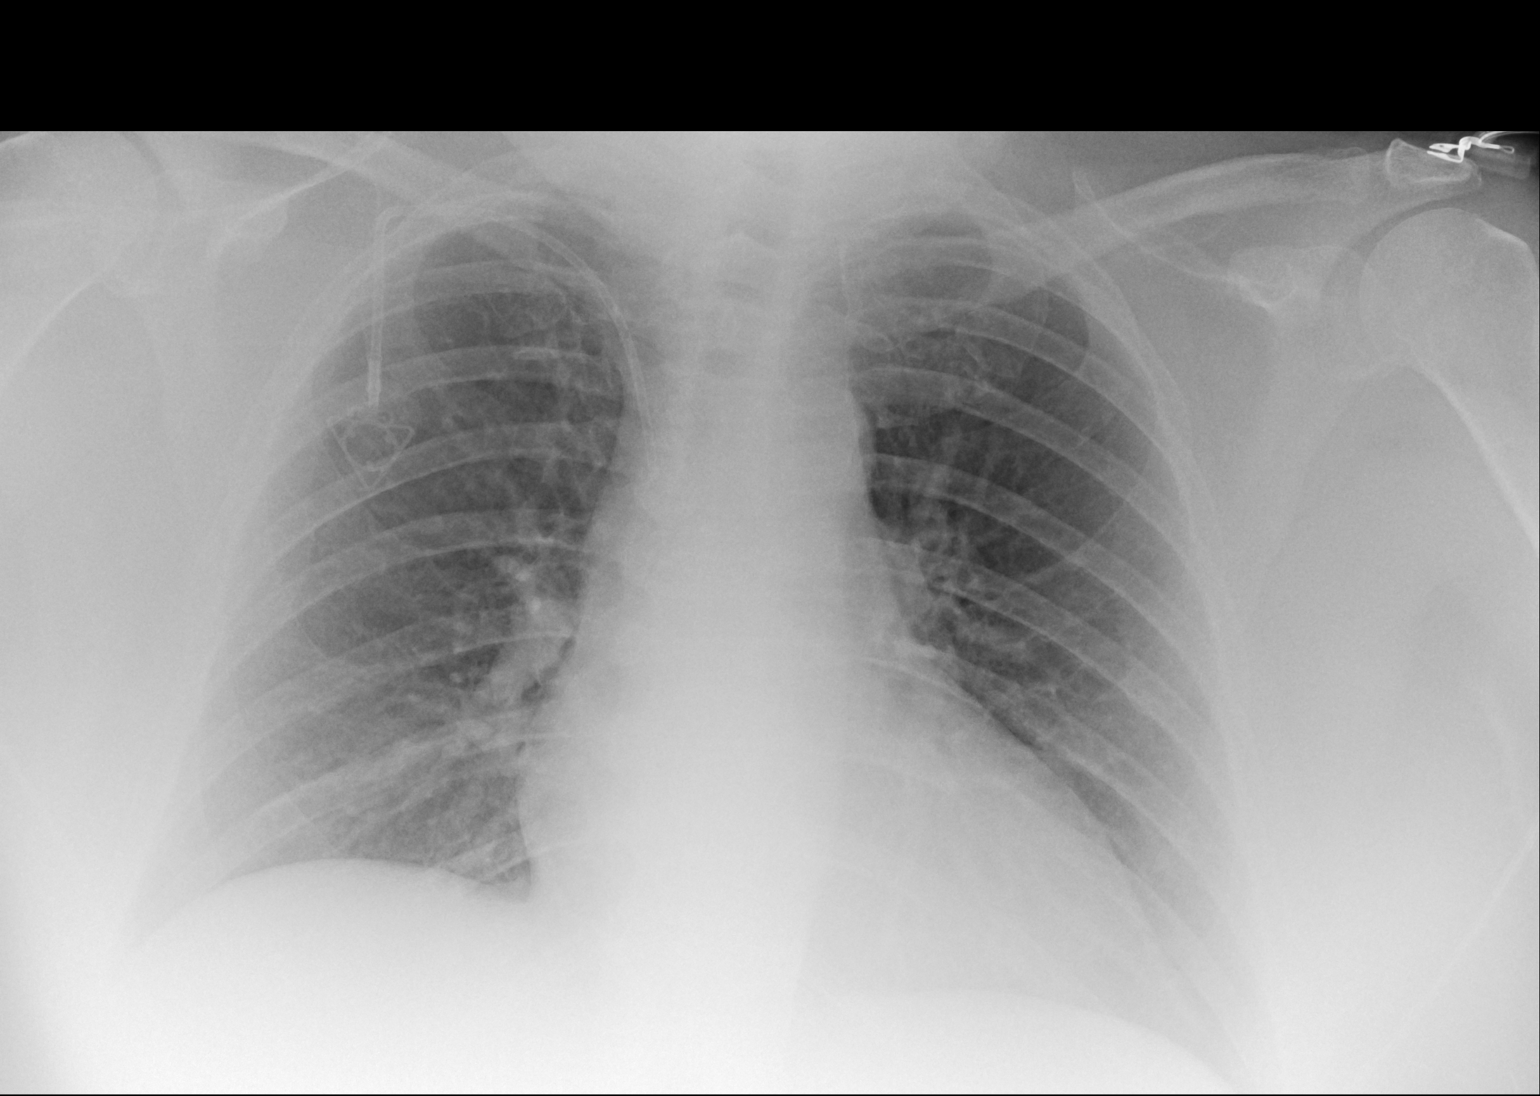

[1 of 1 positions shown; findings below may reference images not displayed]

FINDINGS: Right subclavian Port-A-Cath is in place with the tip in the upper
SVC. No pneumothorax. Heart is upper limits normal in size. Lungs
are clear. No effusions or acute bony abnormality.
IMPRESSION: Right Port-A-Cath tip in the upper SVC. No pneumothorax. No active
disease.

## 2018-03-30 ENCOUNTER — Other Ambulatory Visit (HOSPITAL_COMMUNITY): Payer: Self-pay | Admitting: Adult Health

## 2018-03-30 DIAGNOSIS — Z170 Estrogen receptor positive status [ER+]: Principal | ICD-10-CM

## 2018-03-30 DIAGNOSIS — C50412 Malignant neoplasm of upper-outer quadrant of left female breast: Secondary | ICD-10-CM

## 2018-04-01 ENCOUNTER — Other Ambulatory Visit (HOSPITAL_COMMUNITY): Payer: Self-pay | Admitting: *Deleted

## 2018-04-01 DIAGNOSIS — C50419 Malignant neoplasm of upper-outer quadrant of unspecified female breast: Secondary | ICD-10-CM

## 2018-04-02 ENCOUNTER — Inpatient Hospital Stay (HOSPITAL_COMMUNITY): Payer: Medicare Other | Attending: Hematology

## 2018-04-02 DIAGNOSIS — R110 Nausea: Secondary | ICD-10-CM | POA: Insufficient documentation

## 2018-04-02 DIAGNOSIS — Z79899 Other long term (current) drug therapy: Secondary | ICD-10-CM | POA: Insufficient documentation

## 2018-04-02 DIAGNOSIS — C50412 Malignant neoplasm of upper-outer quadrant of left female breast: Secondary | ICD-10-CM | POA: Diagnosis present

## 2018-04-02 DIAGNOSIS — Z7982 Long term (current) use of aspirin: Secondary | ICD-10-CM | POA: Insufficient documentation

## 2018-04-02 DIAGNOSIS — E039 Hypothyroidism, unspecified: Secondary | ICD-10-CM | POA: Diagnosis not present

## 2018-04-02 DIAGNOSIS — K5900 Constipation, unspecified: Secondary | ICD-10-CM | POA: Diagnosis not present

## 2018-04-02 DIAGNOSIS — Z79811 Long term (current) use of aromatase inhibitors: Secondary | ICD-10-CM | POA: Diagnosis not present

## 2018-04-02 DIAGNOSIS — Z8041 Family history of malignant neoplasm of ovary: Secondary | ICD-10-CM | POA: Diagnosis not present

## 2018-04-02 DIAGNOSIS — C50419 Malignant neoplasm of upper-outer quadrant of unspecified female breast: Secondary | ICD-10-CM

## 2018-04-02 DIAGNOSIS — Z170 Estrogen receptor positive status [ER+]: Secondary | ICD-10-CM | POA: Insufficient documentation

## 2018-04-02 LAB — CBC WITH DIFFERENTIAL/PLATELET
BASOS ABS: 0 10*3/uL (ref 0.0–0.1)
Basophils Relative: 0 %
EOS PCT: 1 %
Eosinophils Absolute: 0.1 10*3/uL (ref 0.0–0.7)
HCT: 41.7 % (ref 36.0–46.0)
Hemoglobin: 13.5 g/dL (ref 12.0–15.0)
Lymphocytes Relative: 26 %
Lymphs Abs: 1.3 10*3/uL (ref 0.7–4.0)
MCH: 32.7 pg (ref 26.0–34.0)
MCHC: 32.4 g/dL (ref 30.0–36.0)
MCV: 101 fL — ABNORMAL HIGH (ref 78.0–100.0)
Monocytes Absolute: 0.3 10*3/uL (ref 0.1–1.0)
Monocytes Relative: 5 %
NEUTROS ABS: 3.3 10*3/uL (ref 1.7–7.7)
Neutrophils Relative %: 68 %
PLATELETS: 183 10*3/uL (ref 150–400)
RBC: 4.13 MIL/uL (ref 3.87–5.11)
RDW: 12.6 % (ref 11.5–15.5)
WBC: 5 10*3/uL (ref 4.0–10.5)

## 2018-04-02 LAB — COMPREHENSIVE METABOLIC PANEL
ALBUMIN: 4.2 g/dL (ref 3.5–5.0)
ALT: 22 U/L (ref 14–54)
AST: 22 U/L (ref 15–41)
Alkaline Phosphatase: 80 U/L (ref 38–126)
Anion gap: 6 (ref 5–15)
BUN: 16 mg/dL (ref 6–20)
CHLORIDE: 102 mmol/L (ref 101–111)
CO2: 31 mmol/L (ref 22–32)
CREATININE: 0.96 mg/dL (ref 0.44–1.00)
Calcium: 10.5 mg/dL — ABNORMAL HIGH (ref 8.9–10.3)
GFR calc Af Amer: >60 mL/min (ref >60)
GLUCOSE: 101 mg/dL — AB (ref 65–99)
Potassium: 4.5 mmol/L (ref 3.5–5.1)
Sodium: 139 mmol/L (ref 135–145)
Total Bilirubin: 0.8 mg/dL (ref 0.3–1.2)
Total Protein: 7.3 g/dL (ref 6.5–8.1)

## 2018-04-03 DIAGNOSIS — G894 Chronic pain syndrome: Secondary | ICD-10-CM | POA: Diagnosis not present

## 2018-04-03 DIAGNOSIS — M5416 Radiculopathy, lumbar region: Secondary | ICD-10-CM | POA: Diagnosis not present

## 2018-04-03 DIAGNOSIS — M47816 Spondylosis without myelopathy or radiculopathy, lumbar region: Secondary | ICD-10-CM | POA: Diagnosis not present

## 2018-04-03 DIAGNOSIS — M47812 Spondylosis without myelopathy or radiculopathy, cervical region: Secondary | ICD-10-CM | POA: Diagnosis not present

## 2018-04-04 ENCOUNTER — Other Ambulatory Visit (HOSPITAL_COMMUNITY): Payer: Medicare Other

## 2018-04-04 ENCOUNTER — Other Ambulatory Visit: Payer: Self-pay

## 2018-04-04 ENCOUNTER — Encounter (HOSPITAL_COMMUNITY): Payer: Self-pay | Admitting: Internal Medicine

## 2018-04-04 ENCOUNTER — Inpatient Hospital Stay (HOSPITAL_BASED_OUTPATIENT_CLINIC_OR_DEPARTMENT_OTHER): Payer: Medicare Other | Admitting: Internal Medicine

## 2018-04-04 VITALS — BP 135/91 | HR 49 | Temp 98.4°F | Resp 16 | Wt 236.0 lb

## 2018-04-04 DIAGNOSIS — E039 Hypothyroidism, unspecified: Secondary | ICD-10-CM

## 2018-04-04 DIAGNOSIS — R110 Nausea: Secondary | ICD-10-CM | POA: Diagnosis not present

## 2018-04-04 DIAGNOSIS — Z79899 Other long term (current) drug therapy: Secondary | ICD-10-CM

## 2018-04-04 DIAGNOSIS — K5900 Constipation, unspecified: Secondary | ICD-10-CM

## 2018-04-04 DIAGNOSIS — C50412 Malignant neoplasm of upper-outer quadrant of left female breast: Secondary | ICD-10-CM

## 2018-04-04 DIAGNOSIS — Z8041 Family history of malignant neoplasm of ovary: Secondary | ICD-10-CM

## 2018-04-04 DIAGNOSIS — Z170 Estrogen receptor positive status [ER+]: Secondary | ICD-10-CM

## 2018-04-04 DIAGNOSIS — Z7982 Long term (current) use of aspirin: Secondary | ICD-10-CM | POA: Diagnosis not present

## 2018-04-04 DIAGNOSIS — Z79811 Long term (current) use of aromatase inhibitors: Secondary | ICD-10-CM

## 2018-04-04 NOTE — Progress Notes (Signed)
Diagnosis Malignant neoplasm of upper-outer quadrant of left breast in female, estrogen receptor positive ([place]) - Plan: MM DIAG BREAST TOMO BILATERAL, CBC with Differential/Platelet, Comprehensive metabolic panel, Lactate dehydrogenase  Staging Cancer Staging Breast cancer of upper-outer quadrant of left female breast ([place]) Staging form: Breast, AJCC 7th Edition - Pathologic: Stage IA (T1c, N0, cM0) - Unsigned - Pathologic: No stage assigned - Unsigned   Assessment and Plan:  1) pT1C,pN0, Mx (Stage IA) with high-grade ER positive PR negative HER-2/neu negative breast cancer status post left breast lumpectomy, adjuvant chemotherapy with TCx4 cycles. Pt also has high-grade DCIS.  Oncotype recurrence score of 30.  BCI-high risk.    Last bilateral diagnostic mammogram was done 07/23/2017 was negative for malignancy. She is set up for bilateral diagnostic mammogram in 07/2018 and pt will RTC to go over results.    She remains on Arimidex which she began in 02/2017  and is recommended for 10 yrs of therapy.   2.  Nausea/Constipation.  I reviewed prior CT scan that was done 09/2017 and was negative.  She is referred to GI for evaluation due to GI complaints.  Pt can continue Zofran and Stool softeners.    3.  Health maintenance.  Pt had dexa done 02/14/2017 that was normal.  Due to risk for osteoporosis with Arimidex, she should continue Calcium and vitamin D.  She will be set up for repeat Dexa in 01/2019.    4.  Hypothyroidism.  Continue monitoring with PCP.    Current Status:  Pt is seen today for follow-up.  She is complaining of nausea and constipation.     Breast cancer of upper-outer quadrant of left female breast ([place])   07/26/2016 Mammogram    Area of developing asymmetry with distortion located within the upper-outer quadrant left breast. Tissue sampling via tomosynthesis guided biopsy is recommended and will be scheduled.  RECOMMENDATION: Left breast tomosynthesis guided biopsy.  This has been scheduled for 07/28/2016.       07/28/2016 Initial Biopsy    Coil shaped clip corresponds to the asymmetry in the outer left breast. Clip is well positioned at the biopsy site. 2. X shaped clip corresponds to the ASYMMETRY/DISTORTION in the upper-outer quadrant of the left breast. Clip is positioned approximately 1 cm medial to the center of the biopsy cavity      07/28/2016 Pathology Results    Estrogen Receptor: 80%, POSITIVE, MODERATE STAINING INTENSITY Progesterone Receptor: 0%, NEGATIVE Proliferation Marker Ki67: 70% HER 2 negative by FISH Breast, left, needle core biopsy, upper outer quadrant - INVASIVE DUCTAL CARCINOMA, SEE COMMENT. - HIGH GRADE DUCTAL CARCINOMA IN SITU WITH NECROSIS.      08/24/2016 Oncotype testing    Recurrence Score Result 30, 10 year risk of distant recurrence Tamoxifen alone 20%      10/03/2016 - 12/05/2016 Chemotherapy    The patient had palonosetron (ALOXI) injection 0.25 mg, 0.25 mg, Intravenous,  Once, 2 of 4 cycles Administration: 0.25 mg (10/24/2016)  pegfilgrastim (NEULASTA ONPRO KIT) injection 6 mg, 6 mg, Subcutaneous, Once, 2 of 4 cycles Administration: 6 mg (10/24/2016)  cyclophosphamide (CYTOXAN) 1,380 mg in sodium chloride 0.9 % 250 mL chemo infusion, 600 mg/m2 = 1,380 mg, Intravenous,  Once, 2 of 4 cycles Administration: 1,380 mg (10/24/2016)  DOCEtaxel (TAXOTERE) 170 mg in dextrose 5 % 250 mL chemo infusion, 75 mg/m2 = 170 mg, Intravenous,  Once, 2 of 4 cycles Dose modification: 65 mg/m2 (original dose 75 mg/m2, Cycle 2, Reason: Dose not tolerated)  fosaprepitant (EMEND)  150 mg, dexamethasone (DECADRON) 12 mg in sodium chloride 0.9 % 145 mL IVPB, , Intravenous,  Once, 1 of 3 cycles Administration:  (10/24/2016)  for chemotherapy treatment.         - 02/06/2017 Radiation Therapy    Completed adjuvant breast RT       02/26/2017 -  Anti-estrogen oral therapy    Started anastrozole      03/05/2017 Pathology Results     BCI-high risk of late recurrence (years 5-10) at 10.6% with a high likelihood of benefit from extended endocrine therapy.  High overall risk of recurrence (years 0-10) 19.5% and a high likelihood of benefit from extended endocrine therapy.      11/14/2017 Genetic Testing    Negative genetic testing on the Multi-cancer panel.  The Multi-Gene Panel offered by [facility] includes sequencing and/or deletion duplication testing of the following 83 genes: ALK, APC, ATM, AXIN2,BAP1,  BARD1, BLM, BMPR1A, BRCA1, BRCA2, BRIP1, CASR, CDC73, CDH1, CDK4, CDKN1B, CDKN1C, CDKN2A (p14ARF), CDKN2A (p16INK4a), CEBPA, CHEK2, CTNNA1, DICER1, DIS3L2, EGFR (c.2369C>T, p.Thr790Met variant only), EPCAM (Deletion/duplication testing only), FH, FLCN, GATA2, GPC3, GREM1 (Promoter region deletion/duplication testing only), HOXB13 (c.251G>A, p.Gly84Glu), HRAS, KIT, MAX, MEN1, MET, MITF (c.952G>A, p.Glu318Lys variant only), MLH1, MSH2, MSH3, MSH6, MUTYH, NBN, NF1, NF2, NTHL1, PALB2, PDGFRA, PHOX2B, PMS2, POLD1, POLE, POT1, PRKAR1A, PTCH1, PTEN, RAD50, RAD51C, RAD51D, RB1, RECQL4, RET, RUNX1, SDHAF2, SDHA (sequence changes only), SDHB, SDHC, SDHD, SMAD4, SMARCA4, SMARCB1, SMARCE1, STK11, SUFU, TERT, TERT, TMEM127, TP53, TSC1, TSC2, VHL, WRN and WT1. The report date is November 14, 2017.         Problem List Patient Active Problem List   Diagnosis Date Noted  . Genetic testing [Z13.79] 11/16/2017  . Family history of ovarian cancer [Z80.41]   . Family history of uterine cancer [Z80.49]   . Dysuria [R30.0] 10/09/2016  . Nausea without vomiting [R11.0] 10/09/2016  . Skin infection [L08.9] 10/09/2016  . Breast cancer of upper-outer quadrant of left female breast ([place]) [C50.412] 08/27/2016  . HTN (hypertension) [I10] 07/25/2013  . Hypothyroidism [E03.9] 07/25/2013  . Anxiety [F41.9] 07/25/2013  . Chest pain [R07.9] 07/25/2013    Past Medical History Past Medical History:  Diagnosis Date  . Anxiety   . Breast cancer (HCC)     breast  . Chronic pain   . DDD (degenerative disc disease), cervical   . DDD (degenerative disc disease), lumbar   . Degenerative disc disease at L5-S1 level   . Depression   . Family history of ovarian cancer   . Family history of uterine cancer   . GERD (gastroesophageal reflux disease)   . History of kidney stones   . Hyperlipidemia   . Hypothyroidism   . Obesity   . Osteoarthritis   . Personal history of chemotherapy   . PONV (postoperative nausea and vomiting)   . Sleep apnea    uses CPAP  . Spinal stenosis   . Spinal stenosis     Past Surgical History Past Surgical History:  Procedure Laterality Date  . ABDOMINAL HYSTERECTOMY     partial hyst, still have ovaries  . BREAST LUMPECTOMY Left 08/24/2016  . BREAST LUMPECTOMY WITH RADIOACTIVE SEED AND SENTINEL LYMPH NODE BIOPSY Left 08/24/2016   Procedure: LEFT BREAST PARTIAL MASTECTOMY WITH RADIOACTIVE SEED AND LEFT SENTINEL LYMPH NODE MAPPING;  Surgeon: Erroll Luna, MD;  Location: [facility];  Service: General;  Laterality: Left;  . CHOLECYSTECTOMY    . FOOT FOREIGN BODY REMOVAL Left   . PORT-A-CATH REMOVAL N/A  03/06/2018   Procedure: MINOR REMOVAL PORT-A-CATH;  Surgeon: Aviva Signs, MD;  Location: [facility];  Service: General;  Laterality: N/A;  . PORTACATH PLACEMENT Right 09/29/2016   Procedure: INSERTION PORT-A-CATH RIGHT SUBCLAVIAN;  Surgeon: Aviva Signs, MD;  Location: [facility];  Service: General;  Laterality: Right;    Family History Family History  Problem Relation Age of Onset  . COPD Mother   . Ovarian cancer Mother 35  . Uterine cancer Mother 25  . Heart attack Father 73  . Diabetes Sister   . Heart disease Sister   . Kidney cancer Brother 37  . Breast cancer Cousin   . Kidney failure Sister   . Thyroid disease Sister   . Ovarian cancer Maternal Grandmother 11  . Cancer Paternal Aunt        NOS  . Cancer Paternal Uncle        NOS  . Colon cancer Other        2 distant cousins with colon  cancer in their 74s     Social History  reports that she has never smoked. She has never used smokeless tobacco. She reports that she does not drink alcohol or use drugs.  Medications  Current Outpatient Medications:  .  ALPRAZolam (XANAX) 1 MG tablet, Take 1 mg by mouth 4 (four) times daily as needed (for panic attacks.). , Disp: , Rfl:  .  aspirin EC 81 MG tablet, Take 81 mg by mouth at bedtime., Disp: , Rfl:  .  calcium carbonate (TUMS - DOSED IN MG ELEMENTAL CALCIUM) 500 MG chewable tablet, Chew 2 tablets by mouth daily. , Disp: , Rfl:  .  Cholecalciferol (VITAMIN D3) 2000 units TABS, Take 2,000 Units by mouth daily., Disp: , Rfl:  .  diclofenac sodium (VOLTAREN) 1 % GEL, Apply 2-4 g topically 4 (four) times daily as needed (for knee/hip pain.)., Disp: , Rfl:  .  fluticasone (FLONASE) 50 MCG/ACT nasal spray, Place 1 spray into both nostrils daily. May repeat dose in evening if needed, Disp: , Rfl:  .  ipratropium (ATROVENT) 0.03 % nasal spray, Place 1 spray into both nostrils daily. , Disp: , Rfl: 4 .  Krill Oil 350 MG CAPS, Take 350 mg by mouth daily., Disp: , Rfl:  .  letrozole (FEMARA) 2.5 MG tablet, TAKE 1 TABLET BY MOUTH EVERY DAY, Disp: 90 tablet, Rfl: 0 .  levofloxacin (LEVAQUIN) 500 MG tablet, Take 500 mg by mouth daily., Disp: , Rfl: 0 .  levothyroxine (SYNTHROID, LEVOTHROID) 100 MCG tablet, Take 100 mcg by mouth daily before breakfast. , Disp: , Rfl: 5 .  lidocaine-prilocaine (EMLA) cream, APPLY A QUARTER SIZE AMOUNT TO PORT SITE 1 HOUR PRIOR TO CHEMO. DO NOT RUB IN. COVER WITH PLASTIC WRAP., Disp: 30 g, Rfl: 3 .  montelukast (SINGULAIR) 10 MG tablet, Take 10 mg by mouth daily., Disp: , Rfl: 11 .  ondansetron (ZOFRAN) 4 MG tablet, Take 4-8 mg by mouth every 6 (six) hours as needed for nausea or vomiting. , Disp: , Rfl:  .  ondansetron (ZOFRAN) 8 MG tablet, TAKE 1 TABLET BY MOUTH EVERY 8 HOURS AS NEEDED FOR nausea OR vomiting, Disp: 30 tablet, Rfl: 2 .  Oxycodone HCl 10 MG TABS,  Take 10 mg by mouth every 6 (six) hours as needed (for pain.). , Disp: , Rfl: 0 .  polyethylene glycol powder (GLYCOLAX/MIRALAX) powder, Take 17 g by mouth 2 (two) times daily., Disp: , Rfl:  .  sertraline (ZOLOFT) 100 MG tablet,  Take 100 mg by mouth daily., Disp: , Rfl: 0 .  simvastatin (ZOCOR) 20 MG tablet, Take 20 mg by mouth at bedtime. , Disp: , Rfl:  .  traZODone (DESYREL) 50 MG tablet, Take 100 mg by mouth at bedtime., Disp: , Rfl:  No current facility-administered medications for this visit.   Facility-Administered Medications Ordered in Other Visits:  .  sodium chloride flush (NS) 0.9 % injection 10 mL, 10 mL, Intravenous, PRN, Holley Bouche, NP, 10 mL at 10/04/17 1111  Allergies Adhesive [tape]; Amoxicillin; Darvocet [propoxyphene n-acetaminophen]; Doxycycline; Erythromycin; Shellfish allergy; Zithromax [azithromycin]; and Latex  Review of Systems Review of Systems - Oncology ROS as per HPI other wise negative except for nausea, constipation, bruising, neuropathy.     Physical Exam  Vitals Wt Readings from Last 3 Encounters:  04/04/18 236 lb (107 kg)  02/28/18 238 lb (108 kg)  10/04/17 227 lb 3.2 oz (103.1 kg)   Temp Readings from Last 3 Encounters:  04/04/18 98.4 F (36.9 C) (Oral)  03/06/18 98 F (36.7 C)  02/28/18 98.8 F (37.1 C)   BP Readings from Last 3 Encounters:  04/04/18 (!) 135/91  03/06/18 123/69  02/28/18 (!) 156/78   Pulse Readings from Last 3 Encounters:  04/04/18 (!) 49  03/06/18 (!) 54  02/28/18 61   Constitutional: Well-developed, well-nourished, and in no distress.   HENT: Head: Normocephalic and atraumatic.  Mouth/Throat: No oropharyngeal exudate. Mucosa moist. Eyes: Pupils are equal, round, and reactive to light. Conjunctivae are normal. No scleral icterus.  Neck: Normal range of motion. Neck supple. No JVD present.  Cardiovascular: Normal rate, regular rhythm and normal heart sounds.  Exam reveals no gallop and no friction rub.    No murmur heard. Pulmonary/Chest: Effort normal and breath sounds normal. No respiratory distress. No wheezes.No rales.  Abdominal: Soft. Bowel sounds are normal. No distension. There is no tenderness. There is no guarding. Scars on abdomen from prior surgeries.   Musculoskeletal: No edema or tenderness.  Lymphadenopathy: No cervical, axillary or supraclavicular adenopathy.  Neurological: Alert and oriented to person, place, and time. No cranial nerve deficit.  Skin: Skin is warm and dry. No rash noted. No erythema. No pallor.  Psychiatric: Affect and judgment normal.  Bilateral breast exam:  Chaperone present.  Lumpectomy changes noted in left breast.  No dominant masses noted bilaterally.    Labs Appointment on 04/02/2018  Component Date Value Ref Range Status  . WBC 04/02/2018 5.0  4.0 - 10.5 K/uL Final  . RBC 04/02/2018 4.13  3.87 - 5.11 MIL/uL Final  . Hemoglobin 04/02/2018 13.5  12.0 - 15.0 g/dL Final  . HCT 04/02/2018 41.7  36.0 - 46.0 % Final  . MCV 04/02/2018 101.0* 78.0 - 100.0 fL Final  . MCH 04/02/2018 32.7  26.0 - 34.0 pg Final  . MCHC 04/02/2018 32.4  30.0 - 36.0 g/dL Final  . RDW 04/02/2018 12.6  11.5 - 15.5 % Final  . Platelets 04/02/2018 183  150 - 400 K/uL Final  . Neutrophils Relative % 04/02/2018 68  % Final  . Neutro Abs 04/02/2018 3.3  1.7 - 7.7 K/uL Final  . Lymphocytes Relative 04/02/2018 26  % Final  . Lymphs Abs 04/02/2018 1.3  0.7 - 4.0 K/uL Final  . Monocytes Relative 04/02/2018 5  % Final  . Monocytes Absolute 04/02/2018 0.3  0.1 - 1.0 K/uL Final  . Eosinophils Relative 04/02/2018 1  % Final  . Eosinophils Absolute 04/02/2018 0.1  0.0 - 0.7 K/uL Final  .  Basophils Relative 04/02/2018 0  % Final  . Basophils Absolute 04/02/2018 0.0  0.0 - 0.1 K/uL Final   Performed at [facility], [address] [place], [place] [address]  . Sodium 04/02/2018 139  135 - 145 mmol/L Final  . Potassium 04/02/2018 4.5  3.5 - 5.1 mmol/L Final  . Chloride 04/02/2018 102   101 - 111 mmol/L Final  . CO2 04/02/2018 31  22 - 32 mmol/L Final  . Glucose, Bld 04/02/2018 101* 65 - 99 mg/dL Final  . BUN 04/02/2018 16  6 - 20 mg/dL Final  . Creatinine, Ser 04/02/2018 0.96  0.44 - 1.00 mg/dL Final  . Calcium 04/02/2018 10.5* 8.9 - 10.3 mg/dL Final  . Total Protein 04/02/2018 7.3  6.5 - 8.1 g/dL Final  . Albumin 04/02/2018 4.2  3.5 - 5.0 g/dL Final  . AST 04/02/2018 22  15 - 41 U/L Final  . ALT 04/02/2018 22  14 - 54 U/L Final  . Alkaline Phosphatase 04/02/2018 80  38 - 126 U/L Final  . Total Bilirubin 04/02/2018 0.8  0.3 - 1.2 mg/dL Final  . GFR calc non Af Amer 04/02/2018 >60  >60 mL/min Final  . GFR calc Af Amer 04/02/2018 >60  >60 mL/min Final   Comment: (NOTE) The eGFR has been calculated using the CKD EPI equation. This calculation has not been validated in all clinical situations. eGFR's persistently <60 mL/min signify possible Chronic Kidney Disease.   Georgiann Hahn gap 04/02/2018 6  5 - 15 Final   Performed at [facility], [address], [place], [place] [address]     Pathology Orders Placed This Encounter  Procedures  . MM DIAG BREAST TOMO BILATERAL    Standing Status:   Future    Standing Expiration Date:   04/05/2019    Order Specific Question:   Reason for Exam (SYMPTOM  OR DIAGNOSIS REQUIRED)    Answer:   left breast cancer    Order Specific Question:   Is the patient pregnant?    Answer:   No    Order Specific Question:   Preferred imaging location?    Answer:   [facility]  . CBC with Differential/Platelet    Standing Status:   Future    Standing Expiration Date:   04/05/2019  . Comprehensive metabolic panel    Standing Status:   Future    Standing Expiration Date:   04/05/2019  . Lactate dehydrogenase    Standing Status:   Future    Standing Expiration Date:   04/05/2019       Zoila Shutter MD

## 2018-04-22 DIAGNOSIS — G4733 Obstructive sleep apnea (adult) (pediatric): Secondary | ICD-10-CM | POA: Diagnosis not present

## 2018-05-23 DIAGNOSIS — G4733 Obstructive sleep apnea (adult) (pediatric): Secondary | ICD-10-CM | POA: Diagnosis not present

## 2018-06-07 ENCOUNTER — Other Ambulatory Visit (HOSPITAL_COMMUNITY): Payer: Self-pay | Admitting: *Deleted

## 2018-06-07 DIAGNOSIS — C50412 Malignant neoplasm of upper-outer quadrant of left female breast: Secondary | ICD-10-CM

## 2018-06-07 DIAGNOSIS — Z170 Estrogen receptor positive status [ER+]: Principal | ICD-10-CM

## 2018-06-07 MED ORDER — LETROZOLE 2.5 MG PO TABS: 2.5000 mg | ORAL_TABLET | Freq: Every day | ORAL | 3 refills | Status: DC

## 2018-06-14 DIAGNOSIS — G4733 Obstructive sleep apnea (adult) (pediatric): Secondary | ICD-10-CM | POA: Diagnosis not present

## 2018-06-18 ENCOUNTER — Other Ambulatory Visit (HOSPITAL_COMMUNITY): Payer: Self-pay | Admitting: *Deleted

## 2018-06-18 DIAGNOSIS — C50412 Malignant neoplasm of upper-outer quadrant of left female breast: Secondary | ICD-10-CM

## 2018-06-18 DIAGNOSIS — Z170 Estrogen receptor positive status [ER+]: Principal | ICD-10-CM

## 2018-06-18 DIAGNOSIS — G894 Chronic pain syndrome: Secondary | ICD-10-CM | POA: Diagnosis not present

## 2018-06-18 DIAGNOSIS — M47816 Spondylosis without myelopathy or radiculopathy, lumbar region: Secondary | ICD-10-CM | POA: Diagnosis not present

## 2018-06-18 DIAGNOSIS — M47812 Spondylosis without myelopathy or radiculopathy, cervical region: Secondary | ICD-10-CM | POA: Diagnosis not present

## 2018-06-18 DIAGNOSIS — M5416 Radiculopathy, lumbar region: Secondary | ICD-10-CM | POA: Diagnosis not present

## 2018-06-18 MED ORDER — LETROZOLE 2.5 MG PO TABS: 2.5000 mg | ORAL_TABLET | Freq: Every day | ORAL | 3 refills | Status: DC

## 2018-06-23 DIAGNOSIS — G4733 Obstructive sleep apnea (adult) (pediatric): Secondary | ICD-10-CM | POA: Diagnosis not present

## 2018-07-18 ENCOUNTER — Other Ambulatory Visit (HOSPITAL_COMMUNITY): Payer: Self-pay | Admitting: Internal Medicine

## 2018-07-18 DIAGNOSIS — Z853 Personal history of malignant neoplasm of breast: Secondary | ICD-10-CM

## 2018-07-18 DIAGNOSIS — R928 Other abnormal and inconclusive findings on diagnostic imaging of breast: Secondary | ICD-10-CM

## 2018-07-19 DIAGNOSIS — Z6841 Body Mass Index (BMI) 40.0 and over, adult: Secondary | ICD-10-CM | POA: Diagnosis not present

## 2018-07-19 DIAGNOSIS — G894 Chronic pain syndrome: Secondary | ICD-10-CM | POA: Diagnosis not present

## 2018-07-19 DIAGNOSIS — A4902 Methicillin resistant Staphylococcus aureus infection, unspecified site: Secondary | ICD-10-CM | POA: Diagnosis not present

## 2018-07-19 DIAGNOSIS — L02426 Furuncle of left lower limb: Secondary | ICD-10-CM | POA: Diagnosis not present

## 2018-07-23 DIAGNOSIS — G4733 Obstructive sleep apnea (adult) (pediatric): Secondary | ICD-10-CM | POA: Diagnosis not present

## 2018-07-30 ENCOUNTER — Ambulatory Visit (HOSPITAL_COMMUNITY): Admission: RE | Admit: 2018-07-30 | Payer: Medicare Other | Source: Ambulatory Visit

## 2018-07-30 ENCOUNTER — Ambulatory Visit (HOSPITAL_COMMUNITY)
Admission: RE | Admit: 2018-07-30 | Discharge: 2018-07-30 | Disposition: A | Discharge status: Home / Self Care | Payer: Medicare Other | Source: Ambulatory Visit | Attending: Internal Medicine | Admitting: Internal Medicine

## 2018-07-30 ENCOUNTER — Inpatient Hospital Stay (HOSPITAL_COMMUNITY): Payer: Medicare Other | Attending: Hematology

## 2018-07-30 ENCOUNTER — Other Ambulatory Visit (HOSPITAL_COMMUNITY): Payer: Self-pay | Admitting: Hematology

## 2018-07-30 ENCOUNTER — Ambulatory Visit (HOSPITAL_COMMUNITY): Payer: Medicare Other

## 2018-07-30 DIAGNOSIS — Z170 Estrogen receptor positive status [ER+]: Secondary | ICD-10-CM | POA: Insufficient documentation

## 2018-07-30 DIAGNOSIS — C50412 Malignant neoplasm of upper-outer quadrant of left female breast: Secondary | ICD-10-CM | POA: Diagnosis not present

## 2018-07-30 DIAGNOSIS — Z9221 Personal history of antineoplastic chemotherapy: Secondary | ICD-10-CM | POA: Diagnosis not present

## 2018-07-30 DIAGNOSIS — Z923 Personal history of irradiation: Secondary | ICD-10-CM | POA: Insufficient documentation

## 2018-07-30 DIAGNOSIS — Z79899 Other long term (current) drug therapy: Secondary | ICD-10-CM | POA: Insufficient documentation

## 2018-07-30 DIAGNOSIS — R928 Other abnormal and inconclusive findings on diagnostic imaging of breast: Secondary | ICD-10-CM | POA: Diagnosis not present

## 2018-07-30 DIAGNOSIS — Z7982 Long term (current) use of aspirin: Secondary | ICD-10-CM | POA: Diagnosis not present

## 2018-07-30 DIAGNOSIS — Z853 Personal history of malignant neoplasm of breast: Secondary | ICD-10-CM | POA: Diagnosis not present

## 2018-07-30 LAB — COMPREHENSIVE METABOLIC PANEL
ALT: 17 U/L (ref 0–44)
AST: 16 U/L (ref 15–41)
Albumin: 4.1 g/dL (ref 3.5–5.0)
Alkaline Phosphatase: 73 U/L (ref 38–126)
Anion gap: 8 (ref 5–15)
BUN: 16 mg/dL (ref 6–20)
CALCIUM: 9.9 mg/dL (ref 8.9–10.3)
CHLORIDE: 104 mmol/L (ref 98–111)
CO2: 26 mmol/L (ref 22–32)
Creatinine, Ser: 1.05 mg/dL — ABNORMAL HIGH (ref 0.44–1.00)
GFR calc non Af Amer: 57 mL/min — ABNORMAL LOW (ref >60)
Glucose, Bld: 101 mg/dL — ABNORMAL HIGH (ref 70–99)
Potassium: 4.7 mmol/L (ref 3.5–5.1)
SODIUM: 138 mmol/L (ref 135–145)
Total Bilirubin: 0.3 mg/dL (ref 0.3–1.2)
Total Protein: 7.1 g/dL (ref 6.5–8.1)

## 2018-07-30 LAB — CBC WITH DIFFERENTIAL/PLATELET
Basophils Absolute: 0 10*3/uL (ref 0.0–0.1)
Basophils Relative: 1 %
EOS ABS: 0.1 10*3/uL (ref 0.0–0.5)
EOS PCT: 2 %
HCT: 40.5 % (ref 36.0–46.0)
Hemoglobin: 13.1 g/dL (ref 12.0–15.0)
LYMPHS ABS: 1.9 10*3/uL (ref 0.7–4.0)
Lymphocytes Relative: 35 %
MCH: 32.3 pg (ref 26.0–34.0)
MCHC: 32.3 g/dL (ref 30.0–36.0)
MCV: 100 fL (ref 80.0–100.0)
MONOS PCT: 7 %
Monocytes Absolute: 0.4 10*3/uL (ref 0.1–1.0)
Neutro Abs: 3 10*3/uL (ref 1.7–7.7)
Neutrophils Relative %: 55 %
PLATELETS: 191 10*3/uL (ref 150–400)
RBC: 4.05 MIL/uL (ref 3.87–5.11)
RDW: 13 % (ref 11.5–15.5)
WBC: 5.3 10*3/uL (ref 4.0–10.5)
nRBC: 0 % (ref 0.0–0.2)

## 2018-07-30 LAB — LACTATE DEHYDROGENASE: LDH: 133 U/L (ref 98–192)

## 2018-08-02 ENCOUNTER — Inpatient Hospital Stay (HOSPITAL_COMMUNITY): Payer: Medicare Other | Admitting: Hematology

## 2018-08-02 ENCOUNTER — Ambulatory Visit (HOSPITAL_COMMUNITY): Payer: Medicare Other | Admitting: Internal Medicine

## 2018-08-02 ENCOUNTER — Encounter (HOSPITAL_COMMUNITY): Payer: Self-pay | Admitting: Hematology

## 2018-08-02 VITALS — BP 130/73 | HR 50 | Temp 97.5°F | Resp 20 | Wt 239.0 lb

## 2018-08-02 DIAGNOSIS — Z170 Estrogen receptor positive status [ER+]: Secondary | ICD-10-CM

## 2018-08-02 DIAGNOSIS — C50412 Malignant neoplasm of upper-outer quadrant of left female breast: Secondary | ICD-10-CM

## 2018-08-02 DIAGNOSIS — Z923 Personal history of irradiation: Secondary | ICD-10-CM | POA: Diagnosis not present

## 2018-08-02 DIAGNOSIS — Z79899 Other long term (current) drug therapy: Secondary | ICD-10-CM | POA: Diagnosis not present

## 2018-08-02 DIAGNOSIS — G4733 Obstructive sleep apnea (adult) (pediatric): Secondary | ICD-10-CM | POA: Diagnosis not present

## 2018-08-02 DIAGNOSIS — R110 Nausea: Secondary | ICD-10-CM

## 2018-08-02 DIAGNOSIS — Z9221 Personal history of antineoplastic chemotherapy: Secondary | ICD-10-CM

## 2018-08-02 DIAGNOSIS — Z7982 Long term (current) use of aspirin: Secondary | ICD-10-CM | POA: Diagnosis not present

## 2018-08-02 MED ORDER — ONDANSETRON HCL 4 MG PO TABS: 4.0000 mg | ORAL_TABLET | Freq: Two times a day (BID) | ORAL | 3 refills | Status: DC | PRN

## 2018-08-02 NOTE — Progress Notes (Signed)
Tracy Valentine, [place] [address]   CLINIC:  Medical Oncology/Hematology  PCP:  Sharilyn Sites, [place] [address] [phone]   REASON FOR VISIT: Follow-up for left breast cancer  CURRENT THERAPY: Arimidex   BRIEF ONCOLOGIC HISTORY:    Breast cancer of upper-outer quadrant of left female breast ([place])   07/26/2016 Mammogram    Area of developing asymmetry with distortion located within the upper-outer quadrant left breast. Tissue sampling via tomosynthesis guided biopsy is recommended and will be scheduled.  RECOMMENDATION: Left breast tomosynthesis guided biopsy. This has been scheduled for 07/28/2016.     07/28/2016 Initial Biopsy    Coil shaped clip corresponds to the asymmetry in the outer left breast. Clip is well positioned at the biopsy site. 2. X shaped clip corresponds to the ASYMMETRY/DISTORTION in the upper-outer quadrant of the left breast. Clip is positioned approximately 1 cm medial to the center of the biopsy cavity    07/28/2016 Pathology Results    Estrogen Receptor: 80%, POSITIVE, MODERATE STAINING INTENSITY Progesterone Receptor: 0%, NEGATIVE Proliferation Marker Ki67: 70% HER 2 negative by FISH Breast, left, needle core biopsy, upper outer quadrant - INVASIVE DUCTAL CARCINOMA, SEE COMMENT. - HIGH GRADE DUCTAL CARCINOMA IN SITU WITH NECROSIS.    08/24/2016 Oncotype testing    Recurrence Score Result 30, 10 year risk of distant recurrence Tamoxifen alone 20%    10/03/2016 - 12/05/2016 Chemotherapy    The patient had palonosetron (ALOXI) injection 0.25 mg, 0.25 mg, Intravenous,  Once, 2 of 4 cycles Administration: 0.25 mg (10/24/2016)  pegfilgrastim (NEULASTA ONPRO KIT) injection 6 mg, 6 mg, Subcutaneous, Once, 2 of 4 cycles Administration: 6 mg (10/24/2016)  cyclophosphamide (CYTOXAN) 1,380 mg in sodium chloride 0.9 % 250 mL chemo infusion, 600 mg/m2 = 1,380 mg, Intravenous,  Once, 2 of 4  cycles Administration: 1,380 mg (10/24/2016)  DOCEtaxel (TAXOTERE) 170 mg in dextrose 5 % 250 mL chemo infusion, 75 mg/m2 = 170 mg, Intravenous,  Once, 2 of 4 cycles Dose modification: 65 mg/m2 (original dose 75 mg/m2, Cycle 2, Reason: Dose not tolerated)  fosaprepitant (EMEND) 150 mg, dexamethasone (DECADRON) 12 mg in sodium chloride 0.9 % 145 mL IVPB, , Intravenous,  Once, 1 of 3 cycles Administration:  (10/24/2016)  for chemotherapy treatment.       - 02/06/2017 Radiation Therapy    Completed adjuvant breast RT     02/26/2017 -  Anti-estrogen oral therapy    Started anastrozole    03/05/2017 Pathology Results    BCI-high risk of late recurrence (years 5-10) at 10.6% with a high likelihood of benefit from extended endocrine therapy.  High overall risk of recurrence (years 0-10) 19.5% and a high likelihood of benefit from extended endocrine therapy.    11/14/2017 Genetic Testing    Negative genetic testing on the Multi-cancer panel.  The Multi-Gene Panel offered by [facility] includes sequencing and/or deletion duplication testing of the following 83 genes: ALK, APC, ATM, AXIN2,BAP1,  BARD1, BLM, BMPR1A, BRCA1, BRCA2, BRIP1, CASR, CDC73, CDH1, CDK4, CDKN1B, CDKN1C, CDKN2A (p14ARF), CDKN2A (p16INK4a), CEBPA, CHEK2, CTNNA1, DICER1, DIS3L2, EGFR (c.2369C>T, p.Thr790Met variant only), EPCAM (Deletion/duplication testing only), FH, FLCN, GATA2, GPC3, GREM1 (Promoter region deletion/duplication testing only), HOXB13 (c.251G>A, p.Gly84Glu), HRAS, KIT, MAX, MEN1, MET, MITF (c.952G>A, p.Glu318Lys variant only), MLH1, MSH2, MSH3, MSH6, MUTYH, NBN, NF1, NF2, NTHL1, PALB2, PDGFRA, PHOX2B, PMS2, POLD1, POLE, POT1, PRKAR1A, PTCH1, PTEN, RAD50, RAD51C, RAD51D, RB1, RECQL4, RET, RUNX1, SDHAF2, SDHA (sequence changes only), SDHB, SDHC, SDHD,  SMAD4, SMARCA4, SMARCB1, SMARCE1, STK11, SUFU, TERT, TERT, TMEM127, TP53, TSC1, TSC2, VHL, WRN and WT1. The report date is November 14, 2017.       CANCER STAGING: Cancer  Staging Breast cancer of upper-outer quadrant of left female breast ([place]) Staging form: Breast, AJCC 7th Edition - Pathologic: Stage IA (T1c, N0, cM0) - Unsigned - Pathologic: No stage assigned - Unsigned    INTERVAL HISTORY:  Tracy Valentine 62 y.o. female returns for routine follow-up for left breast cancer. Patient is here and tolerating her medication well except she reports hair thinning, easy bruising and bleeding. Otherwise she has no complaints. She reports her appetite at 100% and her energy level at 50%. She denies any new skin rashes or mouth sores. Denies any nausea, vomiting, or diarrhea. Denies any new pains or lumps.     REVIEW OF SYSTEMS:  Review of Systems  Neurological: Positive for dizziness.  Hematological: Bruises/bleeds easily.  All other systems reviewed and are negative.    PAST MEDICAL/SURGICAL HISTORY:  Past Medical History:  Diagnosis Date  . Anxiety   . Breast cancer (HCC)    breast  . Chronic pain   . DDD (degenerative disc disease), cervical   . DDD (degenerative disc disease), lumbar   . Degenerative disc disease at L5-S1 level   . Depression   . Family history of ovarian cancer   . Family history of uterine cancer   . GERD (gastroesophageal reflux disease)   . History of kidney stones   . Hyperlipidemia   . Hypothyroidism   . Obesity   . Osteoarthritis   . Personal history of chemotherapy   . PONV (postoperative nausea and vomiting)   . Sleep apnea    uses CPAP  . Spinal stenosis   . Spinal stenosis    Past Surgical History:  Procedure Laterality Date  . ABDOMINAL HYSTERECTOMY     partial hyst, still have ovaries  . BREAST LUMPECTOMY Left 08/24/2016  . BREAST LUMPECTOMY WITH RADIOACTIVE SEED AND SENTINEL LYMPH NODE BIOPSY Left 08/24/2016   Procedure: LEFT BREAST PARTIAL MASTECTOMY WITH RADIOACTIVE SEED AND LEFT SENTINEL LYMPH NODE MAPPING;  Surgeon: Erroll Luna, MD;  Location: [facility];  Service: General;   Laterality: Left;  . CHOLECYSTECTOMY    . FOOT FOREIGN BODY REMOVAL Left   . PORT-A-CATH REMOVAL N/A 03/06/2018   Procedure: MINOR REMOVAL PORT-A-CATH;  Surgeon: Aviva Signs, MD;  Location: [facility];  Service: General;  Laterality: N/A;  . PORTACATH PLACEMENT Right 09/29/2016   Procedure: INSERTION PORT-A-CATH RIGHT SUBCLAVIAN;  Surgeon: Aviva Signs, MD;  Location: [facility];  Service: General;  Laterality: Right;     SOCIAL HISTORY:  Social History   Socioeconomic History  . Marital status: Married    Spouse name: Not on file  . Number of children: Not on file  . Years of education: Not on file  . Highest education level: Not on file  Occupational History  . Not on file  Social Needs  . Financial resource strain: Not on file  . Food insecurity:    Worry: Not on file    Inability: Not on file  . Transportation needs:    Medical: Not on file    Non-medical: Not on file  Tobacco Use  . Smoking status: Never Smoker  . Smokeless tobacco: Never Used  Substance and Sexual Activity  . Alcohol use: No  . Drug use: No  . Sexual activity: Yes    Birth control/protection: None  Comment: married-1 grown son  Lifestyle  . Physical activity:    Days per week: Not on file    Minutes per session: Not on file  . Stress: Not on file  Relationships  . Social connections:    Talks on phone: Not on file    Gets together: Not on file    Attends religious service: Not on file    Active member of club or organization: Not on file    Attends meetings of clubs or organizations: Not on file    Relationship status: Not on file  . Intimate partner violence:    Fear of current or ex partner: Not on file    Emotionally abused: Not on file    Physically abused: Not on file    Forced sexual activity: Not on file  Other Topics Concern  . Not on file  Social History Narrative  . Not on file    FAMILY HISTORY:  Family History  Problem Relation Age of Onset  . COPD Mother   . Ovarian  cancer Mother 86  . Uterine cancer Mother 76  . Heart attack Father 35  . Diabetes Sister   . Heart disease Sister   . Kidney cancer Brother 39  . Breast cancer Cousin   . Kidney failure Sister   . Thyroid disease Sister   . Ovarian cancer Maternal Grandmother 61  . Cancer Paternal Aunt        NOS  . Cancer Paternal Uncle        NOS  . Colon cancer Other        2 distant cousins with colon cancer in their 41s    CURRENT MEDICATIONS:  Outpatient Encounter Medications as of 08/02/2018  Medication Sig Note  . ALPRAZolam (XANAX) 1 MG tablet Take 1 mg by mouth 4 (four) times daily as needed (for panic attacks.).    [name] Kitchen aspirin EC 81 MG tablet Take 81 mg by mouth at bedtime.   . calcium carbonate (TUMS - DOSED IN MG ELEMENTAL CALCIUM) 500 MG chewable tablet Chew 2 tablets by mouth daily.    . Cholecalciferol (VITAMIN D3) 2000 units TABS Take 2,000 Units by mouth daily.   . diclofenac sodium (VOLTAREN) 1 % GEL Apply 2-4 g topically 4 (four) times daily as needed (for knee/hip pain.).   [name] Kitchen fluticasone (FLONASE) 50 MCG/ACT nasal spray Place 1 spray into both nostrils daily. May repeat dose in evening if needed   . ipratropium (ATROVENT) 0.03 % nasal spray Place 1 spray into both nostrils daily.    Javier Docker Oil 350 MG CAPS Take 350 mg by mouth daily.   [name] Kitchen letrozole (FEMARA) 2.5 MG tablet Take 1 tablet (2.5 mg total) by mouth daily.   [name] Kitchen levofloxacin (LEVAQUIN) 500 MG tablet Take 500 mg by mouth daily. 02/28/2018: 21 day therapy patient to began on 02/18/2018  . levothyroxine (SYNTHROID, LEVOTHROID) 100 MCG tablet Take 100 mcg by mouth daily before breakfast.    . lidocaine-prilocaine (EMLA) cream APPLY A QUARTER SIZE AMOUNT TO PORT SITE 1 HOUR PRIOR TO CHEMO. DO NOT RUB IN. COVER WITH PLASTIC WRAP.   [name] Kitchen montelukast (SINGULAIR) 10 MG tablet Take 10 mg by mouth daily.   . ondansetron (ZOFRAN) 4 MG tablet Take 1 tablet (4 mg total) by mouth 2 (two) times daily as needed for nausea or vomiting.   .  ondansetron (ZOFRAN) 8 MG tablet TAKE 1 TABLET BY MOUTH EVERY 8 HOURS AS NEEDED FOR nausea OR vomiting   .  Oxycodone HCl 10 MG TABS Take 10 mg by mouth every 6 (six) hours as needed (for pain.).    [name] Kitchen polyethylene glycol powder (GLYCOLAX/MIRALAX) powder Take 17 g by mouth 2 (two) times daily.   . sertraline (ZOLOFT) 100 MG tablet Take 100 mg by mouth daily.   . simvastatin (ZOCOR) 20 MG tablet Take 20 mg by mouth at bedtime.    . traZODone (DESYREL) 50 MG tablet Take 100 mg by mouth at bedtime.   . [DISCONTINUED] calcium-vitamin D (OSCAL WITH D) 500-200 MG-UNIT tablet Take 1 tablet by mouth 2 (two) times daily.   . [DISCONTINUED] ondansetron (ZOFRAN) 4 MG tablet Take 4-8 mg by mouth every 6 (six) hours as needed for nausea or vomiting.     Facility-Administered Encounter Medications as of 08/02/2018  Medication  . sodium chloride flush (NS) 0.9 % injection 10 mL    ALLERGIES:  Allergies  Allergen Reactions  . Adhesive [Tape]     Some adhesives cause rash, redness, blister (not all) Patient tolerates PAPER TAPE  . Amoxicillin Hives    Has patient had a PCN reaction causing immediate rash, facial/tongue/throat swelling, SOB or lightheadedness with hypotension: No Has patient had a PCN reaction causing severe rash involving mucus membranes or skin necrosis: Yes Has patient had a PCN reaction that required hospitalization: No Has patient had a PCN reaction occurring within the last 10 years: Yes If all of the above answers are "NO", then may proceed with Cephalosporin use.   Carlton Adam [Propoxyphene N-Acetaminophen] Other (See Comments)    nightmares  . Doxycycline Hives  . Erythromycin Nausea And Vomiting    Severe GI issues  . Shellfish Allergy Nausea And Vomiting  . Zithromax [Azithromycin] Other (See Comments)    Vomiting, diarrhea  . Latex Rash     PHYSICAL EXAM:  ECOG Performance status: 1  Vitals:   08/02/18 1201  BP: 130/73  Pulse: (!) 50  Resp: 20  Temp: (!) 97.5 F  (36.4 C)  SpO2: 98%   Filed Weights   08/02/18 1201  Weight: 239 lb (108.4 kg)    Physical Exam  Constitutional: She is oriented to person, place, and time. She appears well-developed and well-nourished.  Musculoskeletal: Normal range of motion.  Neurological: She is alert and oriented to person, place, and time.  Skin: Skin is warm and dry.  Psychiatric: She has a normal mood and affect. Her behavior is normal. Judgment and thought content normal.  Breast exam did not reveal any palpable masses or lymphadenopathy.   LABORATORY DATA:  I have reviewed the labs as listed.  CBC    Component Value Date/Time   WBC 5.3 07/30/2018 1011   RBC 4.05 07/30/2018 1011   HGB 13.1 07/30/2018 1011   HCT 40.5 07/30/2018 1011   PLT 191 07/30/2018 1011   MCV 100.0 07/30/2018 1011   MCH 32.3 07/30/2018 1011   MCHC 32.3 07/30/2018 1011   RDW 13.0 07/30/2018 1011   LYMPHSABS 1.9 07/30/2018 1011   MONOABS 0.4 07/30/2018 1011   EOSABS 0.1 07/30/2018 1011   BASOSABS 0.0 07/30/2018 1011   CMP Latest Ref Rng & Units 07/30/2018 04/02/2018 10/04/2017  Glucose 70 - 99 mg/dL 101(H) 101(H) 73  BUN 6 - 20 mg/dL 16 16 24(H)  Creatinine 0.44 - 1.00 mg/dL 1.05(H) 0.96 0.88  Sodium 135 - 145 mmol/L 138 139 137  Potassium 3.5 - 5.1 mmol/L 4.7 4.5 4.1  Chloride 98 - 111 mmol/L 104 102 101  CO2 22 -  32 mmol/L _0 Calcium 8.9 - 10.3 mg/dL 9.9 10.5(H) 10.6(H)  Total Protein 6.5 - 8.1 g/dL 7.1 7.3 7.5  Total Bilirubin 0.3 - 1.2 mg/dL 0.3 0.8 0.5  Alkaline Phos 38 - 126 U/L 73 80 76  AST 15 - 41 U/L _1 ALT 0 - 44 U/L _2 DIAGNOSTIC IMAGING:  I have reviewed her mammogram dated 07/30/2018 and discussed with the patient.    ASSESSMENT & PLAN:   Breast cancer of upper-outer quadrant of left female breast ([place]) 1.  T1c N0 left breast cancer, ER positive, PR/HER-2 negative: - Status post lumpectomy on 08/24/2016, pathology showing 1.9 cm, grade 3, 0 out of 4 lymph nodes positive,  margins negative, Ki-67 of 70% -Oncotype DX recurrence score of 30, BCI-high risk, INVITAE test on 11/14/2017 negative for hereditary breast cancer. -4 cycles of adjuvant TC from 09/25/2016 through 12/05/2016 - Anastrozole started in February 2018, switched to letrozole in February 2019 secondary to hair thinning -Patient reports that she started experiencing hair thinning for the last 1 week on letrozole.  Today examination is within normal limits. - Mammogram reviewed by me on 07/30/2018 shows BI-RADS Category 2.  Blood work was within normal limits. -She will come back in 4 months for repeat breast exam and blood work.  2.  Bone density: -DEXA scan on 02/14/2017 shows T score of -0.2. -She was told to continue taking vitamin D daily.  She also takes Tums thousand milligrams daily as she is allergic to calcium from shells.      Orders placed this encounter:  Orders Placed This Encounter  Procedures  . Lactate dehydrogenase  . CBC with Differential/Platelet  . Comprehensive metabolic panel      Derek Jack, MD [place] [phone]

## 2018-08-02 NOTE — Patient Instructions (Signed)
[facility] Discharge Instructions  Follow up in 4 months with labs prior to your visit.    Thank you for choosing [facility] at [facility] to provide your oncology and hematology care.  To afford each patient quality time with our provider, please arrive at least 15 minutes before your scheduled appointment time.   If you have a lab appointment with the Cancer Center please come in thru the  Main Entrance and check in at the main information desk  You need to re-schedule your appointment should you arrive 10 or more minutes late.  We strive to give you quality time with our providers, and arriving late affects you and other patients whose appointments are after yours.  Also, if you no show three or more times for appointments you may be dismissed from the clinic at the providers discretion.     Again, thank you for choosing [facility].  Our hope is that these requests will decrease the amount of time that you wait before being seen by our physicians.       _____________________________________________________________  Should you have questions after your visit to  Cancer Center, please contact our office at [phone] between the hours of 8:00 a.m. and 4:30 p.m.  Voicemails left after 4:00 p.m. will not be returned until the following business day.  For prescription refill requests, have your pharmacy contact our office and allow 72 hours.    Cancer Center Support Programs:   > Cancer Support Group  2nd Tuesday of the month 1pm-2pm, Journey Room    

## 2018-08-02 NOTE — Assessment & Plan Note (Signed)
1.  T1c N0 left breast cancer, ER positive, PR/HER-2 negative: - Status post lumpectomy on 08/24/2016, pathology showing 1.9 cm, grade 3, 0 out of 4 lymph nodes positive, margins negative, Ki-67 of 70% -Oncotype DX recurrence score of 30, BCI-high risk, INVITAE test on 11/14/2017 negative for hereditary breast cancer. -4 cycles of adjuvant TC from 09/25/2016 through 12/05/2016 - Anastrozole started in February 2018, switched to letrozole in February 2019 secondary to hair thinning -Patient reports that she started experiencing hair thinning for the last 1 week on letrozole.  Today examination is within normal limits. - Mammogram reviewed by me on 07/30/2018 shows BI-RADS Category 2.  Blood work was within normal limits. -She will come back in 4 months for repeat breast exam and blood work.  2.  Bone density: -DEXA scan on 02/14/2017 shows T score of -0.2. -She was told to continue taking vitamin D daily.  She also takes Tums thousand milligrams daily as she is allergic to calcium from shells.

## 2018-08-05 DIAGNOSIS — Z6841 Body Mass Index (BMI) 40.0 and over, adult: Secondary | ICD-10-CM | POA: Diagnosis not present

## 2018-08-05 DIAGNOSIS — Z124 Encounter for screening for malignant neoplasm of cervix: Secondary | ICD-10-CM | POA: Diagnosis not present

## 2018-08-05 DIAGNOSIS — Z01419 Encounter for gynecological examination (general) (routine) without abnormal findings: Secondary | ICD-10-CM | POA: Diagnosis not present

## 2018-08-13 DIAGNOSIS — M47816 Spondylosis without myelopathy or radiculopathy, lumbar region: Secondary | ICD-10-CM | POA: Diagnosis not present

## 2018-08-13 DIAGNOSIS — G894 Chronic pain syndrome: Secondary | ICD-10-CM | POA: Diagnosis not present

## 2018-08-13 DIAGNOSIS — M5416 Radiculopathy, lumbar region: Secondary | ICD-10-CM | POA: Diagnosis not present

## 2018-08-13 DIAGNOSIS — M47812 Spondylosis without myelopathy or radiculopathy, cervical region: Secondary | ICD-10-CM | POA: Diagnosis not present

## 2018-08-28 DIAGNOSIS — Z170 Estrogen receptor positive status [ER+]: Secondary | ICD-10-CM | POA: Diagnosis not present

## 2018-08-28 DIAGNOSIS — Z510 Encounter for antineoplastic radiation therapy: Secondary | ICD-10-CM | POA: Diagnosis not present

## 2018-08-28 DIAGNOSIS — C50412 Malignant neoplasm of upper-outer quadrant of left female breast: Secondary | ICD-10-CM | POA: Diagnosis not present

## 2018-09-12 ENCOUNTER — Ambulatory Visit: Payer: Medicare Other | Admitting: Cardiovascular Disease

## 2018-09-20 NOTE — Progress Notes (Signed)
Cardiology Office Note   Date:  09/23/2018   ID:  Tracy Valentine, Tracy Valentine 09/30/1956, MRN [id number]  PCP:  Sharilyn Sites, MD  Cardiologist:   No primary care provider on file. Referring:  Margaretha Sheffield, MD  Chief Complaint  Patient presents with  . Bradycardia      History of Present Illness: Tracy Valentine is a 62 y.o. female who is referred by Margaretha Sheffield, MD for evaluation of low hear rate.  .  She previously saw Dr. Bronson Ing in 2014 with chest pain.   She had a normal echocardiogram.   No other work up was suggested.  She is not noticed any change but has been told of the past year that her heart rates in the 40s.  She said it used to be in the 37s and 60s.  She does not have any presyncope or syncope.  She still does her chores which include vacuuming and cleaning house.  She gets tired but she does not have any presyncope.  She is having shortness of breath.  She has a vague occasional chest pressure but no reproducible symptoms with exertion.  This is not new.  This is been chronic.  She denies any neck or arm discomfort.  She is had chronic fatigue for the last couple of years since she started chemotherapy for breast cancer.   Past Medical History:  Diagnosis Date  . Anxiety   . Breast cancer (HCC)    breast  . Chronic pain   . DDD (degenerative disc disease), cervical   . DDD (degenerative disc disease), lumbar   . Degenerative disc disease at L5-S1 level   . Depression   . GERD (gastroesophageal reflux disease)   . History of kidney stones   . Hyperlipidemia   . Hypothyroidism   . Obesity   . Osteoarthritis   . Personal history of chemotherapy   . PONV (postoperative nausea and vomiting)   . Sleep apnea    uses CPAP  . Spinal stenosis     Past Surgical History:  Procedure Laterality Date  . ABDOMINAL HYSTERECTOMY     partial hyst, still have ovaries  . BREAST LUMPECTOMY Left 08/24/2016  . BREAST LUMPECTOMY WITH RADIOACTIVE SEED AND SENTINEL  LYMPH NODE BIOPSY Left 08/24/2016   Procedure: LEFT BREAST PARTIAL MASTECTOMY WITH RADIOACTIVE SEED AND LEFT SENTINEL LYMPH NODE MAPPING;  Surgeon: Erroll Luna, MD;  Location: [facility];  Service: General;  Laterality: Left;  . CHOLECYSTECTOMY    . FOOT FOREIGN BODY REMOVAL Left   . PORT-A-CATH REMOVAL N/A 03/06/2018   Procedure: MINOR REMOVAL PORT-A-CATH;  Surgeon: Aviva Signs, MD;  Location: [facility];  Service: General;  Laterality: N/A;  . PORTACATH PLACEMENT Right 09/29/2016   Procedure: INSERTION PORT-A-CATH RIGHT SUBCLAVIAN;  Surgeon: Aviva Signs, MD;  Location: [facility];  Service: General;  Laterality: Right;     Current Outpatient Medications  Medication Sig Dispense Refill  . ALPRAZolam (XANAX) 1 MG tablet Take 1 mg by mouth 4 (four) times daily as needed (for panic attacks.).     [name] Kitchen aspirin EC 81 MG tablet Take 81 mg by mouth at bedtime.    . calcium carbonate (TUMS - DOSED IN MG ELEMENTAL CALCIUM) 500 MG chewable tablet Chew 2 tablets by mouth daily.     . Cholecalciferol (VITAMIN D3) 2000 units TABS Take 2,000 Units by mouth daily.    . fluticasone (FLONASE) 50 MCG/ACT nasal spray Place 1 spray into both nostrils daily.  May repeat dose in evening if needed    . ipratropium (ATROVENT) 0.03 % nasal spray Place 1 spray into both nostrils daily.   4  . Krill Oil 350 MG CAPS Take 350 mg by mouth daily.    [name] Kitchen letrozole (FEMARA) 2.5 MG tablet Take 1 tablet (2.5 mg total) by mouth daily. 90 tablet 3  . levofloxacin (LEVAQUIN) 500 MG tablet Take 500 mg by mouth daily.  0  . levothyroxine (SYNTHROID, LEVOTHROID) 100 MCG tablet Take 100 mcg by mouth daily before breakfast.   5  . montelukast (SINGULAIR) 10 MG tablet Take 10 mg by mouth daily.  11  . ondansetron (ZOFRAN) 4 MG tablet Take 1 tablet (4 mg total) by mouth 2 (two) times daily as needed for nausea or vomiting. 30 tablet 3  . Oxycodone HCl 10 MG TABS Take 10 mg by mouth every 6 (six) hours as needed (for pain.).   0    . polyethylene glycol powder (GLYCOLAX/MIRALAX) powder Take 17 g by mouth 2 (two) times daily.    . sertraline (ZOLOFT) 100 MG tablet Take 100 mg by mouth daily.  0  . simvastatin (ZOCOR) 20 MG tablet Take 20 mg by mouth at bedtime.     . traZODone (DESYREL) 50 MG tablet Take 100 mg by mouth at bedtime.    . diclofenac sodium (VOLTAREN) 1 % GEL Apply 2-4 g topically 4 (four) times daily as needed (for knee/hip pain.).     No current facility-administered medications for this visit.    Facility-Administered Medications Ordered in Other Visits  Medication Dose Route Frequency Provider Last Rate Last Dose  . sodium chloride flush (NS) 0.9 % injection 10 mL  10 mL Intravenous PRN Holley Bouche, NP   10 mL at 10/04/17 1111    Allergies:   Adhesive [tape]; Amoxicillin; Darvocet [propoxyphene n-acetaminophen]; Doxycycline; Erythromycin; Shellfish allergy; Zithromax [azithromycin]; and Latex    Social History:  The patient  reports that she has never smoked. She has never used smokeless tobacco. She reports that she does not drink alcohol or use drugs.   Family History:  The patient's family history includes Breast cancer in her cousin; COPD in her mother; Cancer in her paternal aunt and paternal uncle; Colon cancer in her other; Diabetes in her sister; Heart attack (age of onset: 44) in her father; Heart disease in her sister; Kidney cancer (age of onset: 18) in her brother; Kidney failure in her sister; Ovarian cancer (age of onset: 32) in her mother; Ovarian cancer (age of onset: 69) in her maternal grandmother; Thyroid disease in her sister; Uterine cancer (age of onset: 58) in her mother.    ROS:  Please see the history of present illness.   Otherwise, review of systems are positive for back pain.   All other systems are reviewed and negative.    PHYSICAL EXAM: VS:  BP 120/70   Pulse (!) 57   Ht 5\' 8"  (1.727 m)   Wt 244 lb (110.7 kg)   BMI 37.10 kg/m  , BMI Body mass index is 37.1  kg/m. GENERAL:  Well appearing HEENT:  Pupils equal round and reactive, fundi not visualized, oral mucosa unremarkable NECK:  No jugular venous distention, waveform within normal limits, carotid upstroke brisk and symmetric, no bruits, no thyromegaly LYMPHATICS:  No cervical, inguinal adenopathy LUNGS:  Clear to auscultation bilaterally BACK:  No CVA tenderness CHEST:  Unremarkable HEART:  PMI not displaced or sustained,S1 and S2 within normal limits, no  S3, no S4, no clicks, no rubs, no murmurs ABD:  Flat, positive bowel sounds normal in frequency in pitch, no bruits, no rebound, no guarding, no midline pulsatile mass, no hepatomegaly, no splenomegaly EXT:  2 plus pulses throughout, no edema, no cyanosis no clubbing SKIN:  No rashes no nodules NEURO:  Cranial nerves II through XII grossly intact, motor grossly intact throughout PSYCH:  Cognitively intact, oriented to person place and time    EKG:  EKG is ordered today. The ekg ordered today demonstrates sinus rhythm, rate 57, axis within normal limits, intervals within normal limits, borderline low voltage, nonspecific T wave flattening.    Recent Labs: 07/30/2018: ALT 17; BUN 16; Creatinine, Ser 1.05; Hemoglobin 13.1; Platelets 191; Potassium 4.7; Sodium 138    Lipid Panel No results found for: CHOL, TRIG, HDL, CHOLHDL, VLDL, LDLCALC, LDLDIRECT    Wt Readings from Last 3 Encounters:  09/23/18 244 lb (110.7 kg)  08/02/18 239 lb (108.4 kg)  04/04/18 236 lb (107 kg)      Other studies Reviewed: Additional studies/ records that were reviewed today include: Labs. Review of the above records demonstrates:  Please see elsewhere in the note.     ASSESSMENT AND PLAN:  BRADYCARDIA: The patient's heart rate did go up from the 50s to the 80s with ambulation around the office.  She felt fine.  She has had normal TSH.  This however was a year ago.  She is going to have this repeated.  I doubt that she will need any other therapy.  I  will check a 24-hour Holter.  OBESITY:  The patient understands the need to lose weight with diet and exercise. We have discussed specific strategies for this.  We talked about specific strategies.  She is not diabetic.  Her lipids are very good.  Her blood pressure is controlled.  She is going to work on diet and exercise.   Current medicines are reviewed at length with the patient today.  The patient does not have concerns regarding medicines.  The following changes have been made:  no change  Labs/ tests ordered today include:   Orders Placed This Encounter  Procedures  . HOLTER MONITOR - 24 HOUR  . EKG 12-Lead     Disposition:   FU with me as needed.     Signed, Minus Breeding, MD  09/23/2018 1:59 PM    [facility]

## 2018-09-23 ENCOUNTER — Ambulatory Visit: Payer: Medicare Other | Admitting: Cardiology

## 2018-09-23 ENCOUNTER — Encounter: Payer: Self-pay | Admitting: Cardiology

## 2018-09-23 VITALS — BP 120/70 | HR 57 | Ht 68.0 in | Wt 244.0 lb

## 2018-09-23 DIAGNOSIS — R001 Bradycardia, unspecified: Secondary | ICD-10-CM | POA: Insufficient documentation

## 2018-09-23 NOTE — Patient Instructions (Signed)
Medication Instructions:  Continue current medication  If you need a refill on your cardiac medications before your next appointment, please call your pharmacy.  Labwork: None Ordered   If you have labs (blood work) drawn today and your tests are completely normal, you will receive your results only by: [name] Kitchen MyChart Message (if you have MyChart) OR . A paper copy in the mail If you have any lab test that is abnormal or we need to change your treatment, we will call you to review the results.  Testing/Procedures: Your physician has recommended that you wear a 24 hour holter monitor. Holter monitors are medical devices that record the heart's electrical activity. Doctors most often use these monitors to diagnose arrhythmias. Arrhythmias are problems with the speed or rhythm of the heartbeat. The monitor is a small, portable device. You can wear one while you do your normal daily activities. This is usually used to diagnose what is causing palpitations/syncope (passing out).   Follow-Up: You will need a follow up appointment in As Needed.   At [facility], you and your health needs are our priority.  As part of our continuing mission to provide you with exceptional heart care, we have created designated Provider Care Teams.  These Care Teams include your primary Cardiologist (physician) and Advanced Practice Providers (APPs -  Physician Assistants and Nurse Practitioners) who all work together to provide you with the care you need, when you need it.   Thank you for choosing [facility] at [facility]!!

## 2018-10-01 DIAGNOSIS — Z681 Body mass index (BMI) 19 or less, adult: Secondary | ICD-10-CM | POA: Diagnosis not present

## 2018-10-01 DIAGNOSIS — M159 Polyosteoarthritis, unspecified: Secondary | ICD-10-CM | POA: Diagnosis not present

## 2018-10-01 DIAGNOSIS — Z0001 Encounter for general adult medical examination with abnormal findings: Secondary | ICD-10-CM | POA: Diagnosis not present

## 2018-10-01 DIAGNOSIS — R7309 Other abnormal glucose: Secondary | ICD-10-CM | POA: Diagnosis not present

## 2018-10-01 DIAGNOSIS — Z1389 Encounter for screening for other disorder: Secondary | ICD-10-CM | POA: Diagnosis not present

## 2018-10-01 DIAGNOSIS — G4730 Sleep apnea, unspecified: Secondary | ICD-10-CM | POA: Diagnosis not present

## 2018-10-09 ENCOUNTER — Ambulatory Visit (INDEPENDENT_AMBULATORY_CARE_PROVIDER_SITE_OTHER): Payer: Medicare Other

## 2018-10-09 DIAGNOSIS — G894 Chronic pain syndrome: Secondary | ICD-10-CM | POA: Diagnosis not present

## 2018-10-09 DIAGNOSIS — R001 Bradycardia, unspecified: Secondary | ICD-10-CM

## 2018-10-09 DIAGNOSIS — M47816 Spondylosis without myelopathy or radiculopathy, lumbar region: Secondary | ICD-10-CM | POA: Diagnosis not present

## 2018-10-09 DIAGNOSIS — M5416 Radiculopathy, lumbar region: Secondary | ICD-10-CM | POA: Diagnosis not present

## 2018-10-09 DIAGNOSIS — M47812 Spondylosis without myelopathy or radiculopathy, cervical region: Secondary | ICD-10-CM | POA: Diagnosis not present

## 2018-10-14 DIAGNOSIS — M4722 Other spondylosis with radiculopathy, cervical region: Secondary | ICD-10-CM | POA: Diagnosis not present

## 2018-10-14 DIAGNOSIS — M4802 Spinal stenosis, cervical region: Secondary | ICD-10-CM | POA: Diagnosis not present

## 2018-10-25 ENCOUNTER — Telehealth: Payer: Self-pay | Admitting: Cardiology

## 2018-10-25 NOTE — Telephone Encounter (Signed)
New Message        Patient is calling checking the status of the heart monitor results. Pls call back and advise.

## 2018-10-25 NOTE — Telephone Encounter (Signed)
Pt aware of her monitor result 

## 2018-11-18 DIAGNOSIS — J324 Chronic pansinusitis: Secondary | ICD-10-CM | POA: Diagnosis not present

## 2018-11-26 ENCOUNTER — Other Ambulatory Visit (HOSPITAL_COMMUNITY): Payer: Medicare Other

## 2018-12-02 ENCOUNTER — Inpatient Hospital Stay (HOSPITAL_COMMUNITY): Payer: Medicare Other | Attending: Nurse Practitioner

## 2018-12-02 DIAGNOSIS — C50412 Malignant neoplasm of upper-outer quadrant of left female breast: Secondary | ICD-10-CM

## 2018-12-02 DIAGNOSIS — Z170 Estrogen receptor positive status [ER+]: Secondary | ICD-10-CM | POA: Insufficient documentation

## 2018-12-02 LAB — COMPREHENSIVE METABOLIC PANEL
ALK PHOS: 64 U/L (ref 38–126)
ALT: 15 U/L (ref 0–44)
AST: 14 U/L — AB (ref 15–41)
Albumin: 3.8 g/dL (ref 3.5–5.0)
Anion gap: 7 (ref 5–15)
BILIRUBIN TOTAL: 0.6 mg/dL (ref 0.3–1.2)
BUN: 23 mg/dL — AB (ref 6–20)
CALCIUM: 9.7 mg/dL (ref 8.9–10.3)
CO2: 27 mmol/L (ref 22–32)
CREATININE: 1 mg/dL (ref 0.44–1.00)
Chloride: 106 mmol/L (ref 98–111)
GFR calc Af Amer: >60 mL/min (ref >60)
Glucose, Bld: 87 mg/dL (ref 70–99)
Potassium: 4 mmol/L (ref 3.5–5.1)
Sodium: 140 mmol/L (ref 135–145)
TOTAL PROTEIN: 6.9 g/dL (ref 6.5–8.1)

## 2018-12-02 LAB — CBC WITH DIFFERENTIAL/PLATELET
Abs Immature Granulocytes: 0.03 10*3/uL (ref 0.00–0.07)
Basophils Absolute: 0 10*3/uL (ref 0.0–0.1)
Basophils Relative: 1 %
EOS ABS: 0.1 10*3/uL (ref 0.0–0.5)
Eosinophils Relative: 2 %
HEMATOCRIT: 43.3 % (ref 36.0–46.0)
HEMOGLOBIN: 13.8 g/dL (ref 12.0–15.0)
IMMATURE GRANULOCYTES: 1 %
LYMPHS ABS: 1.9 10*3/uL (ref 0.7–4.0)
LYMPHS PCT: 29 %
MCH: 32.4 pg (ref 26.0–34.0)
MCHC: 31.9 g/dL (ref 30.0–36.0)
MCV: 101.6 fL — AB (ref 80.0–100.0)
MONO ABS: 0.5 10*3/uL (ref 0.1–1.0)
MONOS PCT: 7 %
NRBC: 0 % (ref 0.0–0.2)
Neutro Abs: 4.1 10*3/uL (ref 1.7–7.7)
Neutrophils Relative %: 60 %
Platelets: 213 10*3/uL (ref 150–400)
RBC: 4.26 MIL/uL (ref 3.87–5.11)
RDW: 12.7 % (ref 11.5–15.5)
WBC: 6.6 10*3/uL (ref 4.0–10.5)

## 2018-12-02 LAB — LACTATE DEHYDROGENASE: LDH: 130 U/L (ref 98–192)

## 2018-12-03 ENCOUNTER — Inpatient Hospital Stay (HOSPITAL_COMMUNITY): Payer: Medicare Other | Attending: Hematology | Admitting: Hematology

## 2018-12-03 ENCOUNTER — Other Ambulatory Visit: Payer: Self-pay

## 2018-12-03 ENCOUNTER — Encounter (HOSPITAL_COMMUNITY): Payer: Self-pay | Admitting: Hematology

## 2018-12-03 VITALS — BP 137/68 | HR 53 | Temp 98.2°F | Resp 18 | Wt 245.6 lb

## 2018-12-03 DIAGNOSIS — M47812 Spondylosis without myelopathy or radiculopathy, cervical region: Secondary | ICD-10-CM | POA: Diagnosis not present

## 2018-12-03 DIAGNOSIS — Z791 Long term (current) use of non-steroidal anti-inflammatories (NSAID): Secondary | ICD-10-CM | POA: Diagnosis not present

## 2018-12-03 DIAGNOSIS — Z9221 Personal history of antineoplastic chemotherapy: Secondary | ICD-10-CM

## 2018-12-03 DIAGNOSIS — Z7982 Long term (current) use of aspirin: Secondary | ICD-10-CM

## 2018-12-03 DIAGNOSIS — Z79899 Other long term (current) drug therapy: Secondary | ICD-10-CM | POA: Diagnosis not present

## 2018-12-03 DIAGNOSIS — R110 Nausea: Secondary | ICD-10-CM

## 2018-12-03 DIAGNOSIS — M5416 Radiculopathy, lumbar region: Secondary | ICD-10-CM | POA: Diagnosis not present

## 2018-12-03 DIAGNOSIS — Z79811 Long term (current) use of aromatase inhibitors: Secondary | ICD-10-CM | POA: Diagnosis not present

## 2018-12-03 DIAGNOSIS — M47816 Spondylosis without myelopathy or radiculopathy, lumbar region: Secondary | ICD-10-CM | POA: Diagnosis not present

## 2018-12-03 DIAGNOSIS — G894 Chronic pain syndrome: Secondary | ICD-10-CM | POA: Diagnosis not present

## 2018-12-03 DIAGNOSIS — Z170 Estrogen receptor positive status [ER+]: Secondary | ICD-10-CM

## 2018-12-03 DIAGNOSIS — C50412 Malignant neoplasm of upper-outer quadrant of left female breast: Secondary | ICD-10-CM | POA: Insufficient documentation

## 2018-12-03 DIAGNOSIS — E039 Hypothyroidism, unspecified: Secondary | ICD-10-CM | POA: Insufficient documentation

## 2018-12-03 DIAGNOSIS — Z923 Personal history of irradiation: Secondary | ICD-10-CM | POA: Diagnosis not present

## 2018-12-03 MED ORDER — ONDANSETRON HCL 4 MG PO TABS: 4.0000 mg | ORAL_TABLET | Freq: Two times a day (BID) | ORAL | 3 refills | Status: DC | PRN

## 2018-12-03 NOTE — Patient Instructions (Signed)
[facility] at [facility] Discharge Instructions     Thank you for choosing [facility] at [facility] to provide your oncology and hematology care.  To afford each patient quality time with our provider, please arrive at least 15 minutes before your scheduled appointment time.   If you have a lab appointment with the Cancer Center please come in thru the  Main Entrance and check in at the main information desk  You need to re-schedule your appointment should you arrive 10 or more minutes late.  We strive to give you quality time with our providers, and arriving late affects you and other patients whose appointments are after yours.  Also, if you no show three or more times for appointments you may be dismissed from the clinic at the providers discretion.     Again, thank you for choosing [facility].  Our hope is that these requests will decrease the amount of time that you wait before being seen by our physicians.       _____________________________________________________________  Should you have questions after your visit to [facility], please contact our office at [phone] between the hours of 8:00 a.m. and 4:30 p.m.  Voicemails left after 4:00 p.m. will not be returned until the following business day.  For prescription refill requests, have your pharmacy contact our office and allow 72 hours.    Cancer Center Support Programs:   > Cancer Support Group  2nd Tuesday of the month 1pm-2pm, Journey Room    

## 2018-12-03 NOTE — Assessment & Plan Note (Signed)
1.  T1c N0 left breast cancer, ER positive, PR/HER-2 negative: - Status post lumpectomy on 08/24/2016, pathology showing 1.9 cm, grade 3, 0 out of 4 lymph nodes positive, margins negative, Ki-67 of 70% -Oncotype DX recurrence score of 30, BCI-high risk, INVITAE test on 11/14/2017 negative for hereditary breast cancer. -4 cycles of adjuvant TC from 09/25/2016 through 12/05/2016 - Anastrozole started in February 2018, switched to letrozole in February 2019 secondary to hair thinning.  -Mammogram on 07/30/2018 shows BI-RADS Category 2. - She is continuing to have issues with hair thinning.  However would like to continue letrozole at this time.  She is also experiencing some nausea on and off.  No vomiting reported. - We will refill Zofran for her nausea.  I have suggested her to take letrozole at bedtime. -Physical examination today did not reveal any suspicious masses. -We will see her back in 6 months for follow-up with repeat blood work.  We will schedule her mammogram in October.   2.  Bone density: -DEXA scan on 02/14/2017 shows T score of -0.2. -She was counseled to take vitamin D and calcium.  She takes Tums thousand milligrams daily as she is allergic to calcium from shells. -We will plan to repeat DEXA scan in 3 years from the last one.

## 2018-12-03 NOTE — Progress Notes (Signed)
[place], [place] [address]   CLINIC:  Medical Oncology/Hematology  PCP:  Sharilyn Sites, [place] [address] [phone]   REASON FOR VISIT: Follow-up for left breast cancer  CURRENT THERAPY: Arimidex   BRIEF ONCOLOGIC HISTORY:    Breast cancer of upper-outer quadrant of left female breast ([place])   07/26/2016 Mammogram    Area of developing asymmetry with distortion located within the upper-outer quadrant left breast. Tissue sampling via tomosynthesis guided biopsy is recommended and will be scheduled.  RECOMMENDATION: Left breast tomosynthesis guided biopsy. This has been scheduled for 07/28/2016.     07/28/2016 Initial Biopsy    Coil shaped clip corresponds to the asymmetry in the outer left breast. Clip is well positioned at the biopsy site. 2. X shaped clip corresponds to the ASYMMETRY/DISTORTION in the upper-outer quadrant of the left breast. Clip is positioned approximately 1 cm medial to the center of the biopsy cavity    07/28/2016 Pathology Results    Estrogen Receptor: 80%, POSITIVE, MODERATE STAINING INTENSITY Progesterone Receptor: 0%, NEGATIVE Proliferation Marker Ki67: 70% HER 2 negative by FISH Breast, left, needle core biopsy, upper outer quadrant - INVASIVE DUCTAL CARCINOMA, SEE COMMENT. - HIGH GRADE DUCTAL CARCINOMA IN SITU WITH NECROSIS.    08/24/2016 Oncotype testing    Recurrence Score Result 30, 10 year risk of distant recurrence Tamoxifen alone 20%    10/03/2016 - 12/05/2016 Chemotherapy    The patient had palonosetron (ALOXI) injection 0.25 mg, 0.25 mg, Intravenous,  Once, 2 of 4 cycles Administration: 0.25 mg (10/24/2016)  pegfilgrastim (NEULASTA ONPRO KIT) injection 6 mg, 6 mg, Subcutaneous, Once, 2 of 4 cycles Administration: 6 mg (10/24/2016)  cyclophosphamide (CYTOXAN) 1,380 mg in sodium chloride 0.9 % 250 mL chemo infusion, 600 mg/m2 = 1,380 mg, Intravenous,  Once, 2 of 4  cycles Administration: 1,380 mg (10/24/2016)  DOCEtaxel (TAXOTERE) 170 mg in dextrose 5 % 250 mL chemo infusion, 75 mg/m2 = 170 mg, Intravenous,  Once, 2 of 4 cycles Dose modification: 65 mg/m2 (original dose 75 mg/m2, Cycle 2, Reason: Dose not tolerated)  fosaprepitant (EMEND) 150 mg, dexamethasone (DECADRON) 12 mg in sodium chloride 0.9 % 145 mL IVPB, , Intravenous,  Once, 1 of 3 cycles Administration:  (10/24/2016)  for chemotherapy treatment.       - 02/06/2017 Radiation Therapy    Completed adjuvant breast RT     02/26/2017 -  Anti-estrogen oral therapy    Started anastrozole    03/05/2017 Pathology Results    BCI-high risk of late recurrence (years 5-10) at 10.6% with a high likelihood of benefit from extended endocrine therapy.  High overall risk of recurrence (years 0-10) 19.5% and a high likelihood of benefit from extended endocrine therapy.    11/14/2017 Genetic Testing    Negative genetic testing on the Multi-cancer panel.  The Multi-Gene Panel offered by [facility] includes sequencing and/or deletion duplication testing of the following 83 genes: ALK, APC, ATM, AXIN2,BAP1,  BARD1, BLM, BMPR1A, BRCA1, BRCA2, BRIP1, CASR, CDC73, CDH1, CDK4, CDKN1B, CDKN1C, CDKN2A (p14ARF), CDKN2A (p16INK4a), CEBPA, CHEK2, CTNNA1, DICER1, DIS3L2, EGFR (c.2369C>T, p.Thr790Met variant only), EPCAM (Deletion/duplication testing only), FH, FLCN, GATA2, GPC3, GREM1 (Promoter region deletion/duplication testing only), HOXB13 (c.251G>A, p.Gly84Glu), HRAS, KIT, MAX, MEN1, MET, MITF (c.952G>A, p.Glu318Lys variant only), MLH1, MSH2, MSH3, MSH6, MUTYH, NBN, NF1, NF2, NTHL1, PALB2, PDGFRA, PHOX2B, PMS2, POLD1, POLE, POT1, PRKAR1A, PTCH1, PTEN, RAD50, RAD51C, RAD51D, RB1, RECQL4, RET, RUNX1, SDHAF2, SDHA (sequence changes only), SDHB, SDHC, SDHD,  SMAD4, SMARCA4, SMARCB1, SMARCE1, STK11, SUFU, TERT, TERT, TMEM127, TP53, TSC1, TSC2, VHL, WRN and WT1. The report date is November 14, 2017.       CANCER STAGING: Cancer  Staging Breast cancer of upper-outer quadrant of left female breast ([place]) Staging form: Breast, AJCC 7th Edition - Pathologic: Stage IA (T1c, N0, cM0) - Unsigned - Pathologic: No stage assigned - Unsigned    INTERVAL HISTORY:  Ms. Custer 63 y.o. female returns for routine follow-up for left breast cancer. She is doing well since her last visit. She still reports thinning of her hair. She has intermittent nausea and uses zofran as needed. Denies any nausea, vomiting, or diarrhea. Denies any new pains. Had not noticed any recent bleeding such as epistaxis, hematuria or hematochezia. Denies recent chest pain on exertion, shortness of breath on minimal exertion, pre-syncopal episodes, or palpitations. Denies any numbness or tingling in hands or feet. Denies any recent fevers, infections, or recent hospitalizations. Patient reports appetite at 100% and energy level at 75%.     REVIEW OF SYSTEMS:  Review of Systems  Gastrointestinal: Positive for nausea.  All other systems reviewed and are negative.    PAST MEDICAL/SURGICAL HISTORY:  Past Medical History:  Diagnosis Date  . Anxiety   . Breast cancer (HCC)    breast  . Chronic pain   . DDD (degenerative disc disease), cervical   . DDD (degenerative disc disease), lumbar   . Degenerative disc disease at L5-S1 level   . Depression   . GERD (gastroesophageal reflux disease)   . History of kidney stones   . Hyperlipidemia   . Hypothyroidism   . Obesity   . Osteoarthritis   . Personal history of chemotherapy   . PONV (postoperative nausea and vomiting)   . Sleep apnea    uses CPAP  . Spinal stenosis    Past Surgical History:  Procedure Laterality Date  . ABDOMINAL HYSTERECTOMY     partial hyst, still have ovaries  . BREAST LUMPECTOMY Left 08/24/2016  . BREAST LUMPECTOMY WITH RADIOACTIVE SEED AND SENTINEL LYMPH NODE BIOPSY Left 08/24/2016   Procedure: LEFT BREAST PARTIAL MASTECTOMY WITH RADIOACTIVE SEED AND LEFT SENTINEL LYMPH  NODE MAPPING;  Surgeon: Erroll Luna, MD;  Location: [facility];  Service: General;  Laterality: Left;  . CHOLECYSTECTOMY    . FOOT FOREIGN BODY REMOVAL Left   . PORT-A-CATH REMOVAL N/A 03/06/2018   Procedure: MINOR REMOVAL PORT-A-CATH;  Surgeon: Aviva Signs, MD;  Location: [facility];  Service: General;  Laterality: N/A;  . PORTACATH PLACEMENT Right 09/29/2016   Procedure: INSERTION PORT-A-CATH RIGHT SUBCLAVIAN;  Surgeon: Aviva Signs, MD;  Location: [facility];  Service: General;  Laterality: Right;     SOCIAL HISTORY:  Social History   Socioeconomic History  . Marital status: Married    Spouse name: Not on file  . Number of children: Not on file  . Years of education: Not on file  . Highest education level: Not on file  Occupational History  . Not on file  Social Needs  . Financial resource strain: Not on file  . Food insecurity:    Worry: Not on file    Inability: Not on file  . Transportation needs:    Medical: Not on file    Non-medical: Not on file  Tobacco Use  . Smoking status: Never Smoker  . Smokeless tobacco: Never Used  Substance and Sexual Activity  . Alcohol use: No  . Drug use: No  . Sexual activity:  Yes    Birth control/protection: None    Comment: married-1 grown son  Lifestyle  . Physical activity:    Days per week: Not on file    Minutes per session: Not on file  . Stress: Not on file  Relationships  . Social connections:    Talks on phone: Not on file    Gets together: Not on file    Attends religious service: Not on file    Active member of club or organization: Not on file    Attends meetings of clubs or organizations: Not on file    Relationship status: Not on file  . Intimate partner violence:    Fear of current or ex partner: Not on file    Emotionally abused: Not on file    Physically abused: Not on file    Forced sexual activity: Not on file  Other Topics Concern  . Not on file  Social History Narrative   Lives with  husband.  One child.      FAMILY HISTORY:  Family History  Problem Relation Age of Onset  . COPD Mother   . Ovarian cancer Mother 18  . Uterine cancer Mother 59  . Heart attack Father 49  . Diabetes Sister   . Heart disease Sister   . Kidney cancer Brother 26  . Breast cancer Cousin   . Kidney failure Sister   . Thyroid disease Sister   . Ovarian cancer Maternal Grandmother 93  . Cancer Paternal Aunt        NOS  . Cancer Paternal Uncle        NOS  . Colon cancer Other        2 distant cousins with colon cancer in their 16s    CURRENT MEDICATIONS:  Outpatient Encounter Medications as of 12/03/2018  Medication Sig Note  . aspirin EC 81 MG tablet Take 81 mg by mouth at bedtime.   . calcium carbonate (TUMS - DOSED IN MG ELEMENTAL CALCIUM) 500 MG chewable tablet Chew 2 tablets by mouth daily.    . Cholecalciferol (VITAMIN D3) 2000 units TABS Take 2,000 Units by mouth daily.   . fluticasone (FLONASE) 50 MCG/ACT nasal spray Place 1 spray into both nostrils daily. May repeat dose in evening if needed   . ipratropium (ATROVENT) 0.03 % nasal spray Place 1 spray into both nostrils daily.    Javier Docker Oil 350 MG CAPS Take 350 mg by mouth daily.   [name] Kitchen letrozole (FEMARA) 2.5 MG tablet Take 1 tablet (2.5 mg total) by mouth daily.   [name] Kitchen levothyroxine (SYNTHROID, LEVOTHROID) 100 MCG tablet Take 100 mcg by mouth daily before breakfast.    . montelukast (SINGULAIR) 10 MG tablet Take 10 mg by mouth daily.   . ondansetron (ZOFRAN) 4 MG tablet Take 1 tablet (4 mg total) by mouth 2 (two) times daily as needed for nausea or vomiting.   . Oxycodone HCl 10 MG TABS Take 10 mg by mouth every 6 (six) hours as needed (for pain.).    [name] Kitchen polyethylene glycol powder (GLYCOLAX/MIRALAX) powder Take 17 g by mouth 2 (two) times daily.   . sertraline (ZOLOFT) 100 MG tablet Take 100 mg by mouth daily.   . simvastatin (ZOCOR) 20 MG tablet Take 20 mg by mouth at bedtime.    . traZODone (DESYREL) 50 MG tablet Take 100 mg by  mouth at bedtime.   . [DISCONTINUED] ondansetron (ZOFRAN) 4 MG tablet Take 1 tablet (4 mg total) by mouth 2 (  two) times daily as needed for nausea or vomiting.   . [DISCONTINUED] ALPRAZolam (XANAX) 1 MG tablet Take 1 mg by mouth 4 (four) times daily as needed (for panic attacks.).    . [DISCONTINUED] calcium-vitamin D (OSCAL WITH D) 500-200 MG-UNIT tablet Take 1 tablet by mouth 2 (two) times daily.   . [DISCONTINUED] diclofenac sodium (VOLTAREN) 1 % GEL Apply 2-4 g topically 4 (four) times daily as needed (for knee/hip pain.).   . [DISCONTINUED] levofloxacin (LEVAQUIN) 500 MG tablet Take 500 mg by mouth daily. 02/28/2018: 21 day therapy patient to began on 02/18/2018   Facility-Administered Encounter Medications as of 12/03/2018  Medication  . sodium chloride flush (NS) 0.9 % injection 10 mL    ALLERGIES:  Allergies  Allergen Reactions  . Adhesive [Tape]     Some adhesives cause rash, redness, blister (not all) Patient tolerates PAPER TAPE  . Amoxicillin Hives    Has patient had a PCN reaction causing immediate rash, facial/tongue/throat swelling, SOB or lightheadedness with hypotension: No Has patient had a PCN reaction causing severe rash involving mucus membranes or skin necrosis: Yes Has patient had a PCN reaction that required hospitalization: No Has patient had a PCN reaction occurring within the last 10 years: Yes If all of the above answers are "NO", then may proceed with Cephalosporin use.   Carlton Adam [Propoxyphene N-Acetaminophen] Other (See Comments)    nightmares  . Doxycycline Hives  . Erythromycin Nausea And Vomiting    Severe GI issues  . Shellfish Allergy Nausea And Vomiting  . Zithromax [Azithromycin] Other (See Comments)    Vomiting, diarrhea  . Latex Rash     PHYSICAL EXAM:  ECOG Performance status: 1  Vitals:   12/03/18 0828  BP: 137/68  Pulse: (!) 53  Resp: 18  Temp: 98.2 F (36.8 C)  SpO2: 99%   Filed Weights   12/03/18 0828  Weight: 245 lb 9.6  oz (111.4 kg)    Physical Exam Constitutional:      Appearance: Normal appearance. She is normal weight.  Cardiovascular:     Rate and Rhythm: Normal rate and regular rhythm.     Heart sounds: Normal heart sounds.  Pulmonary:     Effort: Pulmonary effort is normal.     Breath sounds: Normal breath sounds.  Musculoskeletal: Normal range of motion.  Skin:    General: Skin is warm and dry.  Neurological:     Mental Status: She is alert and oriented to person, place, and time. Mental status is at baseline.  Psychiatric:        Mood and Affect: Mood normal.        Behavior: Behavior normal.        Thought Content: Thought content normal.        Judgment: Judgment normal.   Breast: No palpable masses, no skin changes or nipple discharge, no adenopathy.   LABORATORY DATA:  I have reviewed the labs as listed.  CBC    Component Value Date/Time   WBC 6.6 12/02/2018 1101   RBC 4.26 12/02/2018 1101   HGB 13.8 12/02/2018 1101   HCT 43.3 12/02/2018 1101   PLT 213 12/02/2018 1101   MCV 101.6 (H) 12/02/2018 1101   MCH 32.4 12/02/2018 1101   MCHC 31.9 12/02/2018 1101   RDW 12.7 12/02/2018 1101   LYMPHSABS 1.9 12/02/2018 1101   MONOABS 0.5 12/02/2018 1101   EOSABS 0.1 12/02/2018 1101   BASOSABS 0.0 12/02/2018 1101   CMP Latest Ref Rng &  Units 12/02/2018 07/30/2018 04/02/2018  Glucose 70 - 99 mg/dL 87 101(H) 101(H)  BUN 6 - 20 mg/dL 23(H) 16 16  Creatinine 0.44 - 1.00 mg/dL 1.00 1.05(H) 0.96  Sodium 135 - 145 mmol/L 140 138 139  Potassium 3.5 - 5.1 mmol/L 4.0 4.7 4.5  Chloride 98 - 111 mmol/L 106 104 102  CO2 22 - 32 mmol/L _0 Calcium 8.9 - 10.3 mg/dL 9.7 9.9 10.5(H)  Total Protein 6.5 - 8.1 g/dL 6.9 7.1 7.3  Total Bilirubin 0.3 - 1.2 mg/dL 0.6 0.3 0.8  Alkaline Phos 38 - 126 U/L 64 73 80  AST 15 - 41 U/L 14(L) 16 22  ALT 0 - 44 U/L _1 DIAGNOSTIC IMAGING:  I have independently reviewed the scans and discussed with the patient.   I have reviewed Francene Finders, NP's note and agree with the documentation.  I personally performed a face-to-face visit, made revisions and my assessment and plan is as follows.    ASSESSMENT & PLAN:   Breast cancer of upper-outer quadrant of left female breast ([place]) 1.  T1c N0 left breast cancer, ER positive, PR/HER-2 negative: - Status post lumpectomy on 08/24/2016, pathology showing 1.9 cm, grade 3, 0 out of 4 lymph nodes positive, margins negative, Ki-67 of 70% -Oncotype DX recurrence score of 30, BCI-high risk, INVITAE test on 11/14/2017 negative for hereditary breast cancer. -4 cycles of adjuvant TC from 09/25/2016 through 12/05/2016 - Anastrozole started in February 2018, switched to letrozole in February 2019 secondary to hair thinning.  -Mammogram on 07/30/2018 shows BI-RADS Category 2. - She is continuing to have issues with hair thinning.  However would like to continue letrozole at this time.  She is also experiencing some nausea on and off.  No vomiting reported. - We will refill Zofran for her nausea.  I have suggested her to take letrozole at bedtime. -Physical examination today did not reveal any suspicious masses. -We will see her back in 6 months for follow-up with repeat blood work.  We will schedule her mammogram in October.   2.  Bone density: -DEXA scan on 02/14/2017 shows T score of -0.2. -She was counseled to take vitamin D and calcium.  She takes Tums thousand milligrams daily as she is allergic to calcium from shells. -We will plan to repeat DEXA scan in 3 years from the last one.      Orders placed this encounter:  Orders Placed This Encounter  Procedures  . Lactate dehydrogenase  . CBC with Differential/Platelet  . Comprehensive metabolic panel      Derek Jack, MD [place] [phone]

## 2019-01-01 DIAGNOSIS — R509 Fever, unspecified: Secondary | ICD-10-CM | POA: Diagnosis not present

## 2019-01-01 DIAGNOSIS — J0100 Acute maxillary sinusitis, unspecified: Secondary | ICD-10-CM | POA: Diagnosis not present

## 2019-01-15 IMAGING — MG 2D DIGITAL DIAGNOSTIC BILATERAL MAMMOGRAM WITH CAD AND ADJUNCT T
3 series · 3 of 7 positions shown · non-contrast
Comparison: Previous exam(s).

CLINICAL DATA: Right lumpectomy.  Annual mammography.

EXAM:
2D DIGITAL DIAGNOSTIC BILATERAL MAMMOGRAM WITH CAD AND ADJUNCT TOMO

[R CC]
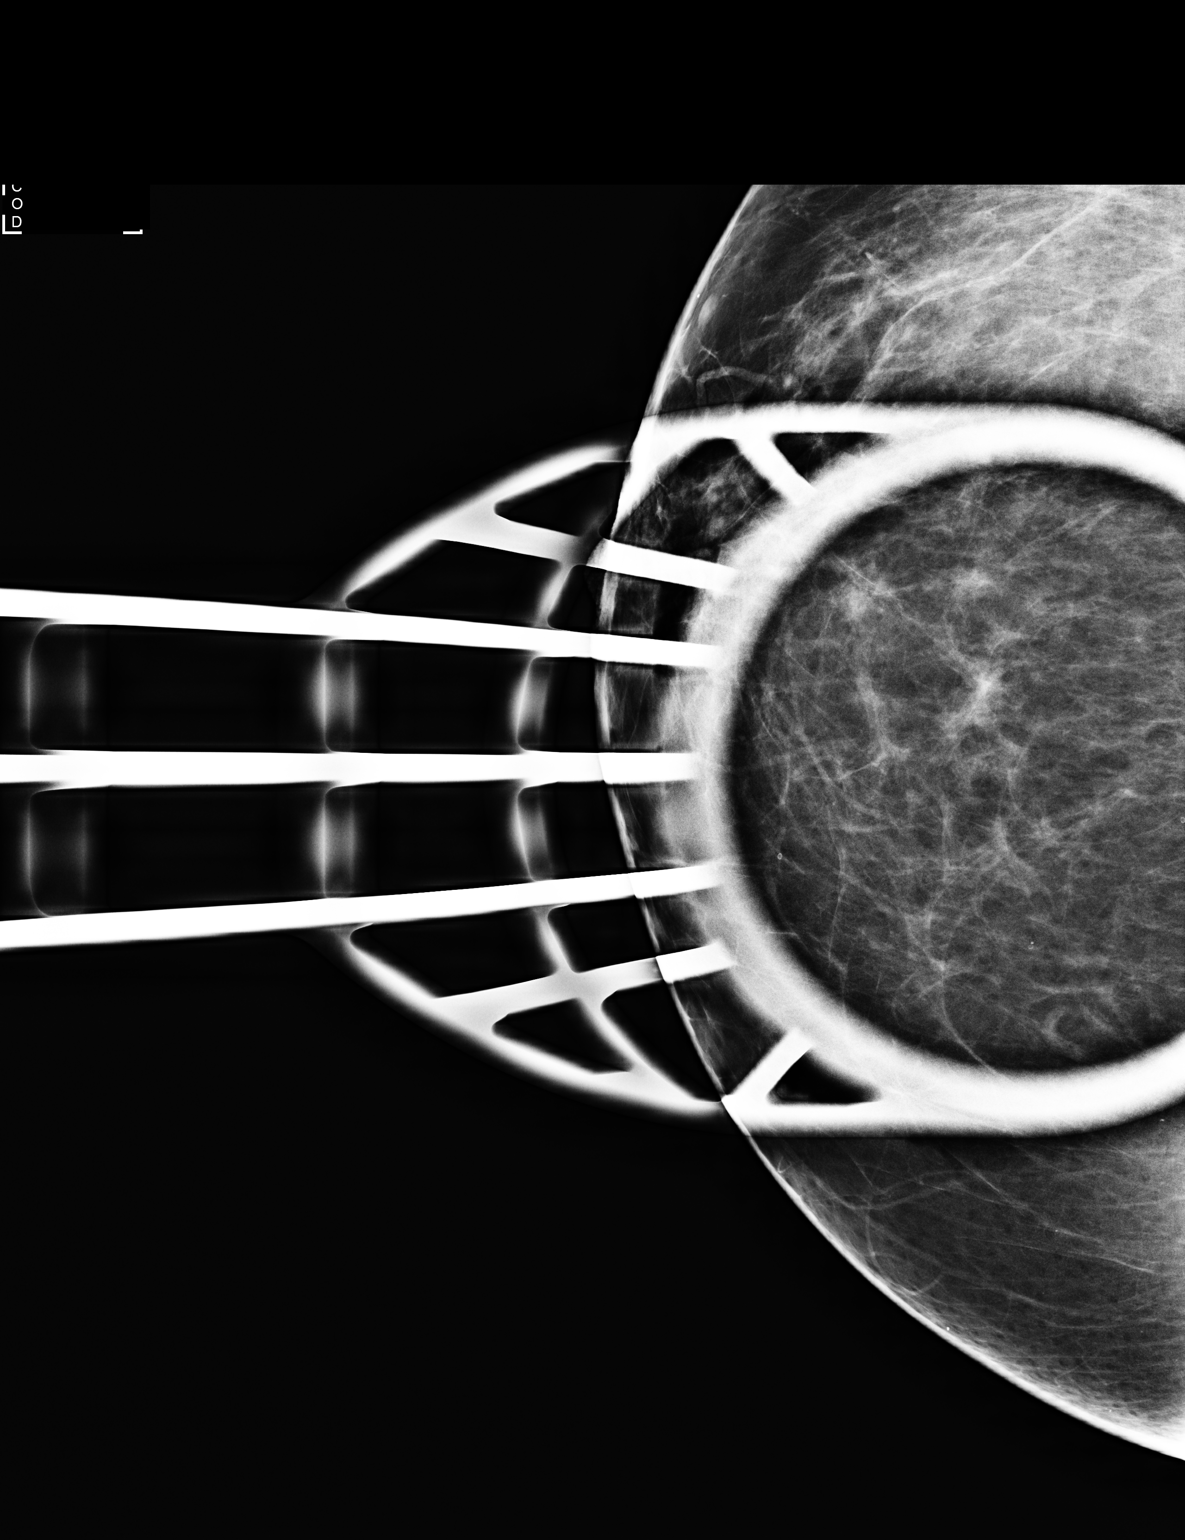

[R CC synth-2D]
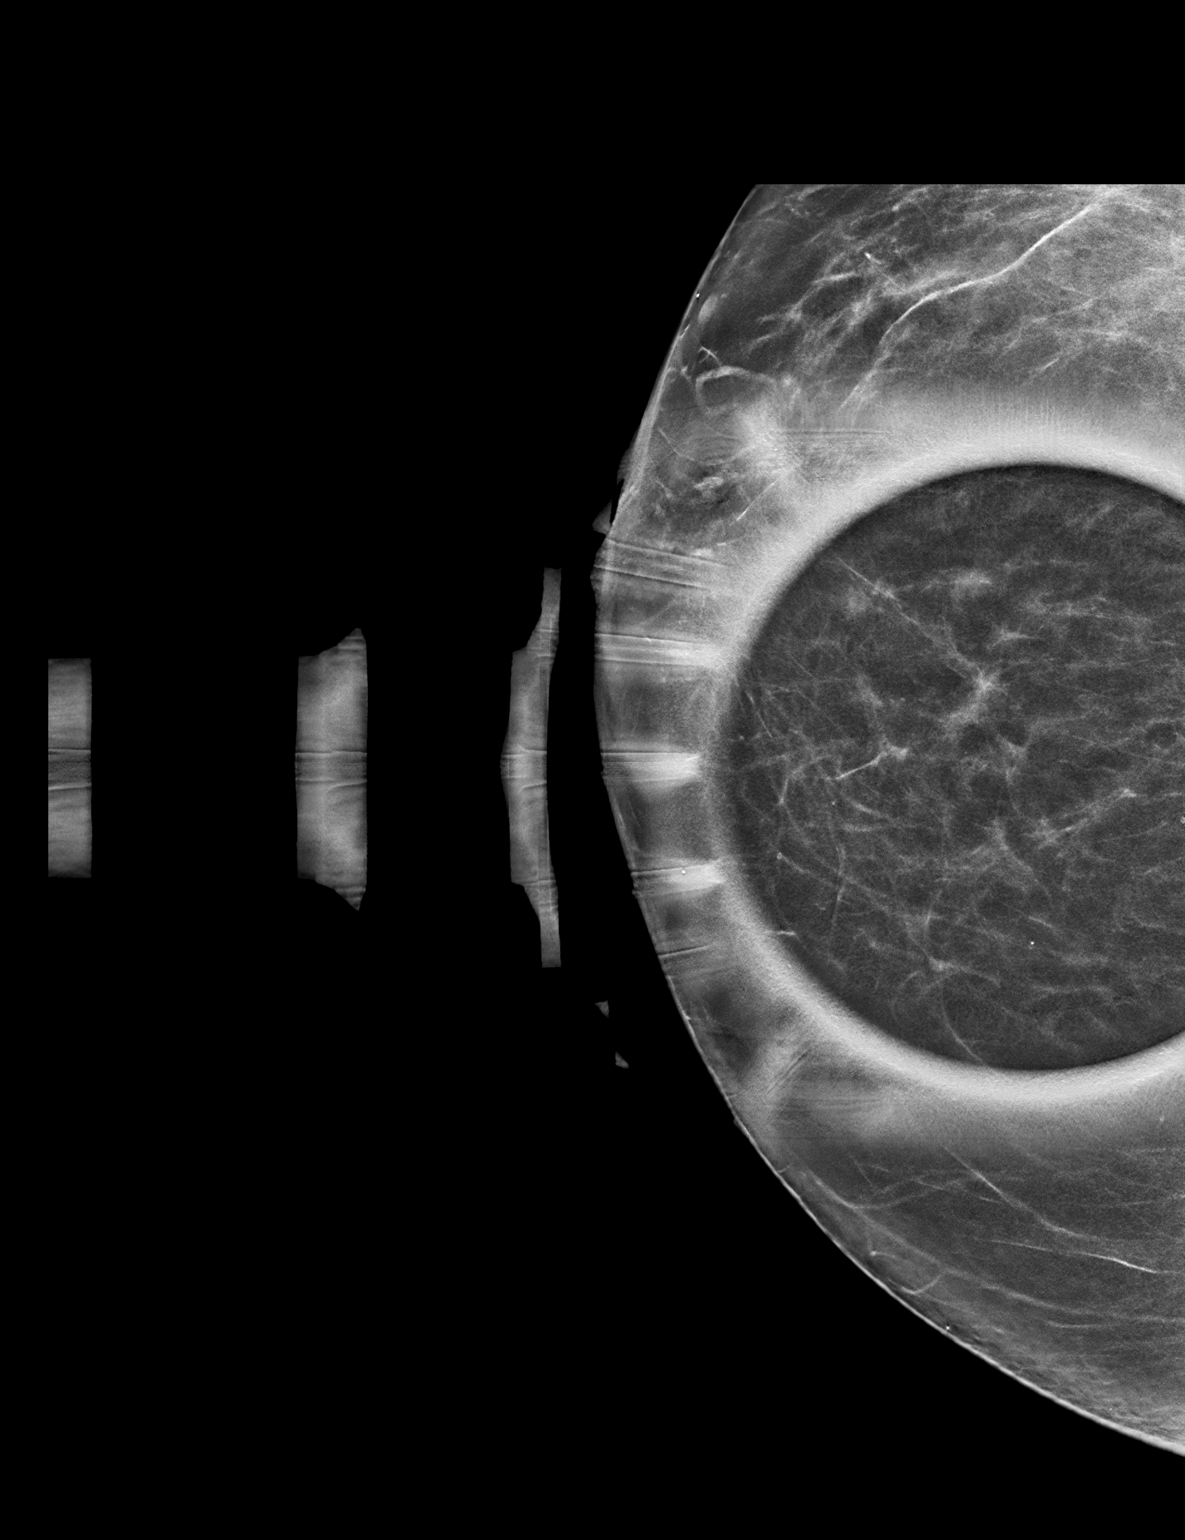

[R CC tomo · tomo slice 31/60.0]
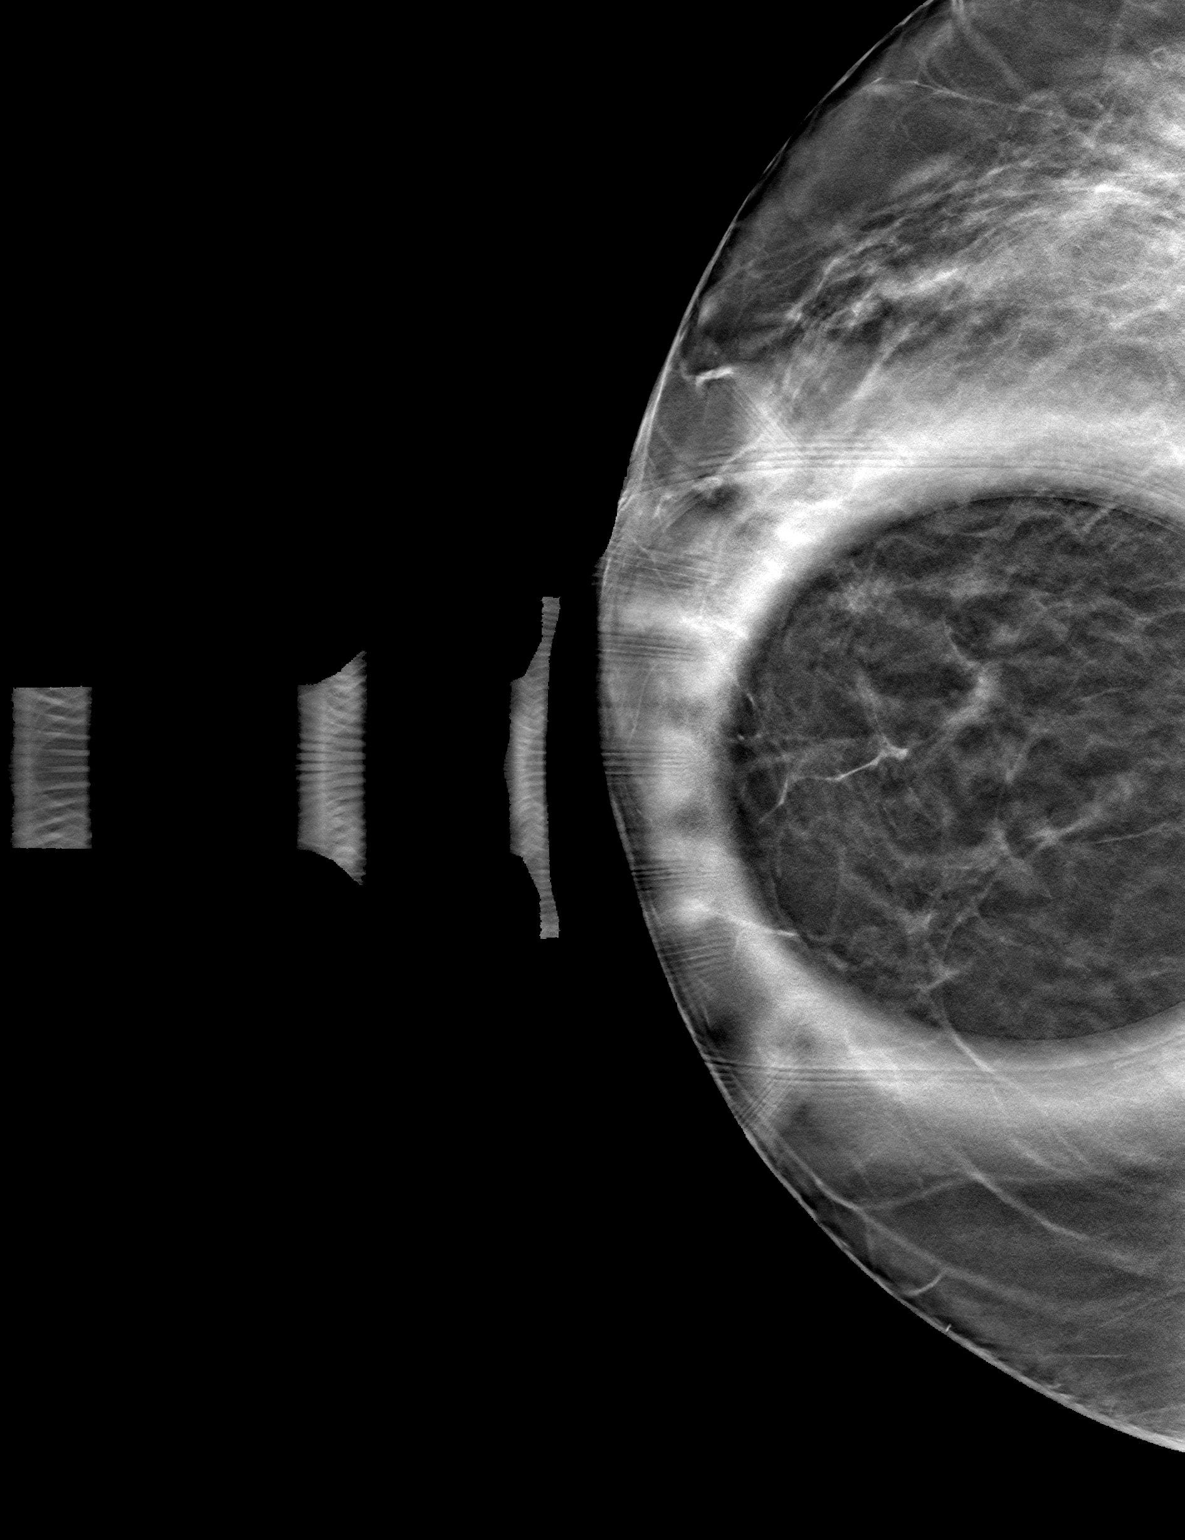

[3 of 7 positions shown; findings below may reference images not displayed]

ACR Breast Density Category b: There are scattered areas of
fibroglandular density.
FINDINGS: The left lumpectomy scar appears as expected. No suspicious masses,
calcifications, or distortion in either breast.

Mammographic images were processed with CAD.
IMPRESSION: No mammographic evidence of malignancy.

RECOMMENDATION:
Annual diagnostic mammography.

I have discussed the findings and recommendations with the patient.
Results were also provided in writing at the conclusion of the
visit. If applicable, a reminder letter will be sent to the patient
regarding the next appointment.

BI-RADS CATEGORY  2: Benign.

## 2019-01-27 DIAGNOSIS — M5416 Radiculopathy, lumbar region: Secondary | ICD-10-CM | POA: Diagnosis not present

## 2019-01-27 DIAGNOSIS — M47812 Spondylosis without myelopathy or radiculopathy, cervical region: Secondary | ICD-10-CM | POA: Diagnosis not present

## 2019-01-27 DIAGNOSIS — G894 Chronic pain syndrome: Secondary | ICD-10-CM | POA: Diagnosis not present

## 2019-01-27 DIAGNOSIS — M47816 Spondylosis without myelopathy or radiculopathy, lumbar region: Secondary | ICD-10-CM | POA: Diagnosis not present

## 2019-02-26 DIAGNOSIS — Z853 Personal history of malignant neoplasm of breast: Secondary | ICD-10-CM | POA: Diagnosis not present

## 2019-03-25 DIAGNOSIS — M47812 Spondylosis without myelopathy or radiculopathy, cervical region: Secondary | ICD-10-CM | POA: Diagnosis not present

## 2019-03-25 DIAGNOSIS — M5416 Radiculopathy, lumbar region: Secondary | ICD-10-CM | POA: Diagnosis not present

## 2019-03-25 DIAGNOSIS — G894 Chronic pain syndrome: Secondary | ICD-10-CM | POA: Diagnosis not present

## 2019-03-25 DIAGNOSIS — M47816 Spondylosis without myelopathy or radiculopathy, lumbar region: Secondary | ICD-10-CM | POA: Diagnosis not present

## 2019-04-07 ENCOUNTER — Ambulatory Visit (HOSPITAL_COMMUNITY)
Admission: RE | Admit: 2019-04-07 | Discharge: 2019-04-07 | Disposition: A | Discharge status: Home / Self Care | Payer: Medicare Other | Source: Ambulatory Visit | Attending: Family Medicine | Admitting: Family Medicine

## 2019-04-07 ENCOUNTER — Other Ambulatory Visit: Payer: Self-pay

## 2019-04-07 ENCOUNTER — Other Ambulatory Visit: Payer: Self-pay | Admitting: Family Medicine

## 2019-04-07 DIAGNOSIS — R93421 Abnormal radiologic findings on diagnostic imaging of right kidney: Secondary | ICD-10-CM | POA: Insufficient documentation

## 2019-04-07 DIAGNOSIS — N342 Other urethritis: Secondary | ICD-10-CM | POA: Diagnosis not present

## 2019-04-07 DIAGNOSIS — Z87448 Personal history of other diseases of urinary system: Secondary | ICD-10-CM | POA: Diagnosis not present

## 2019-04-07 DIAGNOSIS — R350 Frequency of micturition: Secondary | ICD-10-CM | POA: Diagnosis not present

## 2019-04-07 DIAGNOSIS — Z6841 Body Mass Index (BMI) 40.0 and over, adult: Secondary | ICD-10-CM | POA: Diagnosis not present

## 2019-05-14 DIAGNOSIS — M79672 Pain in left foot: Secondary | ICD-10-CM | POA: Diagnosis not present

## 2019-05-14 DIAGNOSIS — M722 Plantar fascial fibromatosis: Secondary | ICD-10-CM | POA: Diagnosis not present

## 2019-05-20 DIAGNOSIS — G894 Chronic pain syndrome: Secondary | ICD-10-CM | POA: Diagnosis not present

## 2019-05-20 DIAGNOSIS — M5416 Radiculopathy, lumbar region: Secondary | ICD-10-CM | POA: Diagnosis not present

## 2019-05-20 DIAGNOSIS — M47812 Spondylosis without myelopathy or radiculopathy, cervical region: Secondary | ICD-10-CM | POA: Diagnosis not present

## 2019-05-20 DIAGNOSIS — M47816 Spondylosis without myelopathy or radiculopathy, lumbar region: Secondary | ICD-10-CM | POA: Diagnosis not present

## 2019-05-27 ENCOUNTER — Inpatient Hospital Stay (HOSPITAL_COMMUNITY): Payer: Medicare Other | Attending: Hematology

## 2019-05-27 ENCOUNTER — Other Ambulatory Visit: Payer: Self-pay

## 2019-05-27 DIAGNOSIS — C50412 Malignant neoplasm of upper-outer quadrant of left female breast: Secondary | ICD-10-CM

## 2019-05-27 DIAGNOSIS — Z170 Estrogen receptor positive status [ER+]: Secondary | ICD-10-CM | POA: Diagnosis not present

## 2019-05-27 LAB — COMPREHENSIVE METABOLIC PANEL
ALT: 14 U/L (ref 0–44)
AST: 21 U/L (ref 15–41)
Albumin: 4.1 g/dL (ref 3.5–5.0)
Alkaline Phosphatase: 83 U/L (ref 38–126)
Anion gap: 9 (ref 5–15)
BUN: 13 mg/dL (ref 6–20)
CO2: 26 mmol/L (ref 22–32)
Calcium: 10.6 mg/dL — ABNORMAL HIGH (ref 8.9–10.3)
Chloride: 106 mmol/L (ref 98–111)
Creatinine, Ser: 1.03 mg/dL — ABNORMAL HIGH (ref 0.44–1.00)
GFR calc Af Amer: >60 mL/min (ref >60)
GFR calc non Af Amer: 59 mL/min — ABNORMAL LOW (ref >60)
Glucose, Bld: 124 mg/dL — ABNORMAL HIGH (ref 70–99)
Potassium: 4 mmol/L (ref 3.5–5.1)
Sodium: 141 mmol/L (ref 135–145)
Total Bilirubin: 0.6 mg/dL (ref 0.3–1.2)
Total Protein: 7.4 g/dL (ref 6.5–8.1)

## 2019-05-27 LAB — CBC WITH DIFFERENTIAL/PLATELET
Abs Immature Granulocytes: 0.01 10*3/uL (ref 0.00–0.07)
Basophils Absolute: 0.1 10*3/uL (ref 0.0–0.1)
Basophils Relative: 1 %
Eosinophils Absolute: 0.2 10*3/uL (ref 0.0–0.5)
Eosinophils Relative: 2 %
HCT: 47.3 % — ABNORMAL HIGH (ref 36.0–46.0)
Hemoglobin: 15.5 g/dL — ABNORMAL HIGH (ref 12.0–15.0)
Immature Granulocytes: 0 %
Lymphocytes Relative: 21 %
Lymphs Abs: 1.5 10*3/uL (ref 0.7–4.0)
MCH: 31.6 pg (ref 26.0–34.0)
MCHC: 32.8 g/dL (ref 30.0–36.0)
MCV: 96.5 fL (ref 80.0–100.0)
Monocytes Absolute: 0.4 10*3/uL (ref 0.1–1.0)
Monocytes Relative: 5 %
Neutro Abs: 5 10*3/uL (ref 1.7–7.7)
Neutrophils Relative %: 71 %
Platelets: 222 10*3/uL (ref 150–400)
RBC: 4.9 MIL/uL (ref 3.87–5.11)
RDW: 12.2 % (ref 11.5–15.5)
WBC: 7 10*3/uL (ref 4.0–10.5)
nRBC: 0 % (ref 0.0–0.2)

## 2019-05-27 LAB — LACTATE DEHYDROGENASE: LDH: 131 U/L (ref 98–192)

## 2019-05-29 ENCOUNTER — Other Ambulatory Visit (HOSPITAL_COMMUNITY): Payer: Medicare Other

## 2019-06-03 ENCOUNTER — Ambulatory Visit (HOSPITAL_COMMUNITY): Payer: Medicare Other | Admitting: Hematology

## 2019-06-04 DIAGNOSIS — M722 Plantar fascial fibromatosis: Secondary | ICD-10-CM | POA: Diagnosis not present

## 2019-06-04 DIAGNOSIS — M79671 Pain in right foot: Secondary | ICD-10-CM | POA: Diagnosis not present

## 2019-06-05 ENCOUNTER — Ambulatory Visit (HOSPITAL_COMMUNITY): Payer: Medicare Other | Admitting: Hematology

## 2019-06-10 ENCOUNTER — Ambulatory Visit (INDEPENDENT_AMBULATORY_CARE_PROVIDER_SITE_OTHER): Payer: Medicare Other | Admitting: Urology

## 2019-06-10 DIAGNOSIS — N3000 Acute cystitis without hematuria: Secondary | ICD-10-CM | POA: Diagnosis not present

## 2019-06-10 DIAGNOSIS — R3915 Urgency of urination: Secondary | ICD-10-CM

## 2019-06-10 DIAGNOSIS — N200 Calculus of kidney: Secondary | ICD-10-CM | POA: Diagnosis not present

## 2019-06-25 DIAGNOSIS — M722 Plantar fascial fibromatosis: Secondary | ICD-10-CM | POA: Diagnosis not present

## 2019-06-25 DIAGNOSIS — M79671 Pain in right foot: Secondary | ICD-10-CM | POA: Diagnosis not present

## 2019-07-02 ENCOUNTER — Other Ambulatory Visit (HOSPITAL_COMMUNITY): Payer: Self-pay | Admitting: Hematology

## 2019-07-02 DIAGNOSIS — Z170 Estrogen receptor positive status [ER+]: Secondary | ICD-10-CM

## 2019-07-02 DIAGNOSIS — C50412 Malignant neoplasm of upper-outer quadrant of left female breast: Secondary | ICD-10-CM

## 2019-07-17 DIAGNOSIS — M47816 Spondylosis without myelopathy or radiculopathy, lumbar region: Secondary | ICD-10-CM | POA: Diagnosis not present

## 2019-07-17 DIAGNOSIS — M5416 Radiculopathy, lumbar region: Secondary | ICD-10-CM | POA: Diagnosis not present

## 2019-07-17 DIAGNOSIS — M47812 Spondylosis without myelopathy or radiculopathy, cervical region: Secondary | ICD-10-CM | POA: Diagnosis not present

## 2019-07-17 DIAGNOSIS — G894 Chronic pain syndrome: Secondary | ICD-10-CM | POA: Diagnosis not present

## 2019-09-11 DIAGNOSIS — M5416 Radiculopathy, lumbar region: Secondary | ICD-10-CM | POA: Diagnosis not present

## 2019-09-11 DIAGNOSIS — M47816 Spondylosis without myelopathy or radiculopathy, lumbar region: Secondary | ICD-10-CM | POA: Diagnosis not present

## 2019-09-11 DIAGNOSIS — M47812 Spondylosis without myelopathy or radiculopathy, cervical region: Secondary | ICD-10-CM | POA: Diagnosis not present

## 2019-09-11 DIAGNOSIS — G894 Chronic pain syndrome: Secondary | ICD-10-CM | POA: Diagnosis not present

## 2019-09-22 ENCOUNTER — Other Ambulatory Visit (HOSPITAL_COMMUNITY): Payer: Self-pay | Admitting: Hematology

## 2019-09-22 DIAGNOSIS — Z853 Personal history of malignant neoplasm of breast: Secondary | ICD-10-CM

## 2019-09-22 DIAGNOSIS — Z08 Encounter for follow-up examination after completed treatment for malignant neoplasm: Secondary | ICD-10-CM

## 2019-10-02 ENCOUNTER — Other Ambulatory Visit: Payer: Self-pay | Admitting: Obstetrics & Gynecology

## 2019-10-02 DIAGNOSIS — N6320 Unspecified lump in the left breast, unspecified quadrant: Secondary | ICD-10-CM

## 2019-10-03 DIAGNOSIS — E039 Hypothyroidism, unspecified: Secondary | ICD-10-CM | POA: Diagnosis not present

## 2019-10-03 DIAGNOSIS — Z0001 Encounter for general adult medical examination with abnormal findings: Secondary | ICD-10-CM | POA: Diagnosis not present

## 2019-10-03 DIAGNOSIS — Z6841 Body Mass Index (BMI) 40.0 and over, adult: Secondary | ICD-10-CM | POA: Diagnosis not present

## 2019-10-03 DIAGNOSIS — Z1389 Encounter for screening for other disorder: Secondary | ICD-10-CM | POA: Diagnosis not present

## 2019-10-07 ENCOUNTER — Ambulatory Visit (HOSPITAL_COMMUNITY): Payer: Medicare Other

## 2019-10-07 ENCOUNTER — Encounter (HOSPITAL_COMMUNITY): Payer: Medicare Other

## 2019-10-10 ENCOUNTER — Other Ambulatory Visit: Payer: Self-pay | Admitting: Obstetrics & Gynecology

## 2019-10-10 ENCOUNTER — Ambulatory Visit
Admission: RE | Admit: 2019-10-10 | Discharge: 2019-10-10 | Disposition: A | Discharge status: Home / Self Care | Payer: Medicare Other | Source: Ambulatory Visit | Attending: Obstetrics & Gynecology | Admitting: Obstetrics & Gynecology

## 2019-10-10 ENCOUNTER — Other Ambulatory Visit: Payer: Self-pay

## 2019-10-10 DIAGNOSIS — N6320 Unspecified lump in the left breast, unspecified quadrant: Secondary | ICD-10-CM

## 2019-10-10 DIAGNOSIS — N6489 Other specified disorders of breast: Secondary | ICD-10-CM | POA: Diagnosis not present

## 2019-10-10 DIAGNOSIS — R928 Other abnormal and inconclusive findings on diagnostic imaging of breast: Secondary | ICD-10-CM | POA: Diagnosis not present

## 2019-10-30 ENCOUNTER — Ambulatory Visit (HOSPITAL_COMMUNITY): Payer: Medicare Other | Admitting: Nurse Practitioner

## 2019-10-30 ENCOUNTER — Ambulatory Visit (HOSPITAL_COMMUNITY): Payer: Medicare Other | Admitting: Hematology

## 2019-11-03 ENCOUNTER — Ambulatory Visit (HOSPITAL_COMMUNITY): Payer: Medicare Other | Admitting: Nurse Practitioner

## 2019-11-06 DIAGNOSIS — M47816 Spondylosis without myelopathy or radiculopathy, lumbar region: Secondary | ICD-10-CM | POA: Diagnosis not present

## 2019-11-06 DIAGNOSIS — M47812 Spondylosis without myelopathy or radiculopathy, cervical region: Secondary | ICD-10-CM | POA: Diagnosis not present

## 2019-11-06 DIAGNOSIS — M5416 Radiculopathy, lumbar region: Secondary | ICD-10-CM | POA: Diagnosis not present

## 2019-11-06 DIAGNOSIS — G894 Chronic pain syndrome: Secondary | ICD-10-CM | POA: Diagnosis not present

## 2019-12-02 ENCOUNTER — Other Ambulatory Visit: Payer: Self-pay

## 2019-12-02 ENCOUNTER — Inpatient Hospital Stay (HOSPITAL_COMMUNITY): Payer: Medicare Other | Attending: Nurse Practitioner | Admitting: Nurse Practitioner

## 2019-12-02 DIAGNOSIS — Z13820 Encounter for screening for osteoporosis: Secondary | ICD-10-CM | POA: Diagnosis not present

## 2019-12-02 DIAGNOSIS — C50412 Malignant neoplasm of upper-outer quadrant of left female breast: Secondary | ICD-10-CM | POA: Diagnosis not present

## 2019-12-02 DIAGNOSIS — R110 Nausea: Secondary | ICD-10-CM

## 2019-12-02 DIAGNOSIS — Z170 Estrogen receptor positive status [ER+]: Secondary | ICD-10-CM | POA: Diagnosis not present

## 2019-12-02 MED ORDER — LETROZOLE 2.5 MG PO TABS: 2.5000 mg | ORAL_TABLET | Freq: Every day | ORAL | 3 refills | Status: DC

## 2019-12-02 MED ORDER — ONDANSETRON HCL 4 MG PO TABS: 4.0000 mg | ORAL_TABLET | Freq: Two times a day (BID) | ORAL | 3 refills | Status: DC | PRN

## 2019-12-02 NOTE — Patient Instructions (Signed)
[place] at [facility] Discharge Instructions  Follow up in 6 months with labs and mammogram and Dexa scan.   Thank you for choosing [place] at [facility] to provide your oncology and hematology care.  To afford each patient quality time with our provider, please arrive at least 15 minutes before your scheduled appointment time.   If you have a lab appointment with the [facility] please come in thru the Main Entrance and check in at the main information desk.  You need to re-schedule your appointment should you arrive 10 or more minutes late.  We strive to give you quality time with our providers, and arriving late affects you and other patients whose appointments are after yours.  Also, if you no show three or more times for appointments you may be dismissed from the clinic at the providers discretion.     Again, thank you for choosing [facility].  Our hope is that these requests will decrease the amount of time that you wait before being seen by our physicians.       _____________________________________________________________  Should you have questions after your visit to [facility], please contact our office at [phone] between the hours of 8:00 a.m. and 4:30 p.m.  Voicemails left after 4:00 p.m. will not be returned until the following business day.  For prescription refill requests, have your pharmacy contact our office and allow 72 hours.    Due to Covid, you will need to wear a mask upon entering the hospital. If you do not have a mask, a mask will be given to you at the Main Entrance upon arrival. For doctor visits, patients may have 1 support person with them. For treatment visits, patients can not have anyone with them due to social distancing guidelines and our immunocompromised population.

## 2019-12-02 NOTE — Assessment & Plan Note (Signed)
1.  Left breast cancer: -Status post lumpectomy on 08/24/2016, pathology showing 1.9 cm, grade 3, 0 out of 4 lymph nodes positive, margins negative, Ki 67 of 70%, ER positive, PR/HER-2 negative. -Oncotype DX recurrence score 30, BCI high risk, INVITAE test on 11/14/2017 - for hereditary breast cancer. -4 cycles of adjuvant TC from 09/25/2016 through 12/05/2016. -Anastrozole started February 2018, switch to letrozole in February 2019 secondary to hair thinning. -Mammogram on 07/30/2018 showed BI-RADS Category 2 negative. -She is continuing to have issues with hair thinning.  However would like to continue letrozole at this time.  She is also experiencing some nausea on and off.  No vomiting reported. -We suggested she take her letrozole at bedtime and also use Zofran for nausea. -Last mammogram done on 10/10/2019 showed B RADS category 3 probably benign.  Suggested repeat mammogram in 6 months. -She will follow-up in 6 months with repeat labs and mammogram.  2.  Bone density: -DEXA scan on 02/14/2017 showed T score of -0.2. -She was counseled to take vitamin D and calcium daily.  She is taking Tums 1000 mg daily as she is allergic to calcium from shells. -We plan to repeat a DEXA scan in 3 years from the last scan.

## 2019-12-02 NOTE — Progress Notes (Signed)
[address] [place], [place] [id number]   CLINIC:  Medical Oncology/Hematology  PCP:  Sharilyn Sites, [address] [place] [id number] [phone]   REASON FOR VISIT: Follow-up for breast cancer  CURRENT THERAPY: Letrozole  BRIEF ONCOLOGIC HISTORY:  Oncology History  Breast cancer of upper-outer quadrant of left female breast ([place])  07/26/2016 Mammogram   Area of developing asymmetry with distortion located within the upper-outer quadrant left breast. Tissue sampling via tomosynthesis guided biopsy is recommended and will be scheduled.  RECOMMENDATION: Left breast tomosynthesis guided biopsy. This has been scheduled for 07/28/2016.    07/28/2016 Initial Biopsy   Coil shaped clip corresponds to the asymmetry in the outer left breast. Clip is well positioned at the biopsy site. 2. X shaped clip corresponds to the ASYMMETRY/DISTORTION in the upper-outer quadrant of the left breast. Clip is positioned approximately 1 cm medial to the center of the biopsy cavity   07/28/2016 Pathology Results   Estrogen Receptor: 80%, POSITIVE, MODERATE STAINING INTENSITY Progesterone Receptor: 0%, NEGATIVE Proliferation Marker Ki67: 70% HER 2 negative by FISH Breast, left, needle core biopsy, upper outer quadrant - INVASIVE DUCTAL CARCINOMA, SEE COMMENT. - HIGH GRADE DUCTAL CARCINOMA IN SITU WITH NECROSIS.   08/24/2016 Oncotype testing   Recurrence Score Result 30, 10 year risk of distant recurrence Tamoxifen alone 20%   10/03/2016 - 12/05/2016 Chemotherapy   The patient had palonosetron (ALOXI) injection 0.25 mg, 0.25 mg, Intravenous,  Once, 2 of 4 cycles Administration: 0.25 mg (10/24/2016)  pegfilgrastim (NEULASTA ONPRO KIT) injection 6 mg, 6 mg, Subcutaneous, Once, 2 of 4 cycles Administration: 6 mg (10/24/2016)  cyclophosphamide (CYTOXAN) 1,380 mg in sodium chloride 0.9 % 250 mL chemo infusion, 600 mg/m2 = 1,380 mg, Intravenous,  Once, 2 of 4  cycles Administration: 1,380 mg (10/24/2016)  DOCEtaxel (TAXOTERE) 170 mg in dextrose 5 % 250 mL chemo infusion, 75 mg/m2 = 170 mg, Intravenous,  Once, 2 of 4 cycles Dose modification: 65 mg/m2 (original dose 75 mg/m2, Cycle 2, Reason: Dose not tolerated)  fosaprepitant (EMEND) 150 mg, dexamethasone (DECADRON) 12 mg in sodium chloride 0.9 % 145 mL IVPB, , Intravenous,  Once, 1 of 3 cycles Administration:  (10/24/2016)  for chemotherapy treatment.      - 02/06/2017 Radiation Therapy   Completed adjuvant breast RT    02/26/2017 -  Anti-estrogen oral therapy   Started anastrozole   03/05/2017 Pathology Results   BCI-high risk of late recurrence (years 5-10) at 10.6% with a high likelihood of benefit from extended endocrine therapy.  High overall risk of recurrence (years 0-10) 19.5% and a high likelihood of benefit from extended endocrine therapy.   11/14/2017 Genetic Testing   Negative genetic testing on the Multi-cancer panel.  The Multi-Gene Panel offered by [facility] includes sequencing and/or deletion duplication testing of the following 83 genes: ALK, APC, ATM, AXIN2,BAP1,  BARD1, BLM, BMPR1A, BRCA1, BRCA2, BRIP1, CASR, CDC73, CDH1, CDK4, CDKN1B, CDKN1C, CDKN2A (p14ARF), CDKN2A (p16INK4a), CEBPA, CHEK2, CTNNA1, DICER1, DIS3L2, EGFR (c.2369C>T, p.Thr790Met variant only), EPCAM (Deletion/duplication testing only), FH, FLCN, GATA2, GPC3, GREM1 (Promoter region deletion/duplication testing only), HOXB13 (c.251G>A, p.Gly84Glu), HRAS, KIT, MAX, MEN1, MET, MITF (c.952G>A, p.Glu318Lys variant only), MLH1, MSH2, MSH3, MSH6, MUTYH, NBN, NF1, NF2, NTHL1, PALB2, PDGFRA, PHOX2B, PMS2, POLD1, POLE, POT1, PRKAR1A, PTCH1, PTEN, RAD50, RAD51C, RAD51D, RB1, RECQL4, RET, RUNX1, SDHAF2, SDHA (sequence changes only), SDHB, SDHC, SDHD, SMAD4, SMARCA4, SMARCB1, SMARCE1, STK11, SUFU, TERT, TERT, TMEM127, TP53, TSC1, TSC2, VHL, WRN and WT1. The report date  is November 14, 2017.       CANCER STAGING: Cancer  Staging Breast cancer of upper-outer quadrant of left female breast ([place]) Staging form: Breast, AJCC 7th Edition - Pathologic: Stage IA (T1c, N0, cM0) - Unsigned - Pathologic: No stage assigned - Unsigned    INTERVAL HISTORY:  Ms. Gartin 64 y.o. female was called for a telephone interview today for her breast cancer.  Patient reports she is taking her letrozole as prescribed.  She does report she is having nausea daily.  She reports is usually first thing in the morning.  She is out of her Zofran.  Patient denies any new lumps or bumps felt.  Patient reports she does have pain from her mammogram that was done in December. Denies any nausea, vomiting, or diarrhea. Denies any new pains. Had not noticed any recent bleeding such as epistaxis, hematuria or hematochezia. Denies recent chest pain on exertion, shortness of breath on minimal exertion, pre-syncopal episodes, or palpitations. Denies any numbness or tingling in hands or feet. Denies any recent fevers, infections, or recent hospitalizations. Patient reports appetite at 100% and energy level at 50%.  She is eating well maintain her weight at this time.    REVIEW OF SYSTEMS:  Review of Systems  Constitutional: Positive for fatigue.  All other systems reviewed and are negative.    PAST MEDICAL/SURGICAL HISTORY:  Past Medical History:  Diagnosis Date  . Anxiety   . Breast cancer ([facility]) 2017   Left Breast  . Chronic pain   . DDD (degenerative disc disease), cervical   . DDD (degenerative disc disease), lumbar   . Degenerative disc disease at L5-S1 level   . Depression   . GERD (gastroesophageal reflux disease)   . History of kidney stones   . Hyperlipidemia   . Hypothyroidism   . Obesity   . Osteoarthritis   . Ovarian cancer ([facility]) 2017   Left Breast Cancer  . Personal history of chemotherapy 2017   Left Breast Cancer  . PONV (postoperative nausea and vomiting)   . Sleep apnea    uses CPAP  . Spinal stenosis    Past  Surgical History:  Procedure Laterality Date  . ABDOMINAL HYSTERECTOMY     partial hyst, still have ovaries  . BREAST LUMPECTOMY Left 08/24/2016  . BREAST LUMPECTOMY WITH RADIOACTIVE SEED AND SENTINEL LYMPH NODE BIOPSY Left 08/24/2016   Procedure: LEFT BREAST PARTIAL MASTECTOMY WITH RADIOACTIVE SEED AND LEFT SENTINEL LYMPH NODE MAPPING;  Surgeon: Erroll Luna, MD;  Location: [facility];  Service: General;  Laterality: Left;  . CHOLECYSTECTOMY    . FOOT FOREIGN BODY REMOVAL Left   . PORT-A-CATH REMOVAL N/A 03/06/2018   Procedure: MINOR REMOVAL PORT-A-CATH;  Surgeon: Aviva Signs, MD;  Location: [facility];  Service: General;  Laterality: N/A;  . PORTACATH PLACEMENT Right 09/29/2016   Procedure: INSERTION PORT-A-CATH RIGHT SUBCLAVIAN;  Surgeon: Aviva Signs, MD;  Location: [facility];  Service: General;  Laterality: Right;     SOCIAL HISTORY:  Social History   Socioeconomic History  . Marital status: Married    Spouse name: Not on file  . Number of children: Not on file  . Years of education: Not on file  . Highest education level: Not on file  Occupational History  . Not on file  Tobacco Use  . Smoking status: Never Smoker  . Smokeless tobacco: Never Used  Substance and Sexual Activity  . Alcohol use: No  . Drug use: No  . Sexual activity:  Yes    Birth control/protection: None    Comment: married-1 grown son  Other Topics Concern  . Not on file  Social History Narrative   Lives with husband.  One child.     Social Determinants of Health   Financial Resource Strain:   . Difficulty of Paying Living Expenses: Not on file  Food Insecurity:   . Worried About Charity fundraiser in the Last Year: Not on file  . Ran Out of Food in the Last Year: Not on file  Transportation Needs:   . Lack of Transportation (Medical): Not on file  . Lack of Transportation (Non-Medical): Not on file  Physical Activity:   . Days of Exercise per Week: Not on file  . Minutes of Exercise  per Session: Not on file  Stress:   . Feeling of Stress : Not on file  Social Connections:   . Frequency of Communication with Friends and Family: Not on file  . Frequency of Social Gatherings with Friends and Family: Not on file  . Attends Religious Services: Not on file  . Active Member of Clubs or Organizations: Not on file  . Attends Archivist Meetings: Not on file  . Marital Status: Not on file  Intimate Partner Violence:   . Fear of Current or Ex-Partner: Not on file  . Emotionally Abused: Not on file  . Physically Abused: Not on file  . Sexually Abused: Not on file    FAMILY HISTORY:  Family History  Problem Relation Age of Onset  . COPD Mother   . Ovarian cancer Mother 61  . Uterine cancer Mother 79  . Heart attack Father 50  . Diabetes Sister   . Heart disease Sister   . Kidney cancer Brother 5  . Breast cancer Cousin   . Kidney failure Sister   . Thyroid disease Sister   . Ovarian cancer Maternal Grandmother 37  . Cancer Paternal Aunt        NOS  . Cancer Paternal Uncle        NOS  . Colon cancer Other        2 distant cousins with colon cancer in their 89s    CURRENT MEDICATIONS:  Outpatient Encounter Medications as of 12/02/2019  Medication Sig  . aspirin EC 81 MG tablet Take 81 mg by mouth at bedtime.  . calcium carbonate (TUMS - DOSED IN MG ELEMENTAL CALCIUM) 500 MG chewable tablet Chew 2 tablets by mouth daily.   . Cholecalciferol (VITAMIN D3) 2000 units TABS Take 2,000 Units by mouth daily.  . fluticasone (FLONASE) 50 MCG/ACT nasal spray Place 1 spray into both nostrils daily. May repeat dose in evening if needed  . ipratropium (ATROVENT) 0.03 % nasal spray Place 1 spray into both nostrils daily.   Javier Docker Oil 350 MG CAPS Take 350 mg by mouth daily.  [name] Kitchen letrozole (FEMARA) 2.5 MG tablet Take 1 tablet (2.5 mg total) by mouth daily.  [name] Kitchen levothyroxine (SYNTHROID, LEVOTHROID) 100 MCG tablet Take 100 mcg by mouth daily before breakfast.   .  montelukast (SINGULAIR) 10 MG tablet Take 10 mg by mouth daily.  . ondansetron (ZOFRAN) 4 MG tablet Take 1 tablet (4 mg total) by mouth 2 (two) times daily as needed for nausea or vomiting.  . Oxycodone HCl 10 MG TABS Take 10 mg by mouth every 6 (six) hours as needed (for pain.).   [name] Kitchen polyethylene glycol powder (GLYCOLAX/MIRALAX) powder Take 17 g by mouth 2 (two)  times daily.  . sertraline (ZOLOFT) 100 MG tablet Take 100 mg by mouth daily.  . simvastatin (ZOCOR) 20 MG tablet Take 20 mg by mouth at bedtime.   . traZODone (DESYREL) 50 MG tablet Take 100 mg by mouth at bedtime.  . [DISCONTINUED] calcium-vitamin D (OSCAL WITH D) 500-200 MG-UNIT tablet Take 1 tablet by mouth 2 (two) times daily.  . [DISCONTINUED] letrozole ([place]) 2.5 MG tablet TAKE 1 TABLET BY MOUTH EVERY DAY  . [DISCONTINUED] ondansetron (ZOFRAN) 4 MG tablet Take 1 tablet (4 mg total) by mouth 2 (two) times daily as needed for nausea or vomiting.   Facility-Administered Encounter Medications as of 12/02/2019  Medication  . sodium chloride flush (NS) 0.9 % injection 10 mL    ALLERGIES:  Allergies  Allergen Reactions  . Adhesive [Tape]     Some adhesives cause rash, redness, blister (not all) Patient tolerates PAPER TAPE  . Amoxicillin Hives    Has patient had a PCN reaction causing immediate rash, facial/tongue/throat swelling, SOB or lightheadedness with hypotension: No Has patient had a PCN reaction causing severe rash involving mucus membranes or skin necrosis: Yes Has patient had a PCN reaction that required hospitalization: No Has patient had a PCN reaction occurring within the last 10 years: Yes If all of the above answers are "NO", then may proceed with Cephalosporin use.   Carlton Adam [Propoxyphene N-Acetaminophen] Other (See Comments)    nightmares  . Doxycycline Hives  . Erythromycin Nausea And Vomiting    Severe GI issues  . Shellfish Allergy Nausea And Vomiting  . Zithromax [Azithromycin] Other (See Comments)     Vomiting, diarrhea  . Latex Rash     Vital signs: -Deferred due to telephone visit  Physical Exam -Deferred due to telephone visit -Patient is alert and oriented over the phone and in no acute distress.    LABORATORY DATA:  I have reviewed the labs as listed.  CBC    Component Value Date/Time   WBC 7.0 05/27/2019 1118   RBC 4.90 05/27/2019 1118   HGB 15.5 (H) 05/27/2019 1118   HCT 47.3 (H) 05/27/2019 1118   PLT 222 05/27/2019 1118   MCV 96.5 05/27/2019 1118   MCH 31.6 05/27/2019 1118   MCHC 32.8 05/27/2019 1118   RDW 12.2 05/27/2019 1118   LYMPHSABS 1.5 05/27/2019 1118   MONOABS 0.4 05/27/2019 1118   EOSABS 0.2 05/27/2019 1118   BASOSABS 0.1 05/27/2019 1118   CMP Latest Ref Rng & Units 05/27/2019 12/02/2018 07/30/2018  Glucose 70 - 99 mg/dL 124(H) 87 101(H)  BUN 6 - 20 mg/dL 13 23(H) 16  Creatinine 0.44 - 1.00 mg/dL 1.03(H) 1.00 1.05(H)  Sodium 135 - 145 mmol/L 141 140 138  Potassium 3.5 - 5.1 mmol/L 4.0 4.0 4.7  Chloride 98 - 111 mmol/L 106 106 104  CO2 22 - 32 mmol/L '26 27 26  ' Calcium 8.9 - 10.3 mg/dL 10.6(H) 9.7 9.9  Total Protein 6.5 - 8.1 g/dL 7.4 6.9 7.1  Total Bilirubin 0.3 - 1.2 mg/dL 0.6 0.6 0.3  Alkaline Phos 38 - 126 U/L 83 64 73  AST 15 - 41 U/L 21 14(L) 16  ALT 0 - 44 U/L '14 15 17    ' DIAGNOSTIC IMAGING:  I have independently reviewed the mammogram scans and discussed with the patient.  All questions were answered to patient's stated satisfaction. Encouraged patient to call with any new concerns or questions before his next visit to the cancer center and we can certain see him  sooner, if needed.     ASSESSMENT & PLAN:   Breast cancer of upper-outer quadrant of left female breast ([place]) 1.  Left breast cancer: -Status post lumpectomy on 08/24/2016, pathology showing 1.9 cm, grade 3, 0 out of 4 lymph nodes positive, margins negative, Ki 67 of 70%, ER positive, PR/HER-2 negative. -Oncotype DX recurrence score 30, BCI high risk, [facility] test on  11/14/2017 - for hereditary breast cancer. -4 cycles of adjuvant TC from 09/25/2016 through 12/05/2016. -Anastrozole started February 2018, switch to letrozole in February 2019 secondary to hair thinning. -Mammogram on 07/30/2018 showed BI-RADS Category 2 negative. -She is continuing to have issues with hair thinning.  However would like to continue letrozole at this time.  She is also experiencing some nausea on and off.  No vomiting reported. -We suggested she take her letrozole at bedtime and also use Zofran for nausea. -Last mammogram done on 10/10/2019 showed B RADS category 3 probably benign.  Suggested repeat mammogram in 6 months. -She will follow-up in 6 months with repeat labs and mammogram.  2.  Bone density: -DEXA scan on 02/14/2017 showed T score of -0.2. -She was counseled to take vitamin D and calcium daily.  She is taking Tums 1000 mg daily as she is allergic to calcium from shells. -We plan to repeat a DEXA scan in 3 years from the last scan.      Orders placed this encounter:  Orders Placed This Encounter  Procedures  . DG Bone Density  . Lactate dehydrogenase  . CBC with Differential/Platelet  . Comprehensive metabolic panel  . Vitamin B12  . VITAMIN D 25 Hydroxy (Vit-D Deficiency, Fractures)  . Folate    I provided 22 minutes of non face-to-face telephone visit time during this encounter, and > 50% was spent counseling as documented under my assessment & plan.  Francene Finders, FNP-C [facility] [phone]

## 2020-01-01 DIAGNOSIS — M47812 Spondylosis without myelopathy or radiculopathy, cervical region: Secondary | ICD-10-CM | POA: Diagnosis not present

## 2020-01-01 DIAGNOSIS — M47816 Spondylosis without myelopathy or radiculopathy, lumbar region: Secondary | ICD-10-CM | POA: Diagnosis not present

## 2020-01-01 DIAGNOSIS — M5416 Radiculopathy, lumbar region: Secondary | ICD-10-CM | POA: Diagnosis not present

## 2020-01-01 DIAGNOSIS — G894 Chronic pain syndrome: Secondary | ICD-10-CM | POA: Diagnosis not present

## 2020-02-20 DIAGNOSIS — G894 Chronic pain syndrome: Secondary | ICD-10-CM | POA: Diagnosis not present

## 2020-02-20 DIAGNOSIS — E039 Hypothyroidism, unspecified: Secondary | ICD-10-CM | POA: Diagnosis not present

## 2020-02-20 DIAGNOSIS — R7309 Other abnormal glucose: Secondary | ICD-10-CM | POA: Diagnosis not present

## 2020-02-20 DIAGNOSIS — I10 Essential (primary) hypertension: Secondary | ICD-10-CM | POA: Diagnosis not present

## 2020-02-25 DIAGNOSIS — M47812 Spondylosis without myelopathy or radiculopathy, cervical region: Secondary | ICD-10-CM | POA: Diagnosis not present

## 2020-02-25 DIAGNOSIS — M5416 Radiculopathy, lumbar region: Secondary | ICD-10-CM | POA: Diagnosis not present

## 2020-02-25 DIAGNOSIS — M47816 Spondylosis without myelopathy or radiculopathy, lumbar region: Secondary | ICD-10-CM | POA: Diagnosis not present

## 2020-02-25 DIAGNOSIS — G894 Chronic pain syndrome: Secondary | ICD-10-CM | POA: Diagnosis not present

## 2020-04-08 ENCOUNTER — Inpatient Hospital Stay (HOSPITAL_COMMUNITY): Payer: Medicare Other | Attending: Hematology

## 2020-04-08 ENCOUNTER — Ambulatory Visit (HOSPITAL_COMMUNITY)
Admission: RE | Admit: 2020-04-08 | Discharge: 2020-04-08 | Disposition: A | Discharge status: Home / Self Care | Payer: Medicare Other | Source: Ambulatory Visit | Attending: Nurse Practitioner | Admitting: Nurse Practitioner

## 2020-04-08 ENCOUNTER — Other Ambulatory Visit: Payer: Self-pay

## 2020-04-08 DIAGNOSIS — C50412 Malignant neoplasm of upper-outer quadrant of left female breast: Secondary | ICD-10-CM | POA: Diagnosis not present

## 2020-04-08 DIAGNOSIS — Z13820 Encounter for screening for osteoporosis: Secondary | ICD-10-CM | POA: Diagnosis not present

## 2020-04-08 DIAGNOSIS — Z170 Estrogen receptor positive status [ER+]: Secondary | ICD-10-CM | POA: Diagnosis not present

## 2020-04-08 DIAGNOSIS — M85852 Other specified disorders of bone density and structure, left thigh: Secondary | ICD-10-CM | POA: Diagnosis not present

## 2020-04-08 LAB — CBC WITH DIFFERENTIAL/PLATELET
Abs Immature Granulocytes: 0.01 10*3/uL (ref 0.00–0.07)
Basophils Absolute: 0.1 10*3/uL (ref 0.0–0.1)
Basophils Relative: 1 %
Eosinophils Absolute: 0.1 10*3/uL (ref 0.0–0.5)
Eosinophils Relative: 2 %
HCT: 42.5 % (ref 36.0–46.0)
Hemoglobin: 13.9 g/dL (ref 12.0–15.0)
Immature Granulocytes: 0 %
Lymphocytes Relative: 31 %
Lymphs Abs: 1.8 10*3/uL (ref 0.7–4.0)
MCH: 32.2 pg (ref 26.0–34.0)
MCHC: 32.7 g/dL (ref 30.0–36.0)
MCV: 98.4 fL (ref 80.0–100.0)
Monocytes Absolute: 0.4 10*3/uL (ref 0.1–1.0)
Monocytes Relative: 7 %
Neutro Abs: 3.5 10*3/uL (ref 1.7–7.7)
Neutrophils Relative %: 59 %
Platelets: 222 10*3/uL (ref 150–400)
RBC: 4.32 MIL/uL (ref 3.87–5.11)
RDW: 12.9 % (ref 11.5–15.5)
WBC: 5.9 10*3/uL (ref 4.0–10.5)
nRBC: 0 % (ref 0.0–0.2)

## 2020-04-08 LAB — COMPREHENSIVE METABOLIC PANEL
ALT: 32 U/L (ref 0–44)
AST: 28 U/L (ref 15–41)
Albumin: 3.9 g/dL (ref 3.5–5.0)
Alkaline Phosphatase: 101 U/L (ref 38–126)
Anion gap: 8 (ref 5–15)
BUN: 18 mg/dL (ref 8–23)
CO2: 28 mmol/L (ref 22–32)
Calcium: 10.1 mg/dL (ref 8.9–10.3)
Chloride: 102 mmol/L (ref 98–111)
Creatinine, Ser: 0.9 mg/dL (ref 0.44–1.00)
GFR calc Af Amer: >60 mL/min (ref >60)
GFR calc non Af Amer: >60 mL/min (ref >60)
Glucose, Bld: 103 mg/dL — ABNORMAL HIGH (ref 70–99)
Potassium: 4.1 mmol/L (ref 3.5–5.1)
Sodium: 138 mmol/L (ref 135–145)
Total Bilirubin: 0.8 mg/dL (ref 0.3–1.2)
Total Protein: 7.2 g/dL (ref 6.5–8.1)

## 2020-04-08 LAB — LACTATE DEHYDROGENASE: LDH: 166 U/L (ref 98–192)

## 2020-04-09 ENCOUNTER — Ambulatory Visit
Admission: RE | Admit: 2020-04-09 | Discharge: 2020-04-09 | Disposition: A | Discharge status: Home / Self Care | Payer: Medicare Other | Source: Ambulatory Visit | Attending: Obstetrics & Gynecology | Admitting: Obstetrics & Gynecology

## 2020-04-09 ENCOUNTER — Other Ambulatory Visit: Payer: Self-pay

## 2020-04-09 ENCOUNTER — Other Ambulatory Visit: Payer: Self-pay | Admitting: Obstetrics & Gynecology

## 2020-04-09 DIAGNOSIS — N6320 Unspecified lump in the left breast, unspecified quadrant: Secondary | ICD-10-CM

## 2020-04-09 DIAGNOSIS — R928 Other abnormal and inconclusive findings on diagnostic imaging of breast: Secondary | ICD-10-CM | POA: Diagnosis not present

## 2020-04-09 DIAGNOSIS — Z853 Personal history of malignant neoplasm of breast: Secondary | ICD-10-CM

## 2020-04-15 ENCOUNTER — Telehealth (HOSPITAL_COMMUNITY): Payer: Medicare Other | Admitting: Nurse Practitioner

## 2020-04-22 DIAGNOSIS — M5416 Radiculopathy, lumbar region: Secondary | ICD-10-CM | POA: Diagnosis not present

## 2020-04-22 DIAGNOSIS — G894 Chronic pain syndrome: Secondary | ICD-10-CM | POA: Diagnosis not present

## 2020-04-22 DIAGNOSIS — M47816 Spondylosis without myelopathy or radiculopathy, lumbar region: Secondary | ICD-10-CM | POA: Diagnosis not present

## 2020-04-22 DIAGNOSIS — M47812 Spondylosis without myelopathy or radiculopathy, cervical region: Secondary | ICD-10-CM | POA: Diagnosis not present

## 2020-04-23 ENCOUNTER — Inpatient Hospital Stay (HOSPITAL_COMMUNITY): Payer: Medicare Other | Attending: Nurse Practitioner | Admitting: Nurse Practitioner

## 2020-04-23 DIAGNOSIS — C50412 Malignant neoplasm of upper-outer quadrant of left female breast: Secondary | ICD-10-CM | POA: Diagnosis not present

## 2020-04-23 DIAGNOSIS — Z170 Estrogen receptor positive status [ER+]: Secondary | ICD-10-CM | POA: Diagnosis not present

## 2020-04-23 NOTE — Progress Notes (Signed)
[facility]:    Tracy Sites, MD [address] [place] [address]   DIAGNOSIS: Breast cancer   Cancer Staging Breast cancer of upper-outer quadrant of left female breast [facility]) Staging form: Breast, AJCC 7th Edition - Pathologic: Stage IA (T1c, N0, cM0) - Unsigned - Pathologic: No stage assigned - Unsigned   SUMMARY OF ONCOLOGIC HISTORY: Oncology History  Breast cancer of upper-outer quadrant of left female breast ([place])  07/26/2016 Mammogram   Area of developing asymmetry with distortion located within the upper-outer quadrant left breast. Tissue sampling via tomosynthesis guided biopsy is recommended and will be scheduled.  RECOMMENDATION: Left breast tomosynthesis guided biopsy. This has been scheduled for 07/28/2016.    07/28/2016 Initial Biopsy   Coil shaped clip corresponds to the asymmetry in the outer left breast. Clip is well positioned at the biopsy site. 2. X shaped clip corresponds to the ASYMMETRY/DISTORTION in the upper-outer quadrant of the left breast. Clip is positioned approximately 1 cm medial to the center of the biopsy cavity   07/28/2016 Pathology Results   Estrogen Receptor: 80%, POSITIVE, MODERATE STAINING INTENSITY Progesterone Receptor: 0%, NEGATIVE Proliferation Marker Ki67: 70% HER 2 negative by FISH Breast, left, needle core biopsy, upper outer quadrant - INVASIVE DUCTAL CARCINOMA, SEE COMMENT. - HIGH GRADE DUCTAL CARCINOMA IN SITU WITH NECROSIS.   08/24/2016 Oncotype testing   Recurrence Score Result 30, 10 year risk of distant recurrence Tamoxifen alone 20%   10/03/2016 - 12/05/2016 Chemotherapy   The patient had palonosetron (ALOXI) injection 0.25 mg, 0.25 mg, Intravenous,  Once, 2 of 4 cycles Administration: 0.25 mg (10/24/2016)  pegfilgrastim (NEULASTA ONPRO KIT) injection 6 mg, 6 mg, Subcutaneous, Once, 2 of 4 cycles Administration: 6 mg (10/24/2016)  cyclophosphamide (CYTOXAN) 1,380 mg in sodium  chloride 0.9 % 250 mL chemo infusion, 600 mg/m2 = 1,380 mg, Intravenous,  Once, 2 of 4 cycles Administration: 1,380 mg (10/24/2016)  DOCEtaxel (TAXOTERE) 170 mg in dextrose 5 % 250 mL chemo infusion, 75 mg/m2 = 170 mg, Intravenous,  Once, 2 of 4 cycles Dose modification: 65 mg/m2 (original dose 75 mg/m2, Cycle 2, Reason: Dose not tolerated)  fosaprepitant (EMEND) 150 mg, dexamethasone (DECADRON) 12 mg in sodium chloride 0.9 % 145 mL IVPB, , Intravenous,  Once, 1 of 3 cycles Administration:  (10/24/2016)  for chemotherapy treatment.      - 02/06/2017 Radiation Therapy   Completed adjuvant breast RT    02/26/2017 -  Anti-estrogen oral therapy   Started anastrozole   03/05/2017 Pathology Results   BCI-high risk of late recurrence (years 5-10) at 10.6% with a high likelihood of benefit from extended endocrine therapy.  High overall risk of recurrence (years 0-10) 19.5% and a high likelihood of benefit from extended endocrine therapy.   11/14/2017 Genetic Testing   Negative genetic testing on the Multi-cancer panel.  The Multi-Gene Panel offered by [facility] includes sequencing and/or deletion duplication testing of the following 83 genes: ALK, APC, ATM, AXIN2,BAP1,  BARD1, BLM, BMPR1A, BRCA1, BRCA2, BRIP1, CASR, CDC73, CDH1, CDK4, CDKN1B, CDKN1C, CDKN2A (p14ARF), CDKN2A (p16INK4a), CEBPA, CHEK2, CTNNA1, DICER1, DIS3L2, EGFR (c.2369C>T, p.Thr790Met variant only), EPCAM (Deletion/duplication testing only), FH, FLCN, GATA2, GPC3, GREM1 (Promoter region deletion/duplication testing only), HOXB13 (c.251G>A, p.Gly84Glu), HRAS, KIT, MAX, MEN1, MET, MITF (c.952G>A, p.Glu318Lys variant only), MLH1, MSH2, MSH3, MSH6, MUTYH, NBN, NF1, NF2, NTHL1, PALB2, PDGFRA, PHOX2B, PMS2, POLD1, POLE, POT1, PRKAR1A, PTCH1, PTEN, RAD50, RAD51C, RAD51D, RB1, RECQL4, RET, RUNX1, SDHAF2, SDHA (sequence changes only), SDHB, SDHC, SDHD, SMAD4, SMARCA4,  SMARCB1, SMARCE1, STK11, SUFU, TERT, TERT, TMEM127, TP53, TSC1, TSC2, VHL, WRN and WT1.  The report date is November 14, 2017.      CURRENT THERAPY:  INTERVAL HISTORY: Tracy Valentine 64 y.o. female was called for a telephone visit for breast cancer.  Patient reports she is doing well since her last visit.  She denies any lumps or bumps present.  She is taking her medication as prescribed.  She does report some occasional nausea.  She started taking at bedtime and it is slightly improved. Denies any nausea, vomiting, or diarrhea. Denies any new pains. Had not noticed any recent bleeding such as epistaxis, hematuria or hematochezia. Denies recent chest pain on exertion, shortness of breath on minimal exertion, pre-syncopal episodes, or palpitations. Denies any numbness or tingling in hands or feet. Denies any recent fevers, infections, or recent hospitalizations. Patient reports appetite at 100% and energy level at 100%.    Patient Active Problem List   Diagnosis Date Noted  . Bradycardia 09/23/2018  . Morbid obesity ([place]) 09/23/2018  . Genetic testing 11/16/2017  . Family history of ovarian cancer   . Family history of uterine cancer   . Dysuria 10/09/2016  . Nausea without vomiting 10/09/2016  . Skin infection 10/09/2016  . Breast cancer of upper-outer quadrant of left female breast ([place]) 08/27/2016  . HTN (hypertension) 07/25/2013  . Hypothyroidism 07/25/2013  . Anxiety 07/25/2013  . Chest pain 07/25/2013    is allergic to adhesive [tape], amoxicillin, darvocet [propoxyphene n-acetaminophen], doxycycline, erythromycin, shellfish allergy, zithromax [azithromycin], and latex.  MEDICAL HISTORY: Past Medical History:  Diagnosis Date  . Anxiety   . Breast cancer ([place]) 2017   Left Breast  . Chronic pain   . DDD (degenerative disc disease), cervical   . DDD (degenerative disc disease), lumbar   . Degenerative disc disease at L5-S1 level   . Depression   . GERD (gastroesophageal reflux disease)   . History of kidney stones   . Hyperlipidemia   . Hypothyroidism    . Obesity   . Osteoarthritis   . Ovarian cancer ([place]) 2017   Left Breast Cancer  . Personal history of chemotherapy 2017   Left Breast Cancer  . PONV (postoperative nausea and vomiting)   . Sleep apnea    uses CPAP  . Spinal stenosis     SURGICAL HISTORY: Past Surgical History:  Procedure Laterality Date  . ABDOMINAL HYSTERECTOMY     partial hyst, still have ovaries  . BREAST LUMPECTOMY Left 08/24/2016  . BREAST LUMPECTOMY WITH RADIOACTIVE SEED AND SENTINEL LYMPH NODE BIOPSY Left 08/24/2016   Procedure: LEFT BREAST PARTIAL MASTECTOMY WITH RADIOACTIVE SEED AND LEFT SENTINEL LYMPH NODE MAPPING;  Surgeon: Erroll Luna, MD;  Location: [facility];  Service: General;  Laterality: Left;  . CHOLECYSTECTOMY    . FOOT FOREIGN BODY REMOVAL Left   . PORT-A-CATH REMOVAL N/A 03/06/2018   Procedure: MINOR REMOVAL PORT-A-CATH;  Surgeon: Aviva Signs, MD;  Location: [facility];  Service: General;  Laterality: N/A;  . PORTACATH PLACEMENT Right 09/29/2016   Procedure: INSERTION PORT-A-CATH RIGHT SUBCLAVIAN;  Surgeon: Aviva Signs, MD;  Location: [facility];  Service: General;  Laterality: Right;    SOCIAL HISTORY: Social History   Socioeconomic History  . Marital status: Married    Spouse name: Not on file  . Number of children: Not on file  . Years of education: Not on file  . Highest education level: Not on file  Occupational History  . Not on  file  Tobacco Use  . Smoking status: Never Smoker  . Smokeless tobacco: Never Used  Vaping Use  . Vaping Use: Never used  Substance and Sexual Activity  . Alcohol use: No  . Drug use: No  . Sexual activity: Yes    Birth control/protection: None    Comment: married-1 grown son  Other Topics Concern  . Not on file  Social History Narrative   Lives with husband.  One child.     Social Determinants of Health   Financial Resource Strain:   . Difficulty of Paying Living Expenses:   Food Insecurity:   . Worried About Sales executive in the Last Year:   . Arboriculturist in the Last Year:   Transportation Needs:   . Film/video editor (Medical):   [name] Kitchen Lack of Transportation (Non-Medical):   Physical Activity:   . Days of Exercise per Week:   . Minutes of Exercise per Session:   Stress:   . Feeling of Stress :   Social Connections:   . Frequency of Communication with Friends and Family:   . Frequency of Social Gatherings with Friends and Family:   . Attends Religious Services:   . Active Member of Clubs or Organizations:   . Attends Archivist Meetings:   [name] Kitchen Marital Status:   Intimate Partner Violence:   . Fear of Current or Ex-Partner:   . Emotionally Abused:   [name] Kitchen Physically Abused:   . Sexually Abused:     FAMILY HISTORY: Family History  Problem Relation Age of Onset  . COPD Mother   . Ovarian cancer Mother 24  . Uterine cancer Mother 31  . Heart attack Father 66  . Diabetes Sister   . Heart disease Sister   . Kidney cancer Brother 38  . Breast cancer Cousin   . Kidney failure Sister   . Thyroid disease Sister   . Ovarian cancer Maternal Grandmother 32  . Cancer Paternal Aunt        NOS  . Cancer Paternal Uncle        NOS  . Colon cancer Other        2 distant cousins with colon cancer in their 70s    Review of Systems  All other systems reviewed and are negative.   Vital signs: -Deferred due to telephone visit  Physical Exam -Deferred due to telephone visit -Patient was alert and oriented over the phone and in no acute distress.   LABORATORY DATA:  CBC    Component Value Date/Time   WBC 5.9 04/08/2020 1322   RBC 4.32 04/08/2020 1322   HGB 13.9 04/08/2020 1322   HCT 42.5 04/08/2020 1322   PLT 222 04/08/2020 1322   MCV 98.4 04/08/2020 1322   MCH 32.2 04/08/2020 1322   MCHC 32.7 04/08/2020 1322   RDW 12.9 04/08/2020 1322   LYMPHSABS 1.8 04/08/2020 1322   MONOABS 0.4 04/08/2020 1322   EOSABS 0.1 04/08/2020 1322   BASOSABS 0.1 04/08/2020 1322    CMP      Component Value Date/Time   NA 138 04/08/2020 1322   K 4.1 04/08/2020 1322   CL 102 04/08/2020 1322   CO2 28 04/08/2020 1322   GLUCOSE 103 (H) 04/08/2020 1322   BUN 18 04/08/2020 1322   CREATININE 0.90 04/08/2020 1322   CALCIUM 10.1 04/08/2020 1322   PROT 7.2 04/08/2020 1322   ALBUMIN 3.9 04/08/2020 1322   AST 28 04/08/2020 1322   ALT  32 04/08/2020 1322   ALKPHOS 101 04/08/2020 1322   BILITOT 0.8 04/08/2020 1322   GFRNONAA >60 04/08/2020 1322   GFRAA >60 04/08/2020 1322   All questions were answered to patient's stated satisfaction. Encouraged patient to call with any new concerns or questions before his next visit to the cancer center and we can certain see him sooner, if needed.     ASSESSMENT and THERAPY PLAN:   Breast cancer of upper-outer quadrant of left female breast ([place]) 1.  Left breast cancer: -Status post lumpectomy on 08/24/2016, pathology showing 1.9 cm, grade 3, 0 out of 4 lymph nodes positive, margins negative, Ki 67 of 70%, ER positive, PR/HER-2 negative. -Oncotype DX recurrence score 30, BCI high risk, INVITAE test on 11/14/2017 - for hereditary breast cancer. -4 cycles of adjuvant TC from 09/25/2016 through 12/05/2016. -Anastrozole started February 2018, switch to letrozole in February 2019 secondary to hair thinning. -She is continuing to have issues with hair thinning.  However would like to continue letrozole at this time.  She is also experiencing some nausea on and off.  No vomiting reported. -We suggested she take her letrozole at bedtime and also use Zofran for nausea. -Last mammogram done on 04/10/2019 1B RADS category 2 benign -She will follow-up in 6 months with repeat labs.  2.  Bone density: -DEXA scan on 04/08/2020 showed T score of -1.3 -She was counseled to take vitamin D and calcium daily.  She is taking Tums 1000 mg daily as she is allergic to calcium from shells. -We plan to repeat a DEXA scan in 3 years from the last scan.   Orders Placed This  Encounter  Procedures  . Lactate dehydrogenase    Standing Status:   Future    Standing Expiration Date:   04/23/2021  . CBC with Differential/Platelet    Standing Status:   Future    Standing Expiration Date:   04/23/2021  . Comprehensive metabolic panel    Standing Status:   Future    Standing Expiration Date:   04/23/2021  . Vitamin B12    Standing Status:   Future    Standing Expiration Date:   04/23/2021  . VITAMIN D 25 Hydroxy (Vit-D Deficiency, Fractures)    Standing Status:   Future    Standing Expiration Date:   04/23/2021    All questions were answered. The patient knows to call the clinic with any problems, questions or concerns. We can certainly see the patient much sooner if necessary. This note was electronically signed.  I provided 30 minutes of non face-to-face telephone visit time during this encounter, and > 50% was spent counseling as documented under my assessment & plan.   Glennie Isle, NP-C 04/23/2020

## 2020-04-23 NOTE — Assessment & Plan Note (Addendum)
1.  Left breast cancer: -Status post lumpectomy on 08/24/2016, pathology showing 1.9 cm, grade 3, 0 out of 4 lymph nodes positive, margins negative, Ki 67 of 70%, ER positive, PR/HER-2 negative. -Oncotype DX recurrence score 30, BCI high risk, INVITAE test on 11/14/2017 - for hereditary breast cancer. -4 cycles of adjuvant TC from 09/25/2016 through 12/05/2016. -Anastrozole started February 2018, switch to letrozole in February 2019 secondary to hair thinning. -She is continuing to have issues with hair thinning.  However would like to continue letrozole at this time.  She is also experiencing some nausea on and off.  No vomiting reported. -We suggested she take her letrozole at bedtime and also use Zofran for nausea. -Last mammogram done on 04/10/2019 1B RADS category 2 benign -She will follow-up in 6 months with repeat labs.  2.  Bone density: -DEXA scan on 04/08/2020 showed T score of -1.3 -She was counseled to take vitamin D and calcium daily.  She is taking Tums 1000 mg daily as she is allergic to calcium from shells. -We plan to repeat a DEXA scan in 3 years from the last scan.

## 2020-05-03 DIAGNOSIS — Z79811 Long term (current) use of aromatase inhibitors: Secondary | ICD-10-CM | POA: Diagnosis not present

## 2020-05-03 DIAGNOSIS — Z853 Personal history of malignant neoplasm of breast: Secondary | ICD-10-CM | POA: Diagnosis not present

## 2020-05-03 DIAGNOSIS — Z923 Personal history of irradiation: Secondary | ICD-10-CM | POA: Diagnosis not present

## 2020-05-03 DIAGNOSIS — Z08 Encounter for follow-up examination after completed treatment for malignant neoplasm: Secondary | ICD-10-CM | POA: Diagnosis not present

## 2020-05-03 DIAGNOSIS — Z170 Estrogen receptor positive status [ER+]: Secondary | ICD-10-CM | POA: Diagnosis not present

## 2020-05-03 DIAGNOSIS — C50412 Malignant neoplasm of upper-outer quadrant of left female breast: Secondary | ICD-10-CM | POA: Diagnosis not present

## 2020-05-19 ENCOUNTER — Other Ambulatory Visit (HOSPITAL_COMMUNITY): Payer: Self-pay | Admitting: Nurse Practitioner

## 2020-05-19 DIAGNOSIS — R110 Nausea: Secondary | ICD-10-CM

## 2020-06-16 DIAGNOSIS — M5416 Radiculopathy, lumbar region: Secondary | ICD-10-CM | POA: Diagnosis not present

## 2020-06-16 DIAGNOSIS — G894 Chronic pain syndrome: Secondary | ICD-10-CM | POA: Diagnosis not present

## 2020-06-16 DIAGNOSIS — M47816 Spondylosis without myelopathy or radiculopathy, lumbar region: Secondary | ICD-10-CM | POA: Diagnosis not present

## 2020-06-16 DIAGNOSIS — M47812 Spondylosis without myelopathy or radiculopathy, cervical region: Secondary | ICD-10-CM | POA: Diagnosis not present

## 2020-08-11 DIAGNOSIS — G894 Chronic pain syndrome: Secondary | ICD-10-CM | POA: Diagnosis not present

## 2020-08-11 DIAGNOSIS — M5416 Radiculopathy, lumbar region: Secondary | ICD-10-CM | POA: Diagnosis not present

## 2020-08-11 DIAGNOSIS — M47816 Spondylosis without myelopathy or radiculopathy, lumbar region: Secondary | ICD-10-CM | POA: Diagnosis not present

## 2020-08-11 DIAGNOSIS — M47812 Spondylosis without myelopathy or radiculopathy, cervical region: Secondary | ICD-10-CM | POA: Diagnosis not present

## 2020-10-12 DIAGNOSIS — M5416 Radiculopathy, lumbar region: Secondary | ICD-10-CM | POA: Diagnosis not present

## 2020-10-12 DIAGNOSIS — M47816 Spondylosis without myelopathy or radiculopathy, lumbar region: Secondary | ICD-10-CM | POA: Diagnosis not present

## 2020-10-12 DIAGNOSIS — M47812 Spondylosis without myelopathy or radiculopathy, cervical region: Secondary | ICD-10-CM | POA: Diagnosis not present

## 2020-10-12 DIAGNOSIS — G894 Chronic pain syndrome: Secondary | ICD-10-CM | POA: Diagnosis not present

## 2020-10-13 ENCOUNTER — Other Ambulatory Visit: Payer: Self-pay | Admitting: Obstetrics & Gynecology

## 2020-10-13 ENCOUNTER — Ambulatory Visit
Admission: RE | Admit: 2020-10-13 | Discharge: 2020-10-13 | Disposition: A | Discharge status: Home / Self Care | Payer: Medicare Other | Source: Ambulatory Visit | Attending: Obstetrics & Gynecology | Admitting: Obstetrics & Gynecology

## 2020-10-13 ENCOUNTER — Other Ambulatory Visit: Payer: Self-pay

## 2020-10-13 DIAGNOSIS — Z853 Personal history of malignant neoplasm of breast: Secondary | ICD-10-CM

## 2020-10-13 DIAGNOSIS — R928 Other abnormal and inconclusive findings on diagnostic imaging of breast: Secondary | ICD-10-CM | POA: Diagnosis not present

## 2020-10-13 DIAGNOSIS — R921 Mammographic calcification found on diagnostic imaging of breast: Secondary | ICD-10-CM

## 2020-10-20 ENCOUNTER — Ambulatory Visit
Admission: RE | Admit: 2020-10-20 | Discharge: 2020-10-20 | Disposition: A | Discharge status: Home / Self Care | Payer: Medicare Other | Source: Ambulatory Visit | Attending: Obstetrics & Gynecology | Admitting: Obstetrics & Gynecology

## 2020-10-20 ENCOUNTER — Other Ambulatory Visit: Payer: Self-pay

## 2020-10-20 ENCOUNTER — Other Ambulatory Visit: Payer: Self-pay | Admitting: Obstetrics & Gynecology

## 2020-10-20 DIAGNOSIS — R921 Mammographic calcification found on diagnostic imaging of breast: Secondary | ICD-10-CM

## 2020-10-20 DIAGNOSIS — N6032 Fibrosclerosis of left breast: Secondary | ICD-10-CM | POA: Diagnosis not present

## 2020-10-20 HISTORY — PX: BREAST BIOPSY: SHX20

## 2020-10-29 ENCOUNTER — Inpatient Hospital Stay (HOSPITAL_COMMUNITY): Payer: Medicare Other

## 2020-11-04 ENCOUNTER — Telehealth (HOSPITAL_COMMUNITY): Payer: Medicare Other | Admitting: Oncology

## 2020-11-05 ENCOUNTER — Telehealth (HOSPITAL_COMMUNITY): Payer: Medicare Other | Admitting: Nurse Practitioner

## 2020-11-10 ENCOUNTER — Other Ambulatory Visit (HOSPITAL_COMMUNITY): Payer: Self-pay

## 2020-11-10 DIAGNOSIS — R110 Nausea: Secondary | ICD-10-CM

## 2020-11-10 MED ORDER — ONDANSETRON HCL 4 MG PO TABS: ORAL_TABLET | ORAL | 3 refills | Status: DC

## 2020-11-16 ENCOUNTER — Telehealth (HOSPITAL_COMMUNITY): Payer: Self-pay | Admitting: Surgery

## 2020-11-16 NOTE — Telephone Encounter (Signed)
Pt had called to let our office know that she tested positive for Covid today, and she wanted to know if Dr. Delton Coombes would write a prescription for the oral medication to help with the symptoms of Covid before the five day period expires.   Per Dr. Delton Coombes, the pt needs to call her primary MD or urgent care to see if she can get a prescription.  I called the pt back and she verbalized understanding of these instructions.

## 2020-11-17 ENCOUNTER — Other Ambulatory Visit (HOSPITAL_COMMUNITY): Payer: Self-pay | Admitting: *Deleted

## 2020-11-18 ENCOUNTER — Telehealth: Payer: Self-pay

## 2020-11-18 NOTE — Telephone Encounter (Signed)
Called pt cell phone LVM for return call. Called pt home number, rang no option to leave message.

## 2020-11-18 NOTE — Telephone Encounter (Signed)
[place] received call from [place] at [facility] calling for Dr. Lavell Anchors stating pt tested positive 11/15/20. Diane states provider wants to refer pt for infusions treatment. Informed Diane provider needs to place order [phone] Ambulatory Referral for COVID Treatment to have pt placed on treatment list.  Upon recheck fu of progress today it appears the referral was placed and canceled. Will fu with patient today

## 2020-12-02 ENCOUNTER — Other Ambulatory Visit (HOSPITAL_COMMUNITY): Payer: Self-pay | Admitting: *Deleted

## 2020-12-02 DIAGNOSIS — C50412 Malignant neoplasm of upper-outer quadrant of left female breast: Secondary | ICD-10-CM

## 2020-12-02 DIAGNOSIS — Z170 Estrogen receptor positive status [ER+]: Secondary | ICD-10-CM

## 2020-12-03 ENCOUNTER — Inpatient Hospital Stay (HOSPITAL_COMMUNITY): Payer: Medicare Other | Attending: Hematology

## 2020-12-07 DIAGNOSIS — M5416 Radiculopathy, lumbar region: Secondary | ICD-10-CM | POA: Diagnosis not present

## 2020-12-07 DIAGNOSIS — M47812 Spondylosis without myelopathy or radiculopathy, cervical region: Secondary | ICD-10-CM | POA: Diagnosis not present

## 2020-12-07 DIAGNOSIS — G894 Chronic pain syndrome: Secondary | ICD-10-CM | POA: Diagnosis not present

## 2020-12-07 DIAGNOSIS — M47816 Spondylosis without myelopathy or radiculopathy, lumbar region: Secondary | ICD-10-CM | POA: Diagnosis not present

## 2020-12-09 ENCOUNTER — Other Ambulatory Visit: Payer: Self-pay

## 2020-12-09 ENCOUNTER — Inpatient Hospital Stay (HOSPITAL_COMMUNITY): Payer: Medicare Other | Attending: Hematology

## 2020-12-09 DIAGNOSIS — Z170 Estrogen receptor positive status [ER+]: Secondary | ICD-10-CM | POA: Insufficient documentation

## 2020-12-09 DIAGNOSIS — C50412 Malignant neoplasm of upper-outer quadrant of left female breast: Secondary | ICD-10-CM | POA: Insufficient documentation

## 2020-12-09 LAB — COMPREHENSIVE METABOLIC PANEL
ALT: 67 U/L — ABNORMAL HIGH (ref 0–44)
AST: 48 U/L — ABNORMAL HIGH (ref 15–41)
Albumin: 3.8 g/dL (ref 3.5–5.0)
Alkaline Phosphatase: 126 U/L (ref 38–126)
Anion gap: 6 (ref 5–15)
BUN: 18 mg/dL (ref 8–23)
CO2: 27 mmol/L (ref 22–32)
Calcium: 10.1 mg/dL (ref 8.9–10.3)
Chloride: 103 mmol/L (ref 98–111)
Creatinine, Ser: 0.82 mg/dL (ref 0.44–1.00)
GFR, Estimated: >60 mL/min (ref >60)
Glucose, Bld: 160 mg/dL — ABNORMAL HIGH (ref 70–99)
Potassium: 3.8 mmol/L (ref 3.5–5.1)
Sodium: 136 mmol/L (ref 135–145)
Total Bilirubin: 0.8 mg/dL (ref 0.3–1.2)
Total Protein: 6.6 g/dL (ref 6.5–8.1)

## 2020-12-09 LAB — CBC WITH DIFFERENTIAL/PLATELET
Abs Immature Granulocytes: 0.01 10*3/uL (ref 0.00–0.07)
Basophils Absolute: 0 10*3/uL (ref 0.0–0.1)
Basophils Relative: 1 %
Eosinophils Absolute: 0.2 10*3/uL (ref 0.0–0.5)
Eosinophils Relative: 3 %
HCT: 41.2 % (ref 36.0–46.0)
Hemoglobin: 13.6 g/dL (ref 12.0–15.0)
Immature Granulocytes: 0 %
Lymphocytes Relative: 30 %
Lymphs Abs: 1.7 10*3/uL (ref 0.7–4.0)
MCH: 32.9 pg (ref 26.0–34.0)
MCHC: 33 g/dL (ref 30.0–36.0)
MCV: 99.8 fL (ref 80.0–100.0)
Monocytes Absolute: 0.4 10*3/uL (ref 0.1–1.0)
Monocytes Relative: 7 %
Neutro Abs: 3.4 10*3/uL (ref 1.7–7.7)
Neutrophils Relative %: 59 %
Platelets: 203 10*3/uL (ref 150–400)
RBC: 4.13 MIL/uL (ref 3.87–5.11)
RDW: 12.7 % (ref 11.5–15.5)
WBC: 5.6 10*3/uL (ref 4.0–10.5)
nRBC: 0 % (ref 0.0–0.2)

## 2020-12-09 LAB — VITAMIN D 25 HYDROXY (VIT D DEFICIENCY, FRACTURES): Vit D, 25-Hydroxy: 30.59 ng/mL (ref 30–100)

## 2020-12-09 LAB — LACTATE DEHYDROGENASE: LDH: 157 U/L (ref 98–192)

## 2020-12-09 LAB — VITAMIN B12: Vitamin B-12: 368 pg/mL (ref 180–914)

## 2020-12-10 ENCOUNTER — Other Ambulatory Visit (HOSPITAL_COMMUNITY): Payer: Self-pay

## 2020-12-10 DIAGNOSIS — Z170 Estrogen receptor positive status [ER+]: Secondary | ICD-10-CM

## 2020-12-10 DIAGNOSIS — C50412 Malignant neoplasm of upper-outer quadrant of left female breast: Secondary | ICD-10-CM

## 2020-12-10 MED ORDER — LETROZOLE 2.5 MG PO TABS: 2.5000 mg | ORAL_TABLET | Freq: Every day | ORAL | 3 refills | Status: DC

## 2020-12-13 ENCOUNTER — Encounter (HOSPITAL_COMMUNITY): Payer: Self-pay | Admitting: Hematology

## 2020-12-13 ENCOUNTER — Inpatient Hospital Stay (HOSPITAL_BASED_OUTPATIENT_CLINIC_OR_DEPARTMENT_OTHER): Payer: Medicare Other | Admitting: Hematology

## 2020-12-13 DIAGNOSIS — C50412 Malignant neoplasm of upper-outer quadrant of left female breast: Secondary | ICD-10-CM

## 2020-12-13 DIAGNOSIS — Z170 Estrogen receptor positive status [ER+]: Secondary | ICD-10-CM

## 2020-12-13 MED ORDER — BELSOMRA 10 MG PO TABS: 10.0000 mg | ORAL_TABLET | Freq: Every evening | ORAL | 0 refills | Status: DC | PRN

## 2020-12-13 NOTE — Progress Notes (Signed)
Virtual Visit via Telephone Note  I connected with Tracy Valentine on 12/13/20 at  4:15 PM EST by telephone and verified that I am speaking with the correct person using two identifiers.  Location: Patient: At home Provider: In the office   I discussed the limitations, risks, security and privacy concerns of performing an evaluation and management service by telephone and the availability of in person appointments. I also discussed with the patient that there may be a patient responsible charge related to this service. The patient expressed understanding and agreed to proceed.   History of Present Illness: She is seen in our clinic for left breast cancer.  She has received 4 cycles of adjuvant TC from 09/25/2016 through 10/04/2017.  She is currently taking letrozole due to better tolerance.   Observations/Objective: She reports that she has not been missing letrozole.  Appetite is 100%.  Energy levels are 25%.  Reports severe difficulty with sleeping.  She recently tried Ambien 10 mg at bedtime but did not help.  She used to take trazodone prior to that.  She reports about a 30 pound weight gain in the last few months.  She reports lack of exercise.  Chronic constipation is stable.  Assessment and Plan:  1.  Left breast cancer: -Reviewed labs from 12/09/2020.  AST and ALT are elevated at 48 and 67 respectively. -Possible causes include drug-induced versus fatty liver from weight gain.  Only new medication was Nurtec.  She has been on Zocor for a long time. -I have encouraged her to repeat the LFTs with Dr. Hilma Favors when she sees him on 12/28/2020. -Last mammogram on 10/13/2020 showed stable postlumpectomy changes on the left side.  6 mm group of indeterminate calcifications at the lumpectomy site in the upper outer left breast. -Reviewed results of left breast upper outer quadrant biopsy from 10/20/2020 which showed fat necrosis with fibrosis and grade calcifications and negative for  cancer. -She will have mammogram again in 1 year. -Continue letrozole at this time. -RTC 6 months in person for follow-up.  2.  Bone health: -DEXA scan on 04/08/2020 with T score of -1.3. -Continue Tums 1000 mg daily.  Vitamin D level was normal at 30.59.  3.  Inability to sleep: -She has tried Ambien and trazodone without help. -She reportedly took Xanax in the past which helped. -I will send a prescription for Belsomra 10 mg daily as needed.   Follow Up Instructions: RTC 6 months with labs.   I discussed the assessment and treatment plan with the patient. The patient was provided an opportunity to ask questions and all were answered. The patient agreed with the plan and demonstrated an understanding of the instructions.   The patient was advised to call back or seek an in-person evaluation if the symptoms worsen or if the condition fails to improve as anticipated.  I provided 21 minutes of non-face-to-face time during this encounter.   Derek Jack, MD

## 2020-12-23 DIAGNOSIS — M7731 Calcaneal spur, right foot: Secondary | ICD-10-CM | POA: Diagnosis not present

## 2020-12-23 DIAGNOSIS — M7732 Calcaneal spur, left foot: Secondary | ICD-10-CM | POA: Diagnosis not present

## 2020-12-23 DIAGNOSIS — M7661 Achilles tendinitis, right leg: Secondary | ICD-10-CM | POA: Diagnosis not present

## 2020-12-23 DIAGNOSIS — M722 Plantar fascial fibromatosis: Secondary | ICD-10-CM | POA: Diagnosis not present

## 2020-12-28 DIAGNOSIS — R319 Hematuria, unspecified: Secondary | ICD-10-CM | POA: Diagnosis not present

## 2020-12-28 DIAGNOSIS — N390 Urinary tract infection, site not specified: Secondary | ICD-10-CM | POA: Diagnosis not present

## 2021-01-10 DIAGNOSIS — M79671 Pain in right foot: Secondary | ICD-10-CM | POA: Diagnosis not present

## 2021-01-10 DIAGNOSIS — M7732 Calcaneal spur, left foot: Secondary | ICD-10-CM | POA: Diagnosis not present

## 2021-01-10 DIAGNOSIS — M722 Plantar fascial fibromatosis: Secondary | ICD-10-CM | POA: Diagnosis not present

## 2021-01-10 DIAGNOSIS — M7731 Calcaneal spur, right foot: Secondary | ICD-10-CM | POA: Diagnosis not present

## 2021-01-31 DIAGNOSIS — M722 Plantar fascial fibromatosis: Secondary | ICD-10-CM | POA: Diagnosis not present

## 2021-01-31 DIAGNOSIS — M7732 Calcaneal spur, left foot: Secondary | ICD-10-CM | POA: Diagnosis not present

## 2021-01-31 DIAGNOSIS — M7731 Calcaneal spur, right foot: Secondary | ICD-10-CM | POA: Diagnosis not present

## 2021-01-31 DIAGNOSIS — M79671 Pain in right foot: Secondary | ICD-10-CM | POA: Diagnosis not present

## 2021-02-01 DIAGNOSIS — M47816 Spondylosis without myelopathy or radiculopathy, lumbar region: Secondary | ICD-10-CM | POA: Diagnosis not present

## 2021-02-01 DIAGNOSIS — M5416 Radiculopathy, lumbar region: Secondary | ICD-10-CM | POA: Diagnosis not present

## 2021-02-01 DIAGNOSIS — G894 Chronic pain syndrome: Secondary | ICD-10-CM | POA: Diagnosis not present

## 2021-02-01 DIAGNOSIS — M47812 Spondylosis without myelopathy or radiculopathy, cervical region: Secondary | ICD-10-CM | POA: Diagnosis not present

## 2021-03-29 DIAGNOSIS — M47816 Spondylosis without myelopathy or radiculopathy, lumbar region: Secondary | ICD-10-CM | POA: Diagnosis not present

## 2021-03-29 DIAGNOSIS — M47812 Spondylosis without myelopathy or radiculopathy, cervical region: Secondary | ICD-10-CM | POA: Diagnosis not present

## 2021-03-29 DIAGNOSIS — G894 Chronic pain syndrome: Secondary | ICD-10-CM | POA: Diagnosis not present

## 2021-03-29 DIAGNOSIS — M5416 Radiculopathy, lumbar region: Secondary | ICD-10-CM | POA: Diagnosis not present

## 2021-05-25 DIAGNOSIS — M47816 Spondylosis without myelopathy or radiculopathy, lumbar region: Secondary | ICD-10-CM | POA: Diagnosis not present

## 2021-05-25 DIAGNOSIS — G894 Chronic pain syndrome: Secondary | ICD-10-CM | POA: Diagnosis not present

## 2021-05-25 DIAGNOSIS — M5416 Radiculopathy, lumbar region: Secondary | ICD-10-CM | POA: Diagnosis not present

## 2021-05-25 DIAGNOSIS — M47812 Spondylosis without myelopathy or radiculopathy, cervical region: Secondary | ICD-10-CM | POA: Diagnosis not present

## 2021-06-07 DIAGNOSIS — M722 Plantar fascial fibromatosis: Secondary | ICD-10-CM | POA: Diagnosis not present

## 2021-06-07 DIAGNOSIS — M79671 Pain in right foot: Secondary | ICD-10-CM | POA: Diagnosis not present

## 2021-06-07 DIAGNOSIS — M7731 Calcaneal spur, right foot: Secondary | ICD-10-CM | POA: Diagnosis not present

## 2021-06-07 DIAGNOSIS — M7732 Calcaneal spur, left foot: Secondary | ICD-10-CM | POA: Diagnosis not present

## 2021-06-29 ENCOUNTER — Other Ambulatory Visit: Payer: Self-pay

## 2021-06-29 ENCOUNTER — Inpatient Hospital Stay (HOSPITAL_COMMUNITY): Payer: Medicare Other | Attending: Hematology

## 2021-06-29 DIAGNOSIS — Z79811 Long term (current) use of aromatase inhibitors: Secondary | ICD-10-CM | POA: Insufficient documentation

## 2021-06-29 DIAGNOSIS — R109 Unspecified abdominal pain: Secondary | ICD-10-CM | POA: Diagnosis not present

## 2021-06-29 DIAGNOSIS — Z79899 Other long term (current) drug therapy: Secondary | ICD-10-CM | POA: Insufficient documentation

## 2021-06-29 DIAGNOSIS — K5900 Constipation, unspecified: Secondary | ICD-10-CM | POA: Insufficient documentation

## 2021-06-29 DIAGNOSIS — R110 Nausea: Secondary | ICD-10-CM | POA: Diagnosis not present

## 2021-06-29 DIAGNOSIS — Z170 Estrogen receptor positive status [ER+]: Secondary | ICD-10-CM | POA: Insufficient documentation

## 2021-06-29 DIAGNOSIS — C50412 Malignant neoplasm of upper-outer quadrant of left female breast: Secondary | ICD-10-CM | POA: Insufficient documentation

## 2021-06-29 DIAGNOSIS — R232 Flushing: Secondary | ICD-10-CM | POA: Diagnosis not present

## 2021-06-29 DIAGNOSIS — R519 Headache, unspecified: Secondary | ICD-10-CM | POA: Diagnosis not present

## 2021-06-29 DIAGNOSIS — G479 Sleep disorder, unspecified: Secondary | ICD-10-CM | POA: Diagnosis not present

## 2021-06-29 DIAGNOSIS — Z888 Allergy status to other drugs, medicaments and biological substances status: Secondary | ICD-10-CM | POA: Diagnosis not present

## 2021-06-29 DIAGNOSIS — Z881 Allergy status to other antibiotic agents status: Secondary | ICD-10-CM | POA: Insufficient documentation

## 2021-06-29 DIAGNOSIS — Z880 Allergy status to penicillin: Secondary | ICD-10-CM | POA: Diagnosis not present

## 2021-06-29 LAB — CBC WITH DIFFERENTIAL/PLATELET
Abs Immature Granulocytes: 0.02 10*3/uL (ref 0.00–0.07)
Basophils Absolute: 0.1 10*3/uL (ref 0.0–0.1)
Basophils Relative: 1 %
Eosinophils Absolute: 0.2 10*3/uL (ref 0.0–0.5)
Eosinophils Relative: 2 %
HCT: 44.6 % (ref 36.0–46.0)
Hemoglobin: 14.5 g/dL (ref 12.0–15.0)
Immature Granulocytes: 0 %
Lymphocytes Relative: 23 %
Lymphs Abs: 1.8 10*3/uL (ref 0.7–4.0)
MCH: 32.5 pg (ref 26.0–34.0)
MCHC: 32.5 g/dL (ref 30.0–36.0)
MCV: 100 fL (ref 80.0–100.0)
Monocytes Absolute: 0.5 10*3/uL (ref 0.1–1.0)
Monocytes Relative: 7 %
Neutro Abs: 5.2 10*3/uL (ref 1.7–7.7)
Neutrophils Relative %: 67 %
Platelets: 241 10*3/uL (ref 150–400)
RBC: 4.46 MIL/uL (ref 3.87–5.11)
RDW: 13 % (ref 11.5–15.5)
WBC: 7.8 10*3/uL (ref 4.0–10.5)
nRBC: 0 % (ref 0.0–0.2)

## 2021-06-29 LAB — VITAMIN D 25 HYDROXY (VIT D DEFICIENCY, FRACTURES): Vit D, 25-Hydroxy: 16.39 ng/mL — ABNORMAL LOW (ref 30–100)

## 2021-06-29 LAB — COMPREHENSIVE METABOLIC PANEL
ALT: 26 U/L (ref 0–44)
AST: 29 U/L (ref 15–41)
Albumin: 4.1 g/dL (ref 3.5–5.0)
Alkaline Phosphatase: 117 U/L (ref 38–126)
Anion gap: 5 (ref 5–15)
BUN: 16 mg/dL (ref 8–23)
CO2: 28 mmol/L (ref 22–32)
Calcium: 10.1 mg/dL (ref 8.9–10.3)
Chloride: 106 mmol/L (ref 98–111)
Creatinine, Ser: 0.73 mg/dL (ref 0.44–1.00)
GFR, Estimated: >60 mL/min (ref >60)
Glucose, Bld: 96 mg/dL (ref 70–99)
Potassium: 4.1 mmol/L (ref 3.5–5.1)
Sodium: 139 mmol/L (ref 135–145)
Total Bilirubin: 0.6 mg/dL (ref 0.3–1.2)
Total Protein: 7.5 g/dL (ref 6.5–8.1)

## 2021-07-05 DIAGNOSIS — M722 Plantar fascial fibromatosis: Secondary | ICD-10-CM | POA: Diagnosis not present

## 2021-07-05 DIAGNOSIS — M7732 Calcaneal spur, left foot: Secondary | ICD-10-CM | POA: Diagnosis not present

## 2021-07-05 DIAGNOSIS — M7731 Calcaneal spur, right foot: Secondary | ICD-10-CM | POA: Diagnosis not present

## 2021-07-05 DIAGNOSIS — M79671 Pain in right foot: Secondary | ICD-10-CM | POA: Diagnosis not present

## 2021-07-05 NOTE — Progress Notes (Signed)
Tracy Valentine, Tracy Valentine [id number]   Patient Care Team: Sharilyn Sites, MD as PCP - General (Family Medicine)  SUMMARY OF ONCOLOGIC HISTORY: Oncology History  Breast cancer of upper-outer quadrant of left female breast ([facility])  07/26/2016 Mammogram   Area of developing asymmetry with distortion located within the upper-outer quadrant left breast. Tissue sampling via tomosynthesis guided biopsy is recommended and will be scheduled.   RECOMMENDATION: Left breast tomosynthesis guided biopsy. This has been scheduled for 07/28/2016.    07/28/2016 Initial Biopsy   Coil shaped clip corresponds to the asymmetry in the outer left breast. Clip is well positioned at the biopsy site. 2. X shaped clip corresponds to the ASYMMETRY/DISTORTION in the upper-outer quadrant of the left breast. Clip is positioned approximately 1 cm medial to the center of the biopsy cavity   07/28/2016 Pathology Results   Estrogen Receptor: 80%, POSITIVE, MODERATE STAINING INTENSITY Progesterone Receptor: 0%, NEGATIVE Proliferation Marker Ki67: 70% HER 2 negative by FISH Breast, left, needle core biopsy, upper outer quadrant - INVASIVE DUCTAL CARCINOMA, SEE COMMENT. - HIGH GRADE DUCTAL CARCINOMA IN SITU WITH NECROSIS.   08/24/2016 Oncotype testing   Recurrence Score Result 30, 10 year risk of distant recurrence Tamoxifen alone 20%   10/03/2016 - 12/05/2016 Chemotherapy   The patient had palonosetron (ALOXI) injection 0.25 mg, 0.25 mg, Intravenous,  Once, 2 of 4 cycles Administration: 0.25 mg (10/24/2016)  pegfilgrastim (NEULASTA ONPRO KIT) injection 6 mg, 6 mg, Subcutaneous, Once, 2 of 4 cycles Administration: 6 mg (10/24/2016)  cyclophosphamide (CYTOXAN) 1,380 mg in sodium chloride 0.9 % 250 mL chemo infusion, 600 mg/m2 = 1,380 mg, Intravenous,  Once, 2 of 4 cycles Administration: 1,380 mg (10/24/2016)  DOCEtaxel (TAXOTERE) 170 mg in dextrose 5 % 250 mL chemo infusion, 75 mg/m2 = 170  mg, Intravenous,  Once, 2 of 4 cycles Dose modification: 65 mg/m2 (original dose 75 mg/m2, Cycle 2, Reason: Dose not tolerated)  fosaprepitant (EMEND) 150 mg, dexamethasone (DECADRON) 12 mg in sodium chloride 0.9 % 145 mL IVPB, , Intravenous,  Once, 1 of 3 cycles Administration:  (10/24/2016)  for chemotherapy treatment.      - 02/06/2017 Radiation Therapy   Completed adjuvant breast RT    02/26/2017 -  Anti-estrogen oral therapy   Started anastrozole   03/05/2017 Pathology Results   BCI-high risk of late recurrence (years 5-10) at 10.6% with a high likelihood of benefit from extended endocrine therapy.  High overall risk of recurrence (years 0-10) 19.5% and a high likelihood of benefit from extended endocrine therapy.   11/14/2017 Genetic Testing   Negative genetic testing on the Multi-cancer panel.  The Multi-Gene Panel offered by [facility] includes sequencing and/or deletion duplication testing of the following 83 genes: ALK, APC, ATM, AXIN2,BAP1,  BARD1, BLM, BMPR1A, BRCA1, BRCA2, BRIP1, CASR, CDC73, CDH1, CDK4, CDKN1B, CDKN1C, CDKN2A (p14ARF), CDKN2A (p16INK4a), CEBPA, CHEK2, CTNNA1, DICER1, DIS3L2, EGFR (c.2369C>T, p.Thr790Met variant only), EPCAM (Deletion/duplication testing only), FH, FLCN, GATA2, GPC3, GREM1 (Promoter region deletion/duplication testing only), HOXB13 (c.251G>A, p.Gly84Glu), HRAS, KIT, MAX, MEN1, MET, MITF (c.952G>A, p.Glu318Lys variant only), MLH1, MSH2, MSH3, MSH6, MUTYH, NBN, NF1, NF2, NTHL1, PALB2, PDGFRA, PHOX2B, PMS2, POLD1, POLE, POT1, PRKAR1A, PTCH1, PTEN, RAD50, RAD51C, RAD51D, RB1, RECQL4, RET, RUNX1, SDHAF2, SDHA (sequence changes only), SDHB, SDHC, SDHD, SMAD4, SMARCA4, SMARCB1, SMARCE1, STK11, SUFU, TERT, TERT, TMEM127, TP53, TSC1, TSC2, VHL, WRN and WT1. The report date is November 14, 2017.      CHIEF COMPLIANT: Follow-up for left breast cancer  INTERVAL HISTORY: Tracy Valentine is a 65 y.o. female here today for follow up of her left breast cancer.  Her last visit was on 12/03/2018.   Today she reports feeling good. She reports occasional nausea, right side pain, and hot flashes. She denies hematuria. She has not been taking Vitamin D. Her nephew was recently diagnosed with stage IV stomach cancer at age of 38.  REVIEW OF SYSTEMS:   Review of Systems  Constitutional:  Negative for appetite change and fatigue.  Gastrointestinal:  Positive for abdominal pain (R side), constipation and nausea.  Endocrine: Positive for hot flashes.  Genitourinary:  Negative for hematuria.   Neurological:  Positive for headaches.  Psychiatric/Behavioral:  Positive for sleep disturbance.   All other systems reviewed and are negative.  I have reviewed the past medical history, past surgical history, social history and family history with the patient and they are unchanged from previous note.   ALLERGIES:   is allergic to adhesive [tape], amoxicillin, darvocet [propoxyphene n-acetaminophen], doxycycline, erythromycin, shellfish allergy, zithromax [azithromycin], and latex.   MEDICATIONS:  Current Outpatient Medications  Medication Sig Dispense Refill   AMBIEN 10 MG tablet Take 10 mg by mouth at bedtime.     aspirin EC 81 MG tablet Take 81 mg by mouth at bedtime.     calcium carbonate (TUMS - DOSED IN MG ELEMENTAL CALCIUM) 500 MG chewable tablet Chew 2 tablets by mouth daily.      Cholecalciferol (VITAMIN D3) 2000 units TABS Take 2,000 Units by mouth daily.     cyclobenzaprine (FLEXERIL) 5 MG tablet TAKE 1 TO 2 TABLETS BY MOUTH THREE TIMES DAILY AS NEEDED     diclofenac Sodium (VOLTAREN) 1 % GEL Apply 4 g topically 4 (four) times daily.     esomeprazole (NEXIUM) 40 MG capsule Take 40 mg by mouth daily.     fluticasone (FLONASE) 50 MCG/ACT nasal spray Place 1 spray into both nostrils daily. May repeat dose in evening if needed     letrozole (FEMARA) 2.5 MG tablet Take 1 tablet (2.5 mg total) by mouth daily. 90 tablet 3   levothyroxine (SYNTHROID,  LEVOTHROID) 100 MCG tablet Take 100 mcg by mouth daily before breakfast.   5   NURTEC 75 MG TBDP Take 1 tablet by mouth daily.     oxyCODONE-acetaminophen (PERCOCET) 10-325 MG tablet Take 1 tablet by mouth every 6 (six) hours as needed.     polyethylene glycol powder (GLYCOLAX/MIRALAX) powder Take 17 g by mouth 2 (two) times daily.     simvastatin (ZOCOR) 20 MG tablet Take 20 mg by mouth at bedtime.      ondansetron (ZOFRAN) 4 MG tablet TAKE 1 TABLET BY MOUTH TWICE DAILY AS NEEDED FOR NAUSEA AND VOMITING 90 tablet 6   No current facility-administered medications for this visit.   Facility-Administered Medications Ordered in Other Visits  Medication Dose Route Frequency Provider Last Rate Last Admin   sodium chloride flush (NS) 0.9 % injection 10 mL  10 mL Intravenous PRN Holley Bouche, NP   10 mL at 10/04/17 1111     PHYSICAL EXAMINATION: Performance status (ECOG): 1 - Symptomatic but completely ambulatory  There were no vitals filed for this visit. Wt Readings from Last 3 Encounters:  12/03/18 245 lb 9.6 oz (111.4 kg)  09/23/18 244 lb (110.7 kg)  08/02/18 239 lb (108.4 kg)   Physical Exam Vitals reviewed.  Constitutional:      Appearance: Normal appearance.  Cardiovascular:  Rate and Rhythm: Normal rate and regular rhythm.     Pulses: Normal pulses.     Heart sounds: Normal heart sounds.  Pulmonary:     Effort: Pulmonary effort is normal.     Breath sounds: Normal breath sounds.  Chest:  Breasts:    Right: Normal.     Left: Skin change (lumpectomy scar above superior margin of areola within normal limits) present.  Musculoskeletal:     Right lower leg: No edema.     Left lower leg: No edema.  Lymphadenopathy:     Upper Body:     Right upper body: No supraclavicular, axillary or pectoral adenopathy.     Left upper body: No supraclavicular, axillary or pectoral adenopathy.  Neurological:     General: No focal deficit present.     Mental Status: She is alert and  oriented to person, place, and time.  Psychiatric:        Mood and Affect: Mood normal.        Behavior: Behavior normal.    Breast Exam Chaperone: Thana Ates     LABORATORY DATA:  I have reviewed the data as listed CMP Latest Ref Rng & Units 06/29/2021 12/09/2020 04/08/2020  Glucose 70 - 99 mg/dL 96 160(H) 103(H)  BUN 8 - 23 mg/dL '16 18 18  ' Creatinine 0.44 - 1.00 mg/dL 0.73 0.82 0.90  Sodium 135 - 145 mmol/L 139 136 138  Potassium 3.5 - 5.1 mmol/L 4.1 3.8 4.1  Chloride 98 - 111 mmol/L 106 103 102  CO2 22 - 32 mmol/L '28 27 28  ' Calcium 8.9 - 10.3 mg/dL 10.1 10.1 10.1  Total Protein 6.5 - 8.1 g/dL 7.5 6.6 7.2  Total Bilirubin 0.3 - 1.2 mg/dL 0.6 0.8 0.8  Alkaline Phos 38 - 126 U/L 117 126 101  AST 15 - 41 U/L 29 48(H) 28  ALT 0 - 44 U/L 26 67(H) 32   No results found for: OVZ858 Lab Results  Component Value Date   WBC 7.8 06/29/2021   HGB 14.5 06/29/2021   HCT 44.6 06/29/2021   MCV 100.0 06/29/2021   PLT 241 06/29/2021   NEUTROABS 5.2 06/29/2021    ASSESSMENT:  1.  T1c N0 left breast cancer, ER positive, PR/HER-2 negative: - Status post lumpectomy on 08/24/2016, pathology showing 1.9 cm, grade 3, 0 out of 4 lymph nodes positive, margins negative, Ki-67 of 70% -Oncotype DX recurrence score of 30, BCI-high risk, [facility] test on 11/14/2017 negative for hereditary breast cancer. -4 cycles of adjuvant TC from 09/25/2016 through 12/05/2016 - Anastrozole started in February 2018, switched to letrozole in February 2019 secondary to hair thinning. - Mammogram on 10/13/2020 showed interval 6 mm group of indeterminate calcifications at the lumpectomy site in the upper outer left breast. -Left breast upper outer quadrant biopsy on 10/20/2020 with fat necrosis and fibrosis and rare calcifications consistent with scar.  Negative for carcinoma.  2.  Bone density: -DEXA scan on 02/14/2017 shows T score of -0.2.    PLAN:  1.  T1c N0 left breast cancer, ER positive, PR/HER-2 negative: -  Left breast lumpectomy scar around the areola and in the upper outer quadrant is within normal limits. - We will schedule her for mammogram in December. - We reviewed labs from 06/29/2021 which showed normal LFTs and CBC. - RTC 6 months with repeat physical exam and labs.  2.  Bone health: - Vitamin D level is low at 16.3.  Recommend restarting vitamin D daily. - Recommend restarting  calcium.  3.  Intermittent nausea: - She reports occasional nausea. - We will send her prescription for Zofran 8 mg as needed.  4.  Sleeping difficulty: - I have refilled Ambien 10 mg at bedtime with 5 refills.   Breast Cancer therapy associated bone loss: I have recommended calcium, Vitamin D and weight bearing exercises.  Orders placed this encounter:  No orders of the defined types were placed in this encounter.   The patient has a good understanding of the overall plan. She agrees with it. She will call with any problems that may develop before the next visit here.  Derek Jack, MD [place] [phone]   I, Thana Ates, am acting as a scribe for Dr. Derek Jack.  I, Derek Jack MD, have reviewed the above documentation for accuracy and completeness, and I agree with the above.

## 2021-07-06 ENCOUNTER — Other Ambulatory Visit (HOSPITAL_COMMUNITY): Payer: Self-pay | Admitting: Hematology

## 2021-07-06 ENCOUNTER — Inpatient Hospital Stay (HOSPITAL_COMMUNITY): Payer: Medicare Other | Admitting: Hematology

## 2021-07-06 ENCOUNTER — Other Ambulatory Visit: Payer: Self-pay

## 2021-07-06 DIAGNOSIS — G479 Sleep disorder, unspecified: Secondary | ICD-10-CM | POA: Diagnosis not present

## 2021-07-06 DIAGNOSIS — R232 Flushing: Secondary | ICD-10-CM | POA: Diagnosis not present

## 2021-07-06 DIAGNOSIS — K5900 Constipation, unspecified: Secondary | ICD-10-CM | POA: Diagnosis not present

## 2021-07-06 DIAGNOSIS — C50412 Malignant neoplasm of upper-outer quadrant of left female breast: Secondary | ICD-10-CM

## 2021-07-06 DIAGNOSIS — R109 Unspecified abdominal pain: Secondary | ICD-10-CM | POA: Diagnosis not present

## 2021-07-06 DIAGNOSIS — Z79899 Other long term (current) drug therapy: Secondary | ICD-10-CM | POA: Diagnosis not present

## 2021-07-06 DIAGNOSIS — Z881 Allergy status to other antibiotic agents status: Secondary | ICD-10-CM | POA: Diagnosis not present

## 2021-07-06 DIAGNOSIS — Z170 Estrogen receptor positive status [ER+]: Secondary | ICD-10-CM

## 2021-07-06 DIAGNOSIS — R110 Nausea: Secondary | ICD-10-CM

## 2021-07-06 DIAGNOSIS — Z888 Allergy status to other drugs, medicaments and biological substances status: Secondary | ICD-10-CM | POA: Diagnosis not present

## 2021-07-06 DIAGNOSIS — Z880 Allergy status to penicillin: Secondary | ICD-10-CM | POA: Diagnosis not present

## 2021-07-06 DIAGNOSIS — R519 Headache, unspecified: Secondary | ICD-10-CM | POA: Diagnosis not present

## 2021-07-06 DIAGNOSIS — Z79811 Long term (current) use of aromatase inhibitors: Secondary | ICD-10-CM | POA: Diagnosis not present

## 2021-07-06 MED ORDER — AMBIEN 10 MG PO TABS: 10.0000 mg | ORAL_TABLET | Freq: Every day | ORAL | 5 refills | Status: AC

## 2021-07-06 MED ORDER — ONDANSETRON HCL 4 MG PO TABS: ORAL_TABLET | ORAL | 6 refills | Status: DC

## 2021-07-06 NOTE — Patient Instructions (Addendum)
[place] at [facility] Discharge Instructions  You were seen today by Dr. Delton Coombes. He went over your recent results. You will be scheduled for a mammogram after October 03, 2021. Dr. Delton Coombes will see you back in 6 months for labs and follow up.   Thank you for choosing [place] at [facility] to provide your oncology and hematology care.  To afford each patient quality time with our provider, please arrive at least 15 minutes before your scheduled appointment time.   If you have a lab appointment with the [facility] please come in thru the Main Entrance and check in at the main information desk  You need to re-schedule your appointment should you arrive 10 or more minutes late.  We strive to give you quality time with our providers, and arriving late affects you and other patients whose appointments are after yours.  Also, if you no show three or more times for appointments you may be dismissed from the clinic at the providers discretion.     Again, thank you for choosing [facility].  Our hope is that these requests will decrease the amount of time that you wait before being seen by our physicians.       _____________________________________________________________  Should you have questions after your visit to [facility], please contact our office at [phone] between the hours of 8:00 a.m. and 4:30 p.m.  Voicemails left after 4:00 p.m. will not be returned until the following business day.  For prescription refill requests, have your pharmacy contact our office and allow 72 hours.    Cancer Center Support Programs:   > Cancer Support Group  2nd Tuesday of the month 1pm-2pm, Journey Room

## 2021-07-21 DIAGNOSIS — M47812 Spondylosis without myelopathy or radiculopathy, cervical region: Secondary | ICD-10-CM | POA: Diagnosis not present

## 2021-07-21 DIAGNOSIS — M47816 Spondylosis without myelopathy or radiculopathy, lumbar region: Secondary | ICD-10-CM | POA: Diagnosis not present

## 2021-07-21 DIAGNOSIS — M5416 Radiculopathy, lumbar region: Secondary | ICD-10-CM | POA: Diagnosis not present

## 2021-07-21 DIAGNOSIS — G894 Chronic pain syndrome: Secondary | ICD-10-CM | POA: Diagnosis not present

## 2021-08-05 DIAGNOSIS — D0592 Unspecified type of carcinoma in situ of left breast: Secondary | ICD-10-CM | POA: Diagnosis not present

## 2021-08-05 DIAGNOSIS — M159 Polyosteoarthritis, unspecified: Secondary | ICD-10-CM | POA: Diagnosis not present

## 2021-08-05 DIAGNOSIS — E669 Obesity, unspecified: Secondary | ICD-10-CM | POA: Diagnosis not present

## 2021-08-05 DIAGNOSIS — E039 Hypothyroidism, unspecified: Secondary | ICD-10-CM | POA: Diagnosis not present

## 2021-08-05 DIAGNOSIS — E7849 Other hyperlipidemia: Secondary | ICD-10-CM | POA: Diagnosis not present

## 2021-08-05 DIAGNOSIS — E063 Autoimmune thyroiditis: Secondary | ICD-10-CM | POA: Diagnosis not present

## 2021-08-11 DIAGNOSIS — G4733 Obstructive sleep apnea (adult) (pediatric): Secondary | ICD-10-CM | POA: Diagnosis not present

## 2021-09-08 ENCOUNTER — Other Ambulatory Visit (HOSPITAL_COMMUNITY): Payer: Self-pay | Admitting: Hematology

## 2021-09-08 DIAGNOSIS — Z170 Estrogen receptor positive status [ER+]: Secondary | ICD-10-CM

## 2021-09-22 DIAGNOSIS — M47812 Spondylosis without myelopathy or radiculopathy, cervical region: Secondary | ICD-10-CM | POA: Diagnosis not present

## 2021-09-22 DIAGNOSIS — G894 Chronic pain syndrome: Secondary | ICD-10-CM | POA: Diagnosis not present

## 2021-09-22 DIAGNOSIS — M47816 Spondylosis without myelopathy or radiculopathy, lumbar region: Secondary | ICD-10-CM | POA: Diagnosis not present

## 2021-09-22 DIAGNOSIS — M5416 Radiculopathy, lumbar region: Secondary | ICD-10-CM | POA: Diagnosis not present

## 2021-09-30 ENCOUNTER — Other Ambulatory Visit: Payer: Self-pay | Admitting: Hematology

## 2021-09-30 DIAGNOSIS — Z853 Personal history of malignant neoplasm of breast: Secondary | ICD-10-CM

## 2021-10-18 ENCOUNTER — Other Ambulatory Visit (HOSPITAL_COMMUNITY): Payer: Medicare Other

## 2021-10-18 ENCOUNTER — Encounter (HOSPITAL_COMMUNITY): Payer: Medicare Other

## 2021-11-02 DIAGNOSIS — G4700 Insomnia, unspecified: Secondary | ICD-10-CM | POA: Diagnosis not present

## 2021-11-02 DIAGNOSIS — N390 Urinary tract infection, site not specified: Secondary | ICD-10-CM | POA: Diagnosis not present

## 2021-11-02 DIAGNOSIS — F419 Anxiety disorder, unspecified: Secondary | ICD-10-CM | POA: Diagnosis not present

## 2022-01-03 ENCOUNTER — Other Ambulatory Visit (HOSPITAL_COMMUNITY): Payer: Self-pay

## 2022-01-03 DIAGNOSIS — C50412 Malignant neoplasm of upper-outer quadrant of left female breast: Secondary | ICD-10-CM

## 2022-01-04 ENCOUNTER — Inpatient Hospital Stay (HOSPITAL_COMMUNITY): Payer: Medicare Other | Attending: Hematology

## 2022-01-04 DIAGNOSIS — R110 Nausea: Secondary | ICD-10-CM | POA: Diagnosis not present

## 2022-01-04 DIAGNOSIS — R140 Abdominal distension (gaseous): Secondary | ICD-10-CM | POA: Diagnosis not present

## 2022-01-04 DIAGNOSIS — G479 Sleep disorder, unspecified: Secondary | ICD-10-CM | POA: Diagnosis not present

## 2022-01-04 DIAGNOSIS — Z79811 Long term (current) use of aromatase inhibitors: Secondary | ICD-10-CM | POA: Diagnosis not present

## 2022-01-04 DIAGNOSIS — C50412 Malignant neoplasm of upper-outer quadrant of left female breast: Secondary | ICD-10-CM | POA: Diagnosis not present

## 2022-01-04 DIAGNOSIS — R519 Headache, unspecified: Secondary | ICD-10-CM | POA: Diagnosis not present

## 2022-01-04 DIAGNOSIS — K5900 Constipation, unspecified: Secondary | ICD-10-CM | POA: Insufficient documentation

## 2022-01-04 DIAGNOSIS — Z170 Estrogen receptor positive status [ER+]: Secondary | ICD-10-CM | POA: Diagnosis not present

## 2022-01-04 DIAGNOSIS — M2550 Pain in unspecified joint: Secondary | ICD-10-CM | POA: Insufficient documentation

## 2022-01-04 DIAGNOSIS — Z881 Allergy status to other antibiotic agents status: Secondary | ICD-10-CM | POA: Diagnosis not present

## 2022-01-04 DIAGNOSIS — Z880 Allergy status to penicillin: Secondary | ICD-10-CM | POA: Diagnosis not present

## 2022-01-04 DIAGNOSIS — Z888 Allergy status to other drugs, medicaments and biological substances status: Secondary | ICD-10-CM | POA: Diagnosis not present

## 2022-01-04 DIAGNOSIS — Z79899 Other long term (current) drug therapy: Secondary | ICD-10-CM | POA: Insufficient documentation

## 2022-01-04 DIAGNOSIS — M549 Dorsalgia, unspecified: Secondary | ICD-10-CM | POA: Insufficient documentation

## 2022-01-04 DIAGNOSIS — R109 Unspecified abdominal pain: Secondary | ICD-10-CM | POA: Insufficient documentation

## 2022-01-04 DIAGNOSIS — N6489 Other specified disorders of breast: Secondary | ICD-10-CM | POA: Diagnosis not present

## 2022-01-04 LAB — COMPREHENSIVE METABOLIC PANEL
ALT: 44 U/L (ref 0–44)
AST: 40 U/L (ref 15–41)
Albumin: 4 g/dL (ref 3.5–5.0)
Alkaline Phosphatase: 162 U/L — ABNORMAL HIGH (ref 38–126)
Anion gap: 7 (ref 5–15)
BUN: 19 mg/dL (ref 8–23)
CO2: 23 mmol/L (ref 22–32)
Calcium: 9.7 mg/dL (ref 8.9–10.3)
Chloride: 105 mmol/L (ref 98–111)
Creatinine, Ser: 0.83 mg/dL (ref 0.44–1.00)
GFR, Estimated: >60 mL/min (ref >60)
Glucose, Bld: 308 mg/dL — ABNORMAL HIGH (ref 70–99)
Potassium: 4 mmol/L (ref 3.5–5.1)
Sodium: 135 mmol/L (ref 135–145)
Total Bilirubin: 1 mg/dL (ref 0.3–1.2)
Total Protein: 7.5 g/dL (ref 6.5–8.1)

## 2022-01-04 LAB — CBC WITH DIFFERENTIAL/PLATELET
Abs Immature Granulocytes: 0.02 10*3/uL (ref 0.00–0.07)
Basophils Absolute: 0.1 10*3/uL (ref 0.0–0.1)
Basophils Relative: 1 %
Eosinophils Absolute: 0.3 10*3/uL (ref 0.0–0.5)
Eosinophils Relative: 4 %
HCT: 45.8 % (ref 36.0–46.0)
Hemoglobin: 15.1 g/dL — ABNORMAL HIGH (ref 12.0–15.0)
Immature Granulocytes: 0 %
Lymphocytes Relative: 31 %
Lymphs Abs: 2.3 10*3/uL (ref 0.7–4.0)
MCH: 32.5 pg (ref 26.0–34.0)
MCHC: 33 g/dL (ref 30.0–36.0)
MCV: 98.7 fL (ref 80.0–100.0)
Monocytes Absolute: 0.4 10*3/uL (ref 0.1–1.0)
Monocytes Relative: 6 %
Neutro Abs: 4.4 10*3/uL (ref 1.7–7.7)
Neutrophils Relative %: 58 %
Platelets: 211 10*3/uL (ref 150–400)
RBC: 4.64 MIL/uL (ref 3.87–5.11)
RDW: 12.7 % (ref 11.5–15.5)
WBC: 7.4 10*3/uL (ref 4.0–10.5)
nRBC: 0 % (ref 0.0–0.2)

## 2022-01-04 LAB — VITAMIN D 25 HYDROXY (VIT D DEFICIENCY, FRACTURES): Vit D, 25-Hydroxy: 24.1 ng/mL — ABNORMAL LOW (ref 30–100)

## 2022-01-11 ENCOUNTER — Inpatient Hospital Stay (HOSPITAL_COMMUNITY): Payer: Medicare Other | Admitting: Hematology

## 2022-01-11 ENCOUNTER — Other Ambulatory Visit: Payer: Self-pay

## 2022-01-11 VITALS — BP 122/62 | HR 63 | Temp 98.1°F | Resp 18 | Wt 275.0 lb

## 2022-01-11 DIAGNOSIS — R110 Nausea: Secondary | ICD-10-CM | POA: Diagnosis not present

## 2022-01-11 DIAGNOSIS — N6489 Other specified disorders of breast: Secondary | ICD-10-CM | POA: Diagnosis not present

## 2022-01-11 DIAGNOSIS — R140 Abdominal distension (gaseous): Secondary | ICD-10-CM | POA: Diagnosis not present

## 2022-01-11 DIAGNOSIS — Z880 Allergy status to penicillin: Secondary | ICD-10-CM | POA: Diagnosis not present

## 2022-01-11 DIAGNOSIS — Z170 Estrogen receptor positive status [ER+]: Secondary | ICD-10-CM

## 2022-01-11 DIAGNOSIS — C50412 Malignant neoplasm of upper-outer quadrant of left female breast: Secondary | ICD-10-CM | POA: Diagnosis not present

## 2022-01-11 DIAGNOSIS — Z79811 Long term (current) use of aromatase inhibitors: Secondary | ICD-10-CM | POA: Diagnosis not present

## 2022-01-11 DIAGNOSIS — G479 Sleep disorder, unspecified: Secondary | ICD-10-CM | POA: Diagnosis not present

## 2022-01-11 DIAGNOSIS — Z888 Allergy status to other drugs, medicaments and biological substances status: Secondary | ICD-10-CM | POA: Diagnosis not present

## 2022-01-11 DIAGNOSIS — M549 Dorsalgia, unspecified: Secondary | ICD-10-CM | POA: Diagnosis not present

## 2022-01-11 DIAGNOSIS — Z79899 Other long term (current) drug therapy: Secondary | ICD-10-CM | POA: Diagnosis not present

## 2022-01-11 DIAGNOSIS — M2550 Pain in unspecified joint: Secondary | ICD-10-CM | POA: Diagnosis not present

## 2022-01-11 DIAGNOSIS — R109 Unspecified abdominal pain: Secondary | ICD-10-CM | POA: Diagnosis not present

## 2022-01-11 DIAGNOSIS — Z881 Allergy status to other antibiotic agents status: Secondary | ICD-10-CM | POA: Diagnosis not present

## 2022-01-11 DIAGNOSIS — R519 Headache, unspecified: Secondary | ICD-10-CM | POA: Diagnosis not present

## 2022-01-11 DIAGNOSIS — K5900 Constipation, unspecified: Secondary | ICD-10-CM | POA: Diagnosis not present

## 2022-01-11 NOTE — Progress Notes (Signed)
? ?[facility] ?[address] ?[place], [place] [address] ? ? ?Patient Care Team: ?Sharilyn Sites, MD as PCP - General (Family Medicine) ? ?SUMMARY OF ONCOLOGIC HISTORY: ?Oncology History  ?Breast cancer of upper-outer quadrant of left female breast [facility])  ?07/26/2016 Mammogram  ? Area of developing asymmetry with distortion located within the ?upper-outer quadrant left breast. Tissue sampling via tomosynthesis ?guided biopsy is recommended and will be scheduled. ?  ?RECOMMENDATION: ?Left breast tomosynthesis guided biopsy. This has been scheduled for ?07/28/2016. ? ?  ?07/28/2016 Initial Biopsy  ? Coil shaped clip corresponds to the asymmetry in the outer left ?breast. Clip is well positioned at the biopsy site. ?2. X shaped clip corresponds to the ASYMMETRY/DISTORTION in the ?upper-outer quadrant of the left breast. Clip is positioned ?approximately 1 cm medial to the center of the biopsy cavity ?  ?07/28/2016 Pathology Results  ? Estrogen Receptor: 80%, POSITIVE, MODERATE STAINING INTENSITY ?Progesterone Receptor: 0%, NEGATIVE ?Proliferation Marker Ki67: 70% ?HER 2 negative by FISH ?Breast, left, needle core biopsy, upper outer quadrant ?- INVASIVE DUCTAL CARCINOMA, SEE COMMENT. ?- HIGH GRADE DUCTAL CARCINOMA IN SITU WITH NECROSIS. ?  ?08/24/2016 Oncotype testing  ? Recurrence Score Result 30, 10 year risk of distant recurrence Tamoxifen alone 20% ?  ?10/03/2016 - 12/05/2016 Chemotherapy  ? The patient had palonosetron (ALOXI) injection 0.25 mg, 0.25 mg, Intravenous,  Once, 2 of 4 cycles ?Administration: 0.25 mg (10/24/2016) ? ?pegfilgrastim (NEULASTA ONPRO KIT) injection 6 mg, 6 mg, Subcutaneous, Once, 2 of 4 cycles ?Administration: 6 mg (10/24/2016) ? ?cyclophosphamide (CYTOXAN) 1,380 mg in sodium chloride 0.9 % 250 mL chemo infusion, 600 mg/m2 = 1,380 mg, Intravenous,  Once, 2 of 4 cycles ?Administration: 1,380 mg (10/24/2016) ? ?DOCEtaxel (TAXOTERE) 170 mg in dextrose 5 % 250 mL chemo infusion, 75 mg/m2 = 170  mg, Intravenous,  Once, 2 of 4 cycles ?Dose modification: 65 mg/m2 (original dose 75 mg/m2, Cycle 2, Reason: Dose not tolerated) ? ?fosaprepitant (EMEND) 150 mg, dexamethasone (DECADRON) 12 mg in sodium chloride 0.9 % 145 mL IVPB, , Intravenous,  Once, 1 of 3 cycles ?Administration:  (10/24/2016) ? for chemotherapy treatment.  ? ?  ? - 02/06/2017 Radiation Therapy  ? Completed adjuvant breast RT ? ?  ?02/26/2017 -  Anti-estrogen oral therapy  ? Started anastrozole ?  ?03/05/2017 Pathology Results  ? BCI-high risk of late recurrence (years 5-10) at 10.6% with a high likelihood of benefit from extended endocrine therapy.  High overall risk of recurrence (years 0-10) 19.5% and a high likelihood of benefit from extended endocrine therapy. ?  ?11/14/2017 Genetic Testing  ? Negative genetic testing on the Multi-cancer panel.  The Multi-Gene Panel offered by [facility] includes sequencing and/or deletion duplication testing of the following 83 genes: ALK, APC, ATM, AXIN2,BAP1,  BARD1, BLM, BMPR1A, BRCA1, BRCA2, BRIP1, CASR, CDC73, CDH1, CDK4, CDKN1B, CDKN1C, CDKN2A (p14ARF), CDKN2A (p16INK4a), CEBPA, CHEK2, CTNNA1, DICER1, DIS3L2, EGFR (c.2369C>T, p.Thr790Met variant only), EPCAM (Deletion/duplication testing only), FH, FLCN, GATA2, GPC3, GREM1 (Promoter region deletion/duplication testing only), HOXB13 (c.251G>A, p.Gly84Glu), HRAS, KIT, MAX, MEN1, MET, MITF (c.952G>A, p.Glu318Lys variant only), MLH1, MSH2, MSH3, MSH6, MUTYH, NBN, NF1, NF2, NTHL1, PALB2, PDGFRA, PHOX2B, PMS2, POLD1, POLE, POT1, PRKAR1A, PTCH1, PTEN, RAD50, RAD51C, RAD51D, RB1, RECQL4, RET, RUNX1, SDHAF2, SDHA (sequence changes only), SDHB, SDHC, SDHD, SMAD4, SMARCA4, SMARCB1, SMARCE1, STK11, SUFU, TERT, TERT, TMEM127, TP53, TSC1, TSC2, VHL, WRN and WT1. The report date is November 14, 2017. ? ?  ? ? ?CHIEF COMPLIANT: Follow-up for left breast cancer ? ? ?  INTERVAL HISTORY: Tracy Valentine is a 66 y.o. female here today for follow up of her left breast cancer.  Her last visit was on 07/06/2021.  ? ?Today she reports feeling good. She is taking letrozole at night, and she reports nausea in the mornings and afternoons as well as gaining 35 lbs since resuming letrozole. She reports bloating. She reports joint pains. She takes 325 mg tylenol a day for back pain. She is taking 3000 units and 2000 units of vitamin D alternating every other day.  ? ?REVIEW OF SYSTEMS:   ?Review of Systems  ?Constitutional:  Positive for unexpected weight change (+35 lbs). Negative for appetite change and fatigue.  ?Gastrointestinal:  Positive for abdominal pain, constipation and nausea.  ?Musculoskeletal:  Positive for back pain.  ?Neurological:  Positive for headaches.  ?Psychiatric/Behavioral:  Positive for sleep disturbance.   ?All other systems reviewed and are negative. ? ?I have reviewed the past medical history, past surgical history, social history and family history with the patient and they are unchanged from previous note. ? ? ?ALLERGIES:   ?is allergic to adhesive [tape], amoxicillin, darvocet [propoxyphene n-acetaminophen], doxycycline, erythromycin, shellfish allergy, zithromax [azithromycin], and latex. ? ? ?MEDICATIONS:  ?Current Outpatient Medications  ?Medication Sig Dispense Refill  ? ALPRAZolam (XANAX) 1 MG tablet Take 1 mg by mouth 4 (four) times daily as needed.    ? AMBIEN 10 MG tablet Take 1 tablet (10 mg total) by mouth at bedtime. 30 tablet 5  ? aspirin EC 81 MG tablet Take 81 mg by mouth at bedtime.    ? calcium carbonate (TUMS - DOSED IN MG ELEMENTAL CALCIUM) 500 MG chewable tablet Chew 2 tablets by mouth daily.     ? Cholecalciferol (VITAMIN D3) 2000 units TABS Take 2,000 Units by mouth daily.    ? clobetasol cream (TEMOVATE) 0.05 % SMARTSIG:Sparingly Topical Twice Daily    ? cyclobenzaprine (FLEXERIL) 5 MG tablet TAKE 1 TO 2 TABLETS BY MOUTH THREE TIMES DAILY AS NEEDED    ? diclofenac Sodium (VOLTAREN) 1 % GEL Apply 4 g topically 4 (four) times daily.    ?  esomeprazole (NEXIUM) 40 MG capsule Take 40 mg by mouth daily.    ? fluticasone (FLONASE) 50 MCG/ACT nasal spray Place 1 spray into both nostrils daily. May repeat dose in evening if needed    ? letrozole (FEMARA) 2.5 MG tablet TAKE 1 TABLET BY MOUTH DAILY 90 tablet 3  ? levothyroxine (SYNTHROID, LEVOTHROID) 100 MCG tablet Take 100 mcg by mouth daily before breakfast.   5  ? NURTEC 75 MG TBDP Take 1 tablet by mouth daily.    ? Omega 3-6-9 CAPS See admin instructions.    ? ondansetron (ZOFRAN) 4 MG tablet TAKE 1 TABLET BY MOUTH TWICE DAILY AS NEEDED FOR NAUSEA AND VOMITING 90 tablet 6  ? polyethylene glycol powder (GLYCOLAX/MIRALAX) powder Take 17 g by mouth 2 (two) times daily.    ? simvastatin (ZOCOR) 20 MG tablet Take 20 mg by mouth at bedtime.     ? ?No current facility-administered medications for this visit.  ? ?Facility-Administered Medications Ordered in Other Visits  ?Medication Dose Route Frequency Provider Last Rate Last Admin  ? sodium chloride flush (NS) 0.9 % injection 10 mL  10 mL Intravenous PRN Holley Bouche, NP   10 mL at 10/04/17 1111  ? ? ? ?PHYSICAL EXAMINATION: ?Performance status (ECOG): 1 - Symptomatic but completely ambulatory ? ?Vitals:  ? 01/11/22 1447  ?BP: 122/62  ?Pulse:  63  ?Resp: 18  ?Temp: 98.1 ?F (36.7 ?C)  ?SpO2: 95%  ? ?Wt Readings from Last 3 Encounters:  ?01/11/22 275 lb (124.7 kg)  ?12/03/18 245 lb 9.6 oz (111.4 kg)  ?09/23/18 244 lb (110.7 kg)  ? ?Physical Exam ?Vitals reviewed.  ?Constitutional:   ?   Appearance: Normal appearance.  ?Cardiovascular:  ?   Rate and Rhythm: Normal rate and regular rhythm.  ?   Pulses: Normal pulses.  ?   Heart sounds: Normal heart sounds.  ?Pulmonary:  ?   Effort: Pulmonary effort is normal.  ?   Breath sounds: Normal breath sounds.  ?Chest:  ?Breasts: ?   Right: Normal. No swelling, bleeding, inverted nipple, mass, nipple discharge, skin change or tenderness.  ?   Left: No swelling, bleeding, inverted nipple, mass, nipple discharge, skin  change (lumpectomy scar around areola) or tenderness.  ?Abdominal:  ?   General: A surgical scar is present.  ?   Palpations: Abdomen is soft. There is no hepatomegaly, splenomegaly or mass.  ?   Tenderness: There i

## 2022-01-11 NOTE — Patient Instructions (Addendum)
[place] at [facility] ?Discharge Instructions ? ?You were seen and examined today by Dr. Delton Coombes. He reviewed your most recent labs and everything looks okay except your Vitamin D is slightly low. Continue taking Vitamin D and Calcium daily. Dr. Delton Coombes is switching the Anastrozole to Tamoxifen. Please keep follow up appointments as scheduled in 1 year. ? ? ?Thank you for choosing [place] at [facility] to provide your oncology and hematology care.  To afford each patient quality time with our provider, please arrive at least 15 minutes before your scheduled appointment time.  ? ?If you have a lab appointment with the [facility] please come in thru the Main Entrance and check in at the main information desk. ? ?You need to re-schedule your appointment should you arrive 10 or more minutes late.  We strive to give you quality time with our providers, and arriving late affects you and other patients whose appointments are after yours.  Also, if you no show three or more times for appointments you may be dismissed from the clinic at the providers discretion.     ?Again, thank you for choosing [facility].  Our hope is that these requests will decrease the amount of time that you wait before being seen by our physicians.       ?_____________________________________________________________ ? ?Should you have questions after your visit to [facility], please contact our office at [phone] and follow the prompts.  Our office hours are 8:00 a.m. and 4:30 p.m. Monday - Friday.  Please note that voicemails left after 4:00 p.m. may not be returned until the following business day.  We are closed weekends and major holidays.  You do have access to a nurse 24-7, just call the main number to the clinic [phone] and do not press any options, hold on the line and a nurse will answer the phone.   ? ?For prescription refill requests, have  your pharmacy contact our office and allow 72 hours.   ? ?Due to Covid, you will need to wear a mask upon entering the hospital. If you do not have a mask, a mask will be given to you at the Main Entrance upon arrival. For doctor visits, patients may have 1 support person age 72 or older with them. For treatment visits, patients can not have anyone with them due to social distancing guidelines and our immunocompromised population.  ? ?  ?

## 2022-01-27 ENCOUNTER — Ambulatory Visit
Admission: RE | Admit: 2022-01-27 | Discharge: 2022-01-27 | Disposition: A | Discharge status: Home / Self Care | Payer: Medicare Other | Source: Ambulatory Visit | Attending: Hematology | Admitting: Hematology

## 2022-01-27 DIAGNOSIS — Z853 Personal history of malignant neoplasm of breast: Secondary | ICD-10-CM

## 2022-01-27 DIAGNOSIS — R922 Inconclusive mammogram: Secondary | ICD-10-CM | POA: Diagnosis not present

## 2022-01-27 HISTORY — DX: Personal history of irradiation: Z92.3

## 2022-02-27 ENCOUNTER — Other Ambulatory Visit (HOSPITAL_COMMUNITY): Payer: Self-pay

## 2022-02-27 DIAGNOSIS — R110 Nausea: Secondary | ICD-10-CM

## 2022-02-27 MED ORDER — TAMOXIFEN CITRATE 20 MG PO TABS: 20.0000 mg | ORAL_TABLET | Freq: Every day | ORAL | 11 refills | Status: DC

## 2022-02-27 MED ORDER — ONDANSETRON HCL 4 MG PO TABS: ORAL_TABLET | ORAL | 6 refills | Status: AC

## 2022-02-27 NOTE — Telephone Encounter (Signed)
Per Dr Tomie China last office visit note he is changing patients medication to Tamoxifen 20 mg daily, and to notify office if patient is not able to tolerate medication. Patient also asked for refill on Ondansetron 4 mg and was refilled per patients request. ? ?

## 2022-03-21 ENCOUNTER — Other Ambulatory Visit (HOSPITAL_COMMUNITY): Payer: Self-pay

## 2022-03-21 DIAGNOSIS — C50412 Malignant neoplasm of upper-outer quadrant of left female breast: Secondary | ICD-10-CM

## 2022-04-10 ENCOUNTER — Other Ambulatory Visit (HOSPITAL_COMMUNITY): Payer: Medicare Other

## 2022-04-10 ENCOUNTER — Other Ambulatory Visit: Payer: Medicare Other

## 2022-05-08 ENCOUNTER — Telehealth (HOSPITAL_COMMUNITY): Payer: Self-pay | Admitting: *Deleted

## 2022-05-08 NOTE — Telephone Encounter (Signed)
Opened in error

## 2022-05-09 ENCOUNTER — Telehealth (HOSPITAL_COMMUNITY): Payer: Self-pay | Admitting: *Deleted

## 2022-05-09 NOTE — Telephone Encounter (Signed)
Patient called stating that her chronic abdominal pain and nausea has worsened and would like a CT completed.  Due to the nature of symptoms she was advised to contact her gastroenterologist.  She is seen in our office for breast cancer surveillance.  Verbalized understanding.

## 2022-05-11 ENCOUNTER — Encounter (INDEPENDENT_AMBULATORY_CARE_PROVIDER_SITE_OTHER): Payer: Self-pay | Admitting: *Deleted

## 2022-05-29 ENCOUNTER — Ambulatory Visit (INDEPENDENT_AMBULATORY_CARE_PROVIDER_SITE_OTHER): Payer: Medicare Other | Admitting: Gastroenterology

## 2022-05-29 ENCOUNTER — Encounter (INDEPENDENT_AMBULATORY_CARE_PROVIDER_SITE_OTHER): Payer: Self-pay | Admitting: Gastroenterology

## 2022-05-29 ENCOUNTER — Other Ambulatory Visit (INDEPENDENT_AMBULATORY_CARE_PROVIDER_SITE_OTHER): Payer: Self-pay

## 2022-05-29 VITALS — BP 155/85 | HR 65 | Temp 98.0°F | Ht 66.0 in | Wt 265.1 lb

## 2022-05-29 DIAGNOSIS — R1031 Right lower quadrant pain: Secondary | ICD-10-CM

## 2022-05-29 DIAGNOSIS — R109 Unspecified abdominal pain: Secondary | ICD-10-CM | POA: Insufficient documentation

## 2022-05-29 DIAGNOSIS — Z01812 Encounter for preprocedural laboratory examination: Secondary | ICD-10-CM

## 2022-05-29 NOTE — Progress Notes (Signed)
Tracy Valentine, M.D. Gastroenterology & Hepatology [facility] [address] [place], [place] [address] Primary Care Physician: Sharilyn Sites, [place] [address]  Referring MD: Berniece Salines  Chief Complaint:  R flank pain.  History of Present Illness: Tracy Valentine is a 66 y.o. female with past medical history of left ER positive breast cancer status posttreatment with RTX, letrozole and tamoxifen but now off treatment, anxiety, depression, hyperlipidemia, hypothyroidism, obesity, spinal stenosis, OSA, GERD, who presents for evaluation of R flank pain.  Patient reports that during the week of July 4th 2023 she presented exacerbation of abdominal pain in the R flank. She stated the pain was significant and was constant. She was scared as she felt the pain was "pulling" the scar of her cholecystectomy. She also reports that she had some lower "menstrual like" cramping during that week. There were no triggers. The pain subsided on its own a little over a week, only took Tylenol and ibuprofen a few times. She also states feeling very bloated at the time her symptoms were present. She believes she had low degree pain in her R flank in March but it was very mild.  She also reports chronic nausea since she was started on treatment for breast cancer. States she is still taking Zofran once a day as needed, reports that nausea has been improving as she is off treatment of breast cancer. No vomiting.  The patient denies having any fever, chills, hematochezia, melena, hematemesis, diarrhea, jaundice, pruritus or recent weight loss.  She takes a stool softener and Miralax daily, has a BM almost every day.  Most recent abdominal imaging was a CT of the abdomen and pelvis with IV contrast on 2018 which was unremarkable.  Last FUX:NATFT Last Colonoscopy:Dr. Medoff in 2018 - patient reports that she had 5 polyps.  Pathology unknown Does not know when she has to repeat her next colonoscopy.  FHx: neg for any gastrointestinal/liver disease, nephew stomach cancer, ovarian  Social: neg smoking, alcohol or illicit drug use Surgical: cholecystectomy, prior hemorrhoidal banding, hysterectomy  Past Medical History: Past Medical History:  Diagnosis Date   Anxiety    Breast cancer ([place]) 2017   Left Breast   Chronic pain    DDD (degenerative disc disease), cervical    DDD (degenerative disc disease), lumbar    Degenerative disc disease at L5-S1 level    Depression    GERD (gastroesophageal reflux disease)    History of kidney stones    Hyperlipidemia    Hypothyroidism    Obesity    Osteoarthritis    Ovarian cancer ([place]) 2017   Left Breast Cancer   Personal history of chemotherapy 2017   Left Breast Cancer   Personal history of radiation therapy    PONV (postoperative nausea and vomiting)    Sleep apnea    uses CPAP   Spinal stenosis     Past Surgical History: Past Surgical History:  Procedure Laterality Date   ABDOMINAL HYSTERECTOMY     partial hyst, still have ovaries   BREAST BIOPSY Left 10/20/2020   BREAST LUMPECTOMY Left 08/24/2016   BREAST LUMPECTOMY WITH RADIOACTIVE SEED AND SENTINEL LYMPH NODE BIOPSY Left 08/24/2016   Procedure: LEFT BREAST PARTIAL MASTECTOMY WITH RADIOACTIVE SEED AND LEFT SENTINEL LYMPH NODE MAPPING;  Surgeon: Erroll Luna, MD;  Location: [facility];  Service: General;  Laterality: Left;   CHOLECYSTECTOMY     FOOT FOREIGN BODY REMOVAL Left  PORT-A-CATH REMOVAL N/A 03/06/2018   Procedure: MINOR REMOVAL PORT-A-CATH;  Surgeon: Aviva Signs, MD;  Location: [facility];  Service: General;  Laterality: N/A;   PORTACATH PLACEMENT Right 09/29/2016   Procedure: INSERTION PORT-A-CATH RIGHT SUBCLAVIAN;  Surgeon: Aviva Signs, MD;  Location: [facility];  Service: General;  Laterality: Right;    Family History: Family History  Problem Relation Age of Onset    COPD Mother    Ovarian cancer Mother 62   Uterine cancer Mother 75   Heart attack Father 7   Diabetes Sister    Heart disease Sister    Kidney cancer Brother 34   Breast cancer Cousin    Kidney failure Sister    Thyroid disease Sister    Ovarian cancer Maternal Grandmother 77   Cancer Paternal Aunt        NOS   Cancer Paternal Uncle        NOS   Colon cancer Other        2 distant cousins with colon cancer in their 66s    Social History: Social History   Tobacco Use  Smoking Status Never  Smokeless Tobacco Never   Social History   Substance and Sexual Activity  Alcohol Use No   Social History   Substance and Sexual Activity  Drug Use No    Allergies: Allergies  Allergen Reactions   Adhesive [Tape]     Some adhesives cause rash, redness, blister (not all) Patient tolerates PAPER TAPE   Amoxicillin Hives    Has patient had a PCN reaction causing immediate rash, facial/tongue/throat swelling, SOB or lightheadedness with hypotension: No Has patient had a PCN reaction causing severe rash involving mucus membranes or skin necrosis: Yes Has patient had a PCN reaction that required hospitalization: No Has patient had a PCN reaction occurring within the last 10 years: Yes If all of the above answers are "NO", then may proceed with Cephalosporin use.    Darvocet [Propoxyphene N-Acetaminophen] Other (See Comments)    nightmares   Doxycycline Hives   Erythromycin Nausea And Vomiting    Severe GI issues   Shellfish Allergy Nausea And Vomiting   Zithromax [Azithromycin] Other (See Comments)    Vomiting, diarrhea   Latex Rash    Medications: Current Outpatient Medications  Medication Sig Dispense Refill   ALPRAZolam (XANAX) 1 MG tablet Take 1 mg by mouth 4 (four) times daily as needed.     AMBIEN 10 MG tablet Take 1 tablet (10 mg total) by mouth at bedtime. 30 tablet 5   aspirin EC 81 MG tablet Take 81 mg by mouth at bedtime.     calcium carbonate (TUMS - DOSED IN  MG ELEMENTAL CALCIUM) 500 MG chewable tablet Chew 2 tablets by mouth daily.      Cholecalciferol (VITAMIN D3) 2000 units TABS Take 3,000 Units by mouth daily.     docusate sodium (COLACE) 100 MG capsule Take 100 mg by mouth 2 (two) times daily.     esomeprazole (NEXIUM) 40 MG capsule Take 40 mg by mouth daily.     fluticasone (FLONASE) 50 MCG/ACT nasal spray Place 1 spray into both nostrils daily. May repeat dose in evening if needed     levothyroxine (SYNTHROID, LEVOTHROID) 100 MCG tablet Take 100 mcg by mouth daily before breakfast.   5   Omega 3-6-9 CAPS See admin instructions. daily     ondansetron (ZOFRAN) 4 MG tablet TAKE 1 TABLET BY MOUTH TWICE DAILY AS NEEDED FOR NAUSEA AND VOMITING 90  tablet 6   polyethylene glycol powder (GLYCOLAX/MIRALAX) powder Take 17 g by mouth 2 (two) times daily.     simvastatin (ZOCOR) 20 MG tablet Take 20 mg by mouth at bedtime.      NURTEC 75 MG TBDP Take 1 tablet by mouth daily. (Patient not taking: Reported on 05/29/2022)     No current facility-administered medications for this visit.   Facility-Administered Medications Ordered in Other Visits  Medication Dose Route Frequency Provider Last Rate Last Admin   sodium chloride flush (NS) 0.9 % injection 10 mL  10 mL Intravenous PRN Holley Bouche, NP   10 mL at 10/04/17 1111    Review of Systems: GENERAL: negative for malaise, night sweats HEENT: No changes in hearing or vision, no nose bleeds or other nasal problems. NECK: Negative for lumps, goiter, pain and significant neck swelling RESPIRATORY: Negative for cough, wheezing CARDIOVASCULAR: Negative for chest pain, leg swelling, palpitations, orthopnea GI: SEE HPI MUSCULOSKELETAL: Negative for joint pain or swelling, back pain, and muscle pain. SKIN: Negative for lesions, rash PSYCH: Negative for sleep disturbance, mood disorder and recent psychosocial stressors. HEMATOLOGY Negative for prolonged bleeding, bruising easily, and swollen  nodes. ENDOCRINE: Negative for cold or heat intolerance, polyuria, polydipsia and goiter. NEURO: negative for tremor, gait imbalance, syncope and seizures. The remainder of the review of systems is noncontributory.   Physical Exam: BP (!) 155/85 (BP Location: Right Arm, Patient Position: Sitting, Cuff Size: Large)   Pulse 65   Temp 98 F (36.7 C) (Oral)   Ht '5\' 6"'$  (1.676 m)   Wt 265 lb 1.6 oz (120.2 kg)   BMI 42.79 kg/m  GENERAL: The patient is AO x3, in no acute distress. Obese HEENT: Head is normocephalic and atraumatic. EOMI are intact. Mouth is well hydrated and without lesions. NECK: Supple. No masses LUNGS: Clear to auscultation. No presence of rhonchi/wheezing/rales. Adequate chest expansion HEART: RRR, normal s1 and s2. ABDOMEN: tender to palpation in the R flank, no guarding, no peritoneal signs, and nondistended. BS +. No masses. EXTREMITIES: Without any cyanosis, clubbing, rash, lesions or edema. NEUROLOGIC: AOx3, no focal motor deficit. SKIN: no jaundice, no rashes   Imaging/Labs: as above  I personally reviewed and interpreted the available labs, imaging and endoscopic files.  Impression and Plan: KLOI BRODMAN is a 66 y.o. female with past medical history of left ER positive breast cancer status posttreatment with RTX, letrozole and tamoxifen but now off treatment, anxiety, depression, hyperlipidemia, hypothyroidism, obesity, spinal stenosis, OSA, GERD, who presents for evaluation of R flank pain.  The patient had a transient episode of pain in her abdomen which was severe in nature but fortunately was self-limited.  She did not have any other associated symptoms.  We will explore further her symptomatology with a CT of the abdomen pelvis with IV contrast given her prior history of malignancy.  Fortunately, as she has presented improvement of her symptoms, no further management is warranted at this point but if she had recurrence of the severe pain, she will need to  let us know to send prescription for antispasmodics.  Finally, it is unclear if she had multiple polyps during her last colonoscopy.  We will request records of her most recent procedure.  - Schedule CT abdomen/pelvis with IV contrast - If recurrent pain, patient will call to the office so we can prescribe a muscle relaxant - Will request colonoscopy records from previous GI  All questions were answered.      Tracy Peppers,  MD Gastroenterology and Hepatology [facility]

## 2022-05-29 NOTE — Patient Instructions (Signed)
Schedule CT abdomen/pelvis with IV contrast If recurrent pain, please call to the office so we can prescribe a muscle relaxant If severe pain in between the visit and the CT scan, may need to be evaluated in the ER with an urgent CT Will request colonoscopy records from previous GI

## 2022-05-31 ENCOUNTER — Telehealth (INDEPENDENT_AMBULATORY_CARE_PROVIDER_SITE_OTHER): Payer: Self-pay | Admitting: Gastroenterology

## 2022-05-31 DIAGNOSIS — M159 Polyosteoarthritis, unspecified: Secondary | ICD-10-CM | POA: Diagnosis not present

## 2022-05-31 DIAGNOSIS — E7849 Other hyperlipidemia: Secondary | ICD-10-CM | POA: Diagnosis not present

## 2022-05-31 DIAGNOSIS — K635 Polyp of colon: Secondary | ICD-10-CM | POA: Diagnosis not present

## 2022-05-31 DIAGNOSIS — G4700 Insomnia, unspecified: Secondary | ICD-10-CM | POA: Diagnosis not present

## 2022-05-31 DIAGNOSIS — E039 Hypothyroidism, unspecified: Secondary | ICD-10-CM | POA: Diagnosis not present

## 2022-05-31 DIAGNOSIS — D0592 Unspecified type of carcinoma in situ of left breast: Secondary | ICD-10-CM | POA: Diagnosis not present

## 2022-05-31 NOTE — Telephone Encounter (Signed)
I received the results of the most recent colonoscopy performed by Dr. Richmond Campbell on 09/24/2017.  Patient had a total of 3 polyps removed in the ascending colon measuring between 5 to 10 mm which were completely resected, there was presence of melanosis coli.  No pathology report is available.  Will recommend repeating a colonoscopy for history of colonic polyps in December 2023.

## 2022-06-02 ENCOUNTER — Telehealth (INDEPENDENT_AMBULATORY_CARE_PROVIDER_SITE_OTHER): Payer: Self-pay | Admitting: *Deleted

## 2022-06-02 NOTE — Telephone Encounter (Signed)
I received the results of the most recent blood work-up performed on 05/31/2022 which showed a CBC with white blood cell count of 6.4, hemoglobin 15.4, MCV of 99, platelets 185, CMP showing a glucose of 438, creatinine 1.0, BUN 15, calcium 10.5, sodium 136, potassium 4.9, alkaline phosphatase 186, AST 31, ALT 32, albumin 4.3, TSH 2.6.

## 2022-06-02 NOTE — Telephone Encounter (Signed)
Patient seen on 05/29/22 and you ordered creatinine. She had labs done on 05/31/22 ordered by Dr. Hilma Favors and copy of labs on your desk for review.

## 2022-06-02 NOTE — Telephone Encounter (Signed)
Thanks, will review them later today.  Creatinine was ordered prior to undergoing CT scan.

## 2022-06-21 DIAGNOSIS — Z6841 Body Mass Index (BMI) 40.0 and over, adult: Secondary | ICD-10-CM | POA: Diagnosis not present

## 2022-06-21 DIAGNOSIS — D0592 Unspecified type of carcinoma in situ of left breast: Secondary | ICD-10-CM | POA: Diagnosis not present

## 2022-06-21 DIAGNOSIS — E1165 Type 2 diabetes mellitus with hyperglycemia: Secondary | ICD-10-CM | POA: Diagnosis not present

## 2022-06-22 ENCOUNTER — Ambulatory Visit (HOSPITAL_COMMUNITY)
Admission: RE | Admit: 2022-06-22 | Discharge: 2022-06-22 | Disposition: A | Discharge status: Home / Self Care | Payer: Medicare Other | Source: Ambulatory Visit | Attending: Gastroenterology | Admitting: Gastroenterology

## 2022-06-22 DIAGNOSIS — R1031 Right lower quadrant pain: Secondary | ICD-10-CM | POA: Insufficient documentation

## 2022-06-22 LAB — POCT I-STAT CREATININE: Creatinine, Ser: 0.9 mg/dL (ref 0.44–1.00)

## 2022-06-22 MED ORDER — IOHEXOL 300 MG/ML  SOLN
100.0000 mL | Freq: Once | INTRAMUSCULAR | Status: AC | PRN
  Administered 2022-06-22: 100 mL via INTRAVENOUS

## 2022-07-03 ENCOUNTER — Ambulatory Visit (HOSPITAL_COMMUNITY): Payer: Medicare Other

## 2022-07-17 DIAGNOSIS — Z1331 Encounter for screening for depression: Secondary | ICD-10-CM | POA: Diagnosis not present

## 2022-07-17 DIAGNOSIS — Z0001 Encounter for general adult medical examination with abnormal findings: Secondary | ICD-10-CM | POA: Diagnosis not present

## 2022-07-17 DIAGNOSIS — Z6841 Body Mass Index (BMI) 40.0 and over, adult: Secondary | ICD-10-CM | POA: Diagnosis not present

## 2022-07-17 DIAGNOSIS — E1165 Type 2 diabetes mellitus with hyperglycemia: Secondary | ICD-10-CM | POA: Diagnosis not present

## 2022-08-03 ENCOUNTER — Ambulatory Visit (INDEPENDENT_AMBULATORY_CARE_PROVIDER_SITE_OTHER): Payer: Medicare Other | Admitting: Gastroenterology

## 2022-08-21 ENCOUNTER — Ambulatory Visit: Payer: Medicare Other | Admitting: Nutrition

## 2022-08-28 ENCOUNTER — Ambulatory Visit: Payer: Medicare Other | Admitting: Nutrition

## 2022-09-13 ENCOUNTER — Encounter: Payer: Medicare Other | Attending: Family Medicine | Admitting: Nutrition

## 2022-09-13 VITALS — Ht 66.0 in | Wt 267.0 lb

## 2022-09-13 DIAGNOSIS — E118 Type 2 diabetes mellitus with unspecified complications: Secondary | ICD-10-CM | POA: Insufficient documentation

## 2022-09-13 DIAGNOSIS — I10 Essential (primary) hypertension: Secondary | ICD-10-CM | POA: Diagnosis not present

## 2022-09-13 DIAGNOSIS — Z853 Personal history of malignant neoplasm of breast: Secondary | ICD-10-CM | POA: Diagnosis not present

## 2022-09-13 NOTE — Progress Notes (Signed)
Medical Nutrition Therapy  Appointment Start time:  [phone]  Appointment End time:  30  Primary concerns today: Dm Type 2 , Obesity  Referral diagnosis: E11.8, E66.9 Preferred learning style: No preference  Learning readiness: Contemplation    NUTRITION ASSESSMENT  66 yr old wfemale her for DM Type 2 DM and Obesity. PMH: Breast cancer-invasive ductal carcinoma, s/p lumpectomy,  Hypothyroid, GERD, Hyperlipidemia, OSA,Spinal Stensosis.  A1C 12.2 % 8/23 Sees Dr. Sharilyn Sites.  BMI 43. Has trouble sleeping a lot. Can't sleep for long periods of time. Currently taking Glimepiride, Janumet(Can't afford anymore) She has been trying to eat better recently. Dexcom FBS:100-120's  Bedtime: 60-100's.  Blood sugars are much improved. Seh bought her out meter and has been testing and using the Dexcom.  She is willing to work on lifestyle Medicine to improve her health and reverse her DM.    Latest Ref Rng & Units 06/22/2022    9:15 AM 01/04/2022   12:51 PM 06/29/2021    1:31 PM  CMP  Glucose 70 - 99 mg/dL  308  96   BUN 8 - 23 mg/dL  19  16   Creatinine 0.44 - 1.00 mg/dL 0.90  0.83  0.73   Sodium 135 - 145 mmol/L  135  139   Potassium 3.5 - 5.1 mmol/L  4.0  4.1   Chloride 98 - 111 mmol/L  105  106   CO2 22 - 32 mmol/L  23  28   Calcium 8.9 - 10.3 mg/dL  9.7  10.1   Total Protein 6.5 - 8.1 g/dL  7.5  7.5   Total Bilirubin 0.3 - 1.2 mg/dL  1.0  0.6   Alkaline Phos 38 - 126 U/L  162  117   AST 15 - 41 U/L  40  29   ALT 0 - 44 U/L  44  26      Anthropometrics  Wt Readings from Last 3 Encounters:  09/13/22 267 lb (121.1 kg)  05/29/22 265 lb 1.6 oz (120.2 kg)  01/11/22 275 lb (124.7 kg)   Ht Readings from Last 3 Encounters:  09/13/22 '5\' 6"'$  (1.676 m)  05/29/22 '5\' 6"'$  (1.676 m)  09/23/18 '5\' 8"'$  (1.727 m)   Body mass index is 43.09 kg/m. '@BMIFA'$ @ Facility age limit for growth %iles is 20 years. Facility age limit for growth %iles is 20 years.    Clinical Medical Hx: See  chart Medications: Janumet and Glimiperide. Labs: 12.3%. Notable Signs/Symptoms:   Lifestyle & Dietary Hx Married. She cooks and shops. On disability  Estimated daily fluid intake: 4/5 bottles of water per day (16  oz) Supplements: Calcium, Omega 3's, Vit D Sleep:  4-5 hrs Stress / self-care: none Current average weekly physical activity: walking some  24-Hr Dietary Recall First Meal: skips;  Oatmeal with peaches and cream, Water or soda Snack:  Second Meal: Nature conservation officer,  Water Snack:  Third Meal: Ham sandwich on wheat bread, apple, water or Dinner Grilled chicken salad with halo.Water Snack: Apple Beverages: water ,soda-sprite zero  Estimated Energy Needs Calories: 1200 Carbohydrate: 135g Protein: 90g Fat: 33g   NUTRITION DIAGNOSIS  NB-1.1 Food and nutrition-related knowledge deficit As related to Diabetes Type 2.  As evidenced by A1c 12.2%.[name] Kitchen   NUTRITION INTERVENTION  Nutrition education (E-1) on the following topics:  Nutrition and Diabetes education provided on My Plate, CHO counting, meal planning, portion sizes, timing of meals, avoiding snacks between meals unless having a low blood sugar, target ranges for A1C and  blood sugars, signs/symptoms and treatment of hyper/hypoglycemia, monitoring blood sugars, taking medications as prescribed, benefits of exercising 30 minutes per day and prevention of complications of DM. Lifestyle Medicine  - Whole Food, Plant Predominant Nutrition is highly recommended: Eat Plenty of vegetables, Mushrooms, fruits, Legumes, Whole Grains, Nuts, seeds in lieu of processed meats, processed snacks/pastries red meat, poultry, eggs.    -It is better to avoid simple carbohydrates including: Cakes, Sweet Desserts, Ice Cream, Soda (diet and regular), Sweet Tea, Candies, Chips, Cookies, Store Bought Juices, Alcohol in Excess of  1-2 drinks a day, Lemonade,  Artificial Sweeteners, Doughnuts, Coffee Creamers, "Sugar-free" Products, etc, etc.  This  is not a complete list.....  Exercise: If you are able: 30 -60 minutes a day ,4 days a week, or 150 minutes a week.  The longer the better.  Combine stretch, strength, and aerobic activities.  If you were told in the past that you have high risk for cardiovascular diseases, you may seek evaluation by your heart doctor prior to initiating moderate to intense exercise programs.   Handouts Provided Include  Nutrition and Diabetes education provided on My Plate, CHO counting, meal planning, portion sizes, timing of meals, avoiding snacks between meals unless having a low blood sugar, target ranges for A1C and blood sugars, signs/symptoms and treatment of hyper/hypoglycemia, monitoring blood sugars, taking medications as prescribed, benefits of exercising 30 minutes per day and prevention of complications of DM. Lifestyle Medicine  - Whole Food, Plant Predominant Nutrition is highly recommended: Eat Plenty of vegetables, Mushrooms, fruits, Legumes, Whole Grains, Nuts, seeds in lieu of processed meats, processed snacks/pastries red meat, poultry, eggs.    -It is better to avoid simple carbohydrates including: Cakes, Sweet Desserts, Ice Cream, Soda (diet and regular), Sweet Tea, Candies, Chips, Cookies, Store Bought Juices, Alcohol in Excess of  1-2 drinks a day, Lemonade,  Artificial Sweeteners, Doughnuts, Coffee Creamers, "Sugar-free" Products, etc, etc.  This is not a complete list.....  Exercise: If you are able: 30 -60 minutes a day ,4 days a week, or 150 minutes a week.  The longer the better.  Combine stretch, strength, and aerobic activities.  If you were told in the past that you have high risk for cardiovascular diseases, you may seek evaluation by your heart doctor prior to initiating moderate to intense exercise programs.   Learning Style & Readiness for Change Teaching method utilized: Visual & Auditory  Demonstrated degree of understanding via: Teach Back  Barriers to learning/adherence to  lifestyle change: none  Goals Established by Pt Goals  Eat whole food plant based meals Cut out processed foods, fast foods and junk food Drink only water Get A1C down to 7% Don't eat past 7 pm.   MONITORING & EVALUATION Dietary intake, weekly physical activity, and BS"s in 1 month.  Next Steps  Patient is to work on meal planning using more plant based foods.[name] Kitchen

## 2022-09-19 ENCOUNTER — Encounter: Payer: Self-pay | Admitting: Nutrition

## 2022-09-19 NOTE — Patient Instructions (Signed)
Goals  Eat whole food plant based meals Cut out processed foods, fast foods and junk food Drink only water Get A1C down to 7% Don't eat past 7 pm.

## 2022-09-20 DIAGNOSIS — Z6841 Body Mass Index (BMI) 40.0 and over, adult: Secondary | ICD-10-CM | POA: Diagnosis not present

## 2022-09-20 DIAGNOSIS — E1165 Type 2 diabetes mellitus with hyperglycemia: Secondary | ICD-10-CM | POA: Diagnosis not present

## 2022-11-08 ENCOUNTER — Ambulatory Visit: Payer: Medicare Other | Admitting: Nutrition

## 2022-12-19 DIAGNOSIS — K080 Exfoliation of teeth due to systemic causes: Secondary | ICD-10-CM | POA: Diagnosis not present

## 2023-01-12 ENCOUNTER — Other Ambulatory Visit (HOSPITAL_COMMUNITY): Payer: Medicare Other

## 2023-01-18 DIAGNOSIS — G4730 Sleep apnea, unspecified: Secondary | ICD-10-CM | POA: Diagnosis not present

## 2023-01-18 DIAGNOSIS — G894 Chronic pain syndrome: Secondary | ICD-10-CM | POA: Diagnosis not present

## 2023-01-18 DIAGNOSIS — Z6841 Body Mass Index (BMI) 40.0 and over, adult: Secondary | ICD-10-CM | POA: Diagnosis not present

## 2023-01-18 DIAGNOSIS — E039 Hypothyroidism, unspecified: Secondary | ICD-10-CM | POA: Diagnosis not present

## 2023-01-18 DIAGNOSIS — E782 Mixed hyperlipidemia: Secondary | ICD-10-CM | POA: Diagnosis not present

## 2023-01-18 DIAGNOSIS — F419 Anxiety disorder, unspecified: Secondary | ICD-10-CM | POA: Diagnosis not present

## 2023-01-18 DIAGNOSIS — E1159 Type 2 diabetes mellitus with other circulatory complications: Secondary | ICD-10-CM | POA: Diagnosis not present

## 2023-01-18 DIAGNOSIS — E7849 Other hyperlipidemia: Secondary | ICD-10-CM | POA: Diagnosis not present

## 2023-01-19 ENCOUNTER — Ambulatory Visit (HOSPITAL_COMMUNITY): Payer: Medicare Other | Admitting: Physician Assistant

## 2023-02-05 ENCOUNTER — Other Ambulatory Visit: Payer: Self-pay

## 2023-02-07 ENCOUNTER — Ambulatory Visit
Admission: RE | Admit: 2023-02-07 | Discharge: 2023-02-07 | Disposition: A | Discharge status: Home / Self Care | Payer: Medicare Other | Source: Ambulatory Visit | Attending: Hematology | Admitting: Hematology

## 2023-02-07 DIAGNOSIS — Z170 Estrogen receptor positive status [ER+]: Secondary | ICD-10-CM

## 2023-02-07 DIAGNOSIS — Z1231 Encounter for screening mammogram for malignant neoplasm of breast: Secondary | ICD-10-CM | POA: Diagnosis not present

## 2023-02-12 ENCOUNTER — Ambulatory Visit: Payer: Medicare Other | Admitting: Physician Assistant

## 2023-07-31 DIAGNOSIS — J309 Allergic rhinitis, unspecified: Secondary | ICD-10-CM | POA: Diagnosis not present

## 2023-07-31 DIAGNOSIS — J329 Chronic sinusitis, unspecified: Secondary | ICD-10-CM | POA: Diagnosis not present

## 2023-08-09 DIAGNOSIS — J329 Chronic sinusitis, unspecified: Secondary | ICD-10-CM | POA: Diagnosis not present

## 2023-08-09 DIAGNOSIS — F419 Anxiety disorder, unspecified: Secondary | ICD-10-CM | POA: Diagnosis not present

## 2023-08-09 DIAGNOSIS — Z1331 Encounter for screening for depression: Secondary | ICD-10-CM | POA: Diagnosis not present

## 2023-08-09 DIAGNOSIS — G4730 Sleep apnea, unspecified: Secondary | ICD-10-CM | POA: Diagnosis not present

## 2023-08-09 DIAGNOSIS — Z0001 Encounter for general adult medical examination with abnormal findings: Secondary | ICD-10-CM | POA: Diagnosis not present

## 2023-08-09 DIAGNOSIS — J309 Allergic rhinitis, unspecified: Secondary | ICD-10-CM | POA: Diagnosis not present

## 2023-08-09 DIAGNOSIS — E7849 Other hyperlipidemia: Secondary | ICD-10-CM | POA: Diagnosis not present

## 2023-08-09 DIAGNOSIS — Z6841 Body Mass Index (BMI) 40.0 and over, adult: Secondary | ICD-10-CM | POA: Diagnosis not present

## 2023-08-09 DIAGNOSIS — E1159 Type 2 diabetes mellitus with other circulatory complications: Secondary | ICD-10-CM | POA: Diagnosis not present

## 2023-08-09 DIAGNOSIS — E063 Autoimmune thyroiditis: Secondary | ICD-10-CM | POA: Diagnosis not present

## 2023-08-09 DIAGNOSIS — E039 Hypothyroidism, unspecified: Secondary | ICD-10-CM | POA: Diagnosis not present

## 2023-08-09 DIAGNOSIS — M159 Polyosteoarthritis, unspecified: Secondary | ICD-10-CM | POA: Diagnosis not present

## 2023-08-09 DIAGNOSIS — E782 Mixed hyperlipidemia: Secondary | ICD-10-CM | POA: Diagnosis not present

## 2023-08-23 DIAGNOSIS — E782 Mixed hyperlipidemia: Secondary | ICD-10-CM | POA: Diagnosis not present

## 2023-08-23 DIAGNOSIS — E1159 Type 2 diabetes mellitus with other circulatory complications: Secondary | ICD-10-CM | POA: Diagnosis not present

## 2023-08-23 DIAGNOSIS — I10 Essential (primary) hypertension: Secondary | ICD-10-CM | POA: Diagnosis not present

## 2023-10-26 DIAGNOSIS — Z6841 Body Mass Index (BMI) 40.0 and over, adult: Secondary | ICD-10-CM | POA: Diagnosis not present

## 2023-10-26 DIAGNOSIS — F419 Anxiety disorder, unspecified: Secondary | ICD-10-CM | POA: Diagnosis not present

## 2023-10-26 DIAGNOSIS — J309 Allergic rhinitis, unspecified: Secondary | ICD-10-CM | POA: Diagnosis not present

## 2023-10-26 DIAGNOSIS — G4700 Insomnia, unspecified: Secondary | ICD-10-CM | POA: Diagnosis not present

## 2023-10-26 DIAGNOSIS — G4730 Sleep apnea, unspecified: Secondary | ICD-10-CM | POA: Diagnosis not present

## 2023-10-26 DIAGNOSIS — J329 Chronic sinusitis, unspecified: Secondary | ICD-10-CM | POA: Diagnosis not present

## 2024-01-02 ENCOUNTER — Telehealth: Payer: Self-pay | Admitting: *Deleted

## 2024-01-02 NOTE — Telephone Encounter (Signed)
 Patient called requesting order for diagnostic mammogram.  Per Dr. Ellin Saba, he would like for her to have a screening mammogram at this point. Patient states that she will call her PCP to see if they will order diagnostic testing and call us back if they are not willing to do so.

## 2024-01-03 DIAGNOSIS — H2513 Age-related nuclear cataract, bilateral: Secondary | ICD-10-CM | POA: Diagnosis not present

## 2024-01-03 DIAGNOSIS — H43811 Vitreous degeneration, right eye: Secondary | ICD-10-CM | POA: Diagnosis not present

## 2024-01-03 DIAGNOSIS — E119 Type 2 diabetes mellitus without complications: Secondary | ICD-10-CM | POA: Diagnosis not present

## 2024-01-05 DIAGNOSIS — R062 Wheezing: Secondary | ICD-10-CM | POA: Diagnosis not present

## 2024-01-05 DIAGNOSIS — J101 Influenza due to other identified influenza virus with other respiratory manifestations: Secondary | ICD-10-CM | POA: Diagnosis not present

## 2024-01-05 DIAGNOSIS — R509 Fever, unspecified: Secondary | ICD-10-CM | POA: Diagnosis not present

## 2024-01-14 ENCOUNTER — Other Ambulatory Visit: Payer: Self-pay | Admitting: Family Medicine

## 2024-01-14 DIAGNOSIS — Z853 Personal history of malignant neoplasm of breast: Secondary | ICD-10-CM

## 2024-02-11 ENCOUNTER — Other Ambulatory Visit: Payer: Self-pay | Admitting: Family Medicine

## 2024-02-11 ENCOUNTER — Ambulatory Visit
Admission: RE | Admit: 2024-02-11 | Discharge: 2024-02-11 | Disposition: A | Discharge status: Home / Self Care | Source: Ambulatory Visit | Attending: Family Medicine | Admitting: Family Medicine

## 2024-02-11 DIAGNOSIS — Z853 Personal history of malignant neoplasm of breast: Secondary | ICD-10-CM

## 2024-02-11 DIAGNOSIS — N6011 Diffuse cystic mastopathy of right breast: Secondary | ICD-10-CM | POA: Diagnosis not present

## 2024-02-11 DIAGNOSIS — Z08 Encounter for follow-up examination after completed treatment for malignant neoplasm: Secondary | ICD-10-CM | POA: Diagnosis not present

## 2024-08-06 DIAGNOSIS — Z23 Encounter for immunization: Secondary | ICD-10-CM | POA: Diagnosis not present

## 2024-08-06 DIAGNOSIS — Z0000 Encounter for general adult medical examination without abnormal findings: Secondary | ICD-10-CM | POA: Diagnosis not present

## 2024-08-06 DIAGNOSIS — E119 Type 2 diabetes mellitus without complications: Secondary | ICD-10-CM | POA: Diagnosis not present

## 2024-08-06 DIAGNOSIS — E785 Hyperlipidemia, unspecified: Secondary | ICD-10-CM | POA: Diagnosis not present

## 2024-08-06 DIAGNOSIS — I10 Essential (primary) hypertension: Secondary | ICD-10-CM | POA: Diagnosis not present

## 2024-08-06 DIAGNOSIS — G4733 Obstructive sleep apnea (adult) (pediatric): Secondary | ICD-10-CM | POA: Diagnosis not present

## 2024-08-06 DIAGNOSIS — E039 Hypothyroidism, unspecified: Secondary | ICD-10-CM | POA: Diagnosis not present

## 2024-08-20 DIAGNOSIS — L820 Inflamed seborrheic keratosis: Secondary | ICD-10-CM | POA: Diagnosis not present

## 2024-09-08 DIAGNOSIS — G4733 Obstructive sleep apnea (adult) (pediatric): Secondary | ICD-10-CM | POA: Diagnosis not present

## 2024-09-08 DIAGNOSIS — G2581 Restless legs syndrome: Secondary | ICD-10-CM | POA: Diagnosis not present

## 2024-09-08 DIAGNOSIS — G4700 Insomnia, unspecified: Secondary | ICD-10-CM | POA: Diagnosis not present
